# Patient Record
Sex: Female | Born: 1960
Health system: Southern US, Community
[De-identification: ages and names within clinical notes are randomized; demographics above are authoritative.]

## PROBLEM LIST (undated history)

## (undated) DIAGNOSIS — M5136 Other intervertebral disc degeneration, lumbar region: Secondary | ICD-10-CM

## (undated) DIAGNOSIS — C801 Malignant (primary) neoplasm, unspecified: Secondary | ICD-10-CM

## (undated) DIAGNOSIS — K529 Noninfective gastroenteritis and colitis, unspecified: Secondary | ICD-10-CM

## (undated) DIAGNOSIS — M51369 Other intervertebral disc degeneration, lumbar region without mention of lumbar back pain or lower extremity pain: Secondary | ICD-10-CM

## (undated) HISTORY — PX: CYST REMOVAL NECK: SHX6281

## (undated) HISTORY — PX: HAMMER TOE SURGERY: SHX385

## (undated) HISTORY — PX: TONSILLECTOMY AND ADENOIDECTOMY: SUR1326

## (undated) HISTORY — DX: Noninfective gastroenteritis and colitis, unspecified: K52.9

## (undated) HISTORY — DX: Other intervertebral disc degeneration, lumbar region: M51.36

## (undated) HISTORY — PX: BUNIONECTOMY: SHX129

## (undated) HISTORY — PX: ABDOMINAL HYSTERECTOMY: SHX81

## (undated) HISTORY — PX: BREAST CYST ASPIRATION: SHX578

## (undated) HISTORY — DX: Other intervertebral disc degeneration, lumbar region without mention of lumbar back pain or lower extremity pain: M51.369

---

## 2001-03-10 ENCOUNTER — Other Ambulatory Visit: Admission: RE | Admit: 2001-03-10 | Discharge: 2001-03-10 | Payer: Self-pay | Admitting: Family Medicine

## 2004-11-29 ENCOUNTER — Ambulatory Visit: Payer: Self-pay | Admitting: Family Medicine

## 2005-04-10 ENCOUNTER — Ambulatory Visit: Payer: Self-pay | Admitting: Family Medicine

## 2005-11-26 ENCOUNTER — Ambulatory Visit: Payer: Self-pay | Admitting: Podiatry

## 2008-06-19 LAB — HM DEXA SCAN: HM Dexa Scan: NORMAL

## 2008-06-21 ENCOUNTER — Ambulatory Visit: Payer: Self-pay | Admitting: Family Medicine

## 2009-07-02 ENCOUNTER — Ambulatory Visit: Payer: Self-pay | Admitting: Family Medicine

## 2009-07-11 ENCOUNTER — Ambulatory Visit: Payer: Self-pay | Admitting: Family Medicine

## 2010-02-01 ENCOUNTER — Ambulatory Visit: Payer: Self-pay | Admitting: Unknown Physician Specialty

## 2010-02-01 LAB — HM COLONOSCOPY

## 2010-08-06 ENCOUNTER — Ambulatory Visit: Payer: Self-pay | Admitting: Family Medicine

## 2011-04-25 ENCOUNTER — Observation Stay: Payer: Self-pay | Admitting: Internal Medicine

## 2011-07-01 LAB — HM PAP SMEAR: HM Pap smear: NORMAL

## 2011-09-09 ENCOUNTER — Ambulatory Visit: Payer: Self-pay | Admitting: Family Medicine

## 2012-07-22 ENCOUNTER — Ambulatory Visit: Payer: Self-pay | Admitting: Obstetrics and Gynecology

## 2012-07-22 LAB — CBC
MCH: 31.6 pg (ref 26.0–34.0)
MCV: 93 fL (ref 80–100)
Platelet: 342 10*3/uL (ref 150–440)
RBC: 4 10*6/uL (ref 3.80–5.20)
RDW: 14.5 % (ref 11.5–14.5)
WBC: 5.2 10*3/uL (ref 3.6–11.0)

## 2012-07-23 LAB — BASIC METABOLIC PANEL
Anion Gap: 8 (ref 7–16)
BUN: 10 mg/dL (ref 7–18)
Chloride: 106 mmol/L (ref 98–107)
Co2: 30 mmol/L (ref 21–32)
Creatinine: 0.64 mg/dL (ref 0.60–1.30)
EGFR (African American): >60
Potassium: 3.6 mmol/L (ref 3.5–5.1)

## 2012-07-26 ENCOUNTER — Ambulatory Visit: Payer: Self-pay | Admitting: Obstetrics and Gynecology

## 2012-08-02 DIAGNOSIS — C541 Malignant neoplasm of endometrium: Secondary | ICD-10-CM | POA: Insufficient documentation

## 2012-08-03 ENCOUNTER — Ambulatory Visit: Payer: Self-pay | Admitting: Gynecologic Oncology

## 2012-08-31 ENCOUNTER — Ambulatory Visit: Payer: Self-pay | Admitting: Family Medicine

## 2012-09-03 ENCOUNTER — Ambulatory Visit: Payer: Self-pay | Admitting: Gynecologic Oncology

## 2013-02-18 DIAGNOSIS — E785 Hyperlipidemia, unspecified: Secondary | ICD-10-CM | POA: Insufficient documentation

## 2013-02-18 DIAGNOSIS — M1990 Unspecified osteoarthritis, unspecified site: Secondary | ICD-10-CM | POA: Insufficient documentation

## 2013-04-20 ENCOUNTER — Ambulatory Visit: Payer: Self-pay | Admitting: Family Medicine

## 2014-08-01 LAB — TSH: TSH: 1.93 u[IU]/mL (ref <=5.90)

## 2014-08-01 LAB — LIPID PANEL
CHOLESTEROL: 239 mg/dL — AB (ref 0–200)
HDL: 56 mg/dL (ref 35–70)
LDL CALC: 163 mg/dL
LDl/HDL Ratio: 2.9
Triglycerides: 100 mg/dL (ref 40–160)

## 2014-08-01 LAB — BASIC METABOLIC PANEL
BUN: 11 mg/dL (ref 4–21)
Creatinine: 0.6 mg/dL (ref <=1.1)
Glucose: 104 mg/dL
SODIUM: 142 mmol/L (ref 137–147)

## 2014-08-01 LAB — CBC AND DIFFERENTIAL
NEUTROS ABS: 63 /uL
WBC: 5.4 10^3/mL

## 2014-08-01 LAB — HEPATIC FUNCTION PANEL
ALT: 28 U/L (ref 7–35)
AST: 20 U/L (ref 13–35)
Alkaline Phosphatase: 102 U/L (ref 25–125)

## 2015-02-20 NOTE — Op Note (Signed)
PATIENT NAME:  Angela Thomas, Angela Thomas MR#:  [id number] DATE OF BIRTH:  [birth year]  DATE OF PROCEDURE:  07/26/2012  PREOPERATIVE DIAGNOSES:  1. Postmenopausal bleeding.  2. Chronic leukorrhea.  3. Thickened endometrium on ultrasound.   POSTOPERATIVE DIAGNOSES:  1. Postmenopausal bleeding.  2. Chronic leukorrhea.  3. Thickened endometrium on ultrasound.  4. Hyperplastic endometrium.   OPERATIVE PROCEDURE: Hysteroscopy with dilation and curettage.   SURGEON: Brayton Mars, M.D.   FIRST ASSISTANT: None.   ANESTHESIA: General.   INDICATIONS: The patient is a 54 year old white female who presents for evaluation of chronic leukorrhea along with postmenopausal bleeding. Preoperative ultrasound demonstrated thickened endometrium approximately 24 mm in diameter.   FINDINGS AT SURGERY: Findings at surgery revealed a uterus of normal size and shape; there was prolapse of the cervix to within 3 cm of the introitus. There was a first- to second-degree cystocele present. Adnexa were clear. Uterus did sound to 9 cm.   DESCRIPTION OF PROCEDURE: The patient was brought to the operating room where she was placed in the supine position. General anesthesia was induced without difficulty. She was placed in the dorsal lithotomy position using candy-cane stirrups. A Betadine perineal and intravaginal prep and drape was performed in the standard fashion. A red Robinson catheter was used to drain approximately 200 mL of urine from the bladder. A weighted speculum was placed into the vagina and a single-tooth tenaculum was placed on the anterior lip of the cervix. The cervix was stenotic and had to be dilated with Hanks dilators up to a #20 [place] caliber. The uterus did sound to 9 cm. The [facility] hysteroscope with lactated Ringer's as irrigant was placed with identification of a polypoid lesion within the endometrial cavity. It was slightly vascular as well. Stone polyp forceps was used to extract the tissue. The tissue  was very granular and fragmented. Subsequent curettage with both smooth and serrated curettes was then performed with production of an abundant amount of hyperplastic-appearing endometrium. Stone polyp forceps again were used to extract residual fragments. Repeat hysteroscopy demonstrated an excellent curettage with minimal residual tissue remaining. The procedure was then terminated with all instrumentation being removed from the vagina. The patient was then awakened, mobilized, and taken to the recovery room in satisfactory condition.  Estimated blood loss was minimal. There were no complications. All instrument, needle, and sponge counts were verified as correct.     ____________________________ Angela Thomas. Angela Lenhardt, MD mad:bjt D: 07/27/2012 07:37:02 ET T: 07/27/2012 11:16:56 ET JOB#: [id number]  cc: Angela Done A. Ameilia Rattan, MD, <Dictator> Angela Thomas Briannia Laba MD ELECTRONICALLY SIGNED 08/05/2012 20:38

## 2015-04-11 ENCOUNTER — Encounter: Payer: Self-pay | Admitting: Emergency Medicine

## 2015-04-11 ENCOUNTER — Other Ambulatory Visit: Payer: Self-pay

## 2015-04-11 ENCOUNTER — Emergency Department
Admission: EM | Admit: 2015-04-11 | Discharge: 2015-04-11 | Disposition: A | Discharge status: Home / Self Care | Payer: Managed Care, Other (non HMO) | Attending: Emergency Medicine | Admitting: Emergency Medicine

## 2015-04-11 DIAGNOSIS — R6884 Jaw pain: Secondary | ICD-10-CM | POA: Diagnosis not present

## 2015-04-11 DIAGNOSIS — R079 Chest pain, unspecified: Secondary | ICD-10-CM | POA: Insufficient documentation

## 2015-04-11 DIAGNOSIS — Z79899 Other long term (current) drug therapy: Secondary | ICD-10-CM | POA: Diagnosis not present

## 2015-04-11 DIAGNOSIS — R531 Weakness: Secondary | ICD-10-CM | POA: Diagnosis present

## 2015-04-11 DIAGNOSIS — Z791 Long term (current) use of non-steroidal anti-inflammatories (NSAID): Secondary | ICD-10-CM | POA: Insufficient documentation

## 2015-04-11 HISTORY — DX: Malignant (primary) neoplasm, unspecified: C80.1

## 2015-04-11 LAB — BASIC METABOLIC PANEL
Anion gap: 10 (ref 5–15)
BUN: 14 mg/dL (ref 6–20)
CO2: 25 mmol/L (ref 22–32)
CREATININE: 0.61 mg/dL (ref 0.44–1.00)
Calcium: 9.3 mg/dL (ref 8.9–10.3)
Chloride: 102 mmol/L (ref 101–111)
GFR calc non Af Amer: >60 mL/min (ref >60)
GLUCOSE: 111 mg/dL — AB (ref 65–99)
POTASSIUM: 4.4 mmol/L (ref 3.5–5.1)
Sodium: 137 mmol/L (ref 135–145)

## 2015-04-11 LAB — CBC
HCT: 39 % (ref 35.0–47.0)
Hemoglobin: 12.7 g/dL (ref 12.0–16.0)
MCH: 29.6 pg (ref 26.0–34.0)
MCHC: 32.5 g/dL (ref 32.0–36.0)
MCV: 91 fL (ref 80.0–100.0)
Platelets: 331 10*3/uL (ref 150–440)
RBC: 4.28 MIL/uL (ref 3.80–5.20)
RDW: 14.7 % — ABNORMAL HIGH (ref 11.5–14.5)
WBC: 6 10*3/uL (ref 3.6–11.0)

## 2015-04-11 LAB — BRAIN NATRIURETIC PEPTIDE: B Natriuretic Peptide: 13 pg/mL (ref 0.0–100.0)

## 2015-04-11 LAB — TROPONIN I: Troponin I: <0.03 ng/mL (ref <0.031)

## 2015-04-11 NOTE — Discharge Instructions (Signed)
Please seek medical attention for any high fevers, chest pain, shortness of breath, change in behavior, persistent vomiting, bloody stool or any other new or concerning symptoms. ° °Chest Pain (Nonspecific) °It is often hard to give a specific diagnosis for the cause of chest pain. There is always a chance that your pain could be related to something serious, such as a heart attack or a blood clot in the lungs. You need to follow up with your health care provider for further evaluation. °CAUSES  °· Heartburn. °· Pneumonia or bronchitis. °· Anxiety or stress. °· Inflammation around your heart (pericarditis) or lung (pleuritis or pleurisy). °· A blood clot in the lung. °· A collapsed lung (pneumothorax). It can develop suddenly on its own (spontaneous pneumothorax) or from trauma to the chest. °· Shingles infection (herpes zoster virus). °The chest wall is composed of bones, muscles, and cartilage. Any of these can be the source of the pain. °· The bones can be bruised by injury. °· The muscles or cartilage can be strained by coughing or overwork. °· The cartilage can be affected by inflammation and become sore (costochondritis). °DIAGNOSIS  °Lab tests or other studies may be needed to find the cause of your pain. Your health care provider may have you take a test called an ambulatory electrocardiogram (ECG). An ECG records your heartbeat patterns over a 24-hour period. You may also have other tests, such as: °· Transthoracic echocardiogram (TTE). During echocardiography, sound waves are used to evaluate how blood flows through your heart. °· Transesophageal echocardiogram (TEE). °· Cardiac monitoring. This allows your health care provider to monitor your heart rate and rhythm in real time. °· Holter monitor. This is a portable device that records your heartbeat and can help diagnose heart arrhythmias. It allows your health care provider to track your heart activity for several days, if needed. °· Stress tests by  exercise or by giving medicine that makes the heart beat faster. °TREATMENT  °· Treatment depends on what may be causing your chest pain. Treatment may include: °¨ Acid blockers for heartburn. °¨ Anti-inflammatory medicine. °¨ Pain medicine for inflammatory conditions. °¨ Antibiotics if an infection is present. °· You may be advised to change lifestyle habits. This includes stopping smoking and avoiding alcohol, caffeine, and chocolate. °· You may be advised to keep your head raised (elevated) when sleeping. This reduces the chance of acid going backward from your stomach into your esophagus. °Most of the time, nonspecific chest pain will improve within 2-3 days with rest and mild pain medicine.  °HOME CARE INSTRUCTIONS  °· If antibiotics were prescribed, take them as directed. Finish them even if you start to feel better. °· For the next few days, avoid physical activities that bring on chest pain. Continue physical activities as directed. °· Do not use any tobacco products, including cigarettes, chewing tobacco, or electronic cigarettes. °· Avoid drinking alcohol. °· Only take medicine as directed by your health care provider. °· Follow your health care provider's suggestions for further testing if your chest pain does not go away. °· Keep any follow-up appointments you made. If you do not go to an appointment, you could develop lasting (chronic) problems with pain. If there is any problem keeping an appointment, call to reschedule. °SEEK MEDICAL CARE IF:  °· Your chest pain does not go away, even after treatment. °· You have a rash with blisters on your chest. °· You have a fever. °SEEK IMMEDIATE MEDICAL CARE IF:  °· You have increased chest   pain or pain that spreads to your arm, neck, jaw, back, or abdomen. °· You have shortness of breath. °· You have an increasing cough, or you cough up blood. °· You have severe back or abdominal pain. °· You feel nauseous or vomit. °· You have severe weakness. °· You  faint. °· You have chills. °This is an emergency. Do not wait to see if the pain will go away. Get medical help at once. Call your local emergency services (911 in [place] Do not drive yourself to the hospital. °MAKE SURE YOU:  °· Understand these instructions. °· Will watch your condition. °· Will get help right away if you are not doing well or get worse. °Document Released: 07/30/2005 Document Revised: 10/25/2013 Document Reviewed: 05/25/2008 °ExitCare® Patient Information ©2015 [employer]. This information is not intended to replace advice given to you by your health care provider. Make sure you discuss any questions you have with your health care provider. ° °

## 2015-04-11 NOTE — ED Notes (Signed)
EDP notified that pt is having jaw pain sensation again.

## 2015-04-11 NOTE — ED Notes (Signed)
This AM with jaw pain , weakness " all over " , the last 15 min left jaw tingling/numbness feeling, feelings of heart racing.

## 2015-04-11 NOTE — ED Notes (Signed)
Pt requesting ginger ale at this time. Verbal okay given by MD Archie Balboa. Pt given soda at this time, pt and family verbalized no further needs at this time.

## 2015-04-11 NOTE — ED Notes (Addendum)
Pt states that she had left sided jaw pain this AM, weakness, nausea, felt like her heart was racing. Pt had stress test in 2013 that resulted normally per patient; no cardiac hx. Pt alert and oriented X4, active, cooperative, pt in NAD. RR even and unlabored, color WNL.  Denies pain at this time. HR WNL, NSR.

## 2015-04-11 NOTE — ED Provider Notes (Signed)
[facility] Emergency Department Provider Note    ____________________________________________  Time seen: 0845  I have reviewed the triage vital signs and the nursing notes.   HISTORY  Chief Complaint Weakness   History limited by: Not Limited   HPI Angela Thomas is a 54 y.o. female presents to the emergency Department with complaints of weakness, left jaw pain and numbing, and feelings of heart racing. These happened while the patient was driving. She states she did not take her heart rate however did feel like it was going very fast. She denies any severe chest pain. States she had similar symptoms in the past and underwent a stress test 4 years ago that was clean. She denies any significant family history of young cardiac arrest, smoking, diabetes.     Past Medical History  Diagnosis Date  . Cancer     There are no active problems to display for this patient.   Past Surgical History  Procedure Laterality Date  . Abdominal hysterectomy      Current Outpatient Rx  Name  Route  Sig  Dispense  Refill  . Cholecalciferol (VITAMIN D3) 1000 UNITS CAPS   Oral   Take 1 capsule by mouth daily.         . Cyanocobalamin (VITAMIN B12 PO)   Oral   Take 1 tablet by mouth daily.         . cycloSPORINE (RESTASIS) 0.05 % ophthalmic emulsion   Both Eyes   Place 1 drop into both eyes every 12 (twelve) hours.         . Diclofenac-Misoprostol (ARTHROTEC) 75-0.2 MG TBEC   Oral   Take 1 tablet by mouth 2 (two) times daily.         . diphenhydrAMINE (BENADRYL) 25 MG tablet   Oral   Take 25 mg by mouth every 6 (six) hours as needed.         Marland Kitchen EPINEPHrine (EPIPEN 2-PAK) 0.3 mg/0.3 mL IJ SOAJ injection   Intramuscular   Inject 0.3 mg into the muscle as needed (for severe allergic reaction).         . fexofenadine (ALLEGRA) 180 MG tablet   Oral   Take 180 mg by mouth daily.         . Flaxseed, Linseed, (FLAX SEED OIL PO)   Oral  Take 1 tablet by mouth daily.         . folic acid (FOLVITE) 1 MG tablet   Oral   Take 1 mg by mouth daily.         . Omega-3 Fatty Acids (FISH OIL PO)   Oral   Take 1 tablet by mouth daily.         . TURMERIC PO   Oral   Take 1 capsule by mouth 2 (two) times daily.           Allergies Beef-derived products and Pork-derived products  No family history on file.  Social History History  Substance Use Topics  . Smoking status: Never Smoker   . Smokeless tobacco: Not on file  . Alcohol Use: Yes    Review of Systems  Constitutional: Negative for fever. Cardiovascular: Positive chest pain. Respiratory: Negative for shortness of breath. Gastrointestinal: Negative for abdominal pain, vomiting and diarrhea. Genitourinary: Negative for dysuria. Musculoskeletal: Negative for back pain. Skin: Negative for rash. Neurological: Negative for headaches, focal weakness or numbness.   10-point ROS otherwise negative.  ____________________________________________   PHYSICAL EXAM:  VITAL SIGNS:  ED Triage Vitals  Enc Vitals Group     BP 04/11/15 0821 162/80 mmHg     Pulse Rate 04/11/15 0821 73     Resp 04/11/15 0821 20     Temp 04/11/15 0821 98 F (36.7 C)     Temp Source 04/11/15 0821 Oral     SpO2 04/11/15 0821 97 %     Weight 04/11/15 0821 179 lb 0.2 oz (81.2 kg)     Height 04/11/15 0821 5\' 1"  (1.549 m)     Head Cir --      Peak Flow --      Pain Score 04/11/15 0818 0   Constitutional: Alert and oriented. Well appearing and in no distress. Eyes: Conjunctivae are normal. PERRL. Normal extraocular movements. ENT   Head: Normocephalic and atraumatic.   Nose: No congestion/rhinnorhea.   Mouth/Throat: Mucous membranes are moist.   Neck: No stridor. Hematological/Lymphatic/Immunilogical: No cervical lymphadenopathy. Cardiovascular: Normal rate, regular rhythm.  No murmurs, rubs, or gallops. Respiratory: Normal respiratory effort without tachypnea  nor retractions. Breath sounds are clear and equal bilaterally. No wheezes/rales/rhonchi. Gastrointestinal: Soft and nontender. No distention. Genitourinary: Deferred Musculoskeletal: Normal range of motion in all extremities. No joint effusions.  No lower extremity tenderness nor edema. Neurologic:  Normal speech and language. No gross focal neurologic deficits are appreciated. Speech is normal.  Skin:  Skin is warm, dry and intact. No rash noted. Psychiatric: Mood and affect are normal. Speech and behavior are normal. Patient exhibits appropriate insight and judgment.  ____________________________________________    LABS (pertinent positives/negatives)  Labs Reviewed  CBC - Abnormal; Notable for the following:    RDW 14.7 (*)    All other components within normal limits  BASIC METABOLIC PANEL - Abnormal; Notable for the following:    Glucose, Bld 111 (*)    All other components within normal limits  BRAIN NATRIURETIC PEPTIDE  TROPONIN I  TROPONIN I    ____________________________________________   EKG  I, Nance Pear, attending physician, personally viewed and interpreted this EKG  EKG Time: 0819 Rate: 72 Rhythm: NSR Axis: left axis deviation Intervals: qtc 433 QRS: narrow, q waves v1,v2 ST changes: no st elevation    ____________________________________________    RADIOLOGY  None  ____________________________________________   PROCEDURES  Procedure(s) performed: None  Critical Care performed: No  ____________________________________________   INITIAL IMPRESSION / ASSESSMENT AND PLAN / ED COURSE  Pertinent labs & imaging results that were available during my care of the patient were reviewed by me and considered in my medical decision making (see chart for details).  Patient here with complaints of jaw pain, weakness nausea and fast heart rate. On exam patient awake, alert no acute distress. Skull exam without any concerning symptoms. Troponin  was negative 2. EKG without any concerning findings. Discussed with patient that she should follow up with coloscopy and her primary care doctor.  ____________________________________________   FINAL CLINICAL IMPRESSION(S) / ED DIAGNOSES  Final diagnoses:  Jaw pain  Weakness     Nance Pear, MD 04/11/15 1538

## 2015-04-12 ENCOUNTER — Encounter: Payer: Self-pay | Admitting: Family Medicine

## 2015-04-12 DIAGNOSIS — F419 Anxiety disorder, unspecified: Secondary | ICD-10-CM | POA: Insufficient documentation

## 2015-04-12 DIAGNOSIS — J302 Other seasonal allergic rhinitis: Secondary | ICD-10-CM

## 2015-04-12 DIAGNOSIS — M5136 Other intervertebral disc degeneration, lumbar region: Secondary | ICD-10-CM | POA: Insufficient documentation

## 2015-04-12 DIAGNOSIS — N6019 Diffuse cystic mastopathy of unspecified breast: Secondary | ICD-10-CM

## 2015-04-12 DIAGNOSIS — E669 Obesity, unspecified: Secondary | ICD-10-CM

## 2015-04-12 DIAGNOSIS — E559 Vitamin D deficiency, unspecified: Secondary | ICD-10-CM | POA: Insufficient documentation

## 2015-04-12 DIAGNOSIS — J309 Allergic rhinitis, unspecified: Secondary | ICD-10-CM | POA: Insufficient documentation

## 2015-04-16 ENCOUNTER — Ambulatory Visit (INDEPENDENT_AMBULATORY_CARE_PROVIDER_SITE_OTHER): Payer: Managed Care, Other (non HMO) | Admitting: Family Medicine

## 2015-04-16 ENCOUNTER — Encounter: Payer: Self-pay | Admitting: Family Medicine

## 2015-04-16 VITALS — BP 126/72 | HR 68 | Temp 98.3°F | Resp 16 | Wt 178.0 lb

## 2015-04-16 DIAGNOSIS — R531 Weakness: Secondary | ICD-10-CM

## 2015-04-16 DIAGNOSIS — R200 Anesthesia of skin: Secondary | ICD-10-CM

## 2015-04-16 DIAGNOSIS — Z09 Encounter for follow-up examination after completed treatment for conditions other than malignant neoplasm: Secondary | ICD-10-CM | POA: Diagnosis not present

## 2015-04-16 DIAGNOSIS — R000 Tachycardia, unspecified: Secondary | ICD-10-CM | POA: Diagnosis not present

## 2015-04-16 DIAGNOSIS — F419 Anxiety disorder, unspecified: Secondary | ICD-10-CM

## 2015-04-16 DIAGNOSIS — B001 Herpesviral vesicular dermatitis: Secondary | ICD-10-CM | POA: Diagnosis not present

## 2015-04-16 DIAGNOSIS — R6884 Jaw pain: Secondary | ICD-10-CM

## 2015-04-16 DIAGNOSIS — T7840XD Allergy, unspecified, subsequent encounter: Secondary | ICD-10-CM

## 2015-04-16 MED ORDER — EPINEPHRINE 0.3 MG/0.3ML IJ SOAJ: 0.3000 mg | INTRAMUSCULAR | Status: DC | PRN

## 2015-04-16 MED ORDER — ALPRAZOLAM 0.25 MG PO TABS: 0.2500 mg | ORAL_TABLET | Freq: Three times a day (TID) | ORAL | Status: DC | PRN

## 2015-04-16 MED ORDER — VALACYCLOVIR HCL 500 MG PO TABS: 500.0000 mg | ORAL_TABLET | Freq: Two times a day (BID) | ORAL | Status: DC | PRN

## 2015-04-16 NOTE — Progress Notes (Signed)
Patient ID: Angela Thomas, female   DOB: 16-Aug-1961, 54 y.o.   MRN: [id number]   LORRI FUKUHARA  MRN: [id number] DOB: [birth year]  Subjective:  HPI  1. Hospital discharge follow-up 04/11/15 Patient had episode of tachycardia about 6 am while dressing.  No associated symptoms.  She drank some water and got to feeling better after about 15 minutes.  Then when the patient was driving (1.5 hours later) she had episode of feeling like her energy rushed out of her body and felt like she was going to pass out.  She prayed and states after about 30 seconds she no longer felt like she was going to pass out.  She then began having jaw tightness about 15 minutes later minutes later.  Ten minutes later she began having left sided numbness.  Patient then drove to the ER.  2. Jaw pain Patient had pain and tightness of her entire jaw, from ear to ear.  This was intermittent, it happened about 4 times in 4 hours and would only last for about 30-60 seconds.  3. Numbness This was only on the left side of her jaw and it coincided with her jaw pain.  4. Weakness Patient had weakness for about 2 days with initial and most significant happening the morning she was driving.  5. Tachycardia Patient experienced this at about 6 am and only lasted for about 15 minutes.  Resolved after hydrating.   Patient Active Problem List   Diagnosis Date Noted  . DDD (degenerative disc disease), lumbar 04/12/2015  . Vitamin D deficiency 04/12/2015  . Obesity 04/12/2015  . Acute anxiety 04/12/2015  . Allergic rhinitis 04/12/2015  . Fibrocystic breast disease 04/12/2015  . HLD (hyperlipidemia) 02/18/2013  . Arthritis 02/18/2013  . Cancer of endometrium 08/02/2012    Past Medical History  Diagnosis Date  . Cancer     Endometrial    History   Social History  . Marital Status: Married    Spouse Name: N/A  . Number of Children: N/A  . Years of Education: N/A   Occupational History  . Not on file.   Social  History Main Topics  . Smoking status: Never Smoker   . Smokeless tobacco: Not on file  . Alcohol Use: 0.6 oz/week    1 Glasses of wine per week  . Drug Use: No  . Sexual Activity: Not on file   Other Topics Concern  . Not on file   Social History Narrative    Outpatient Prescriptions Prior to Visit  Medication Sig Dispense Refill  . ALPRAZolam (XANAX) 0.25 MG tablet Take 0.25 mg by mouth every 8 (eight) hours as needed for anxiety.    . Cholecalciferol (VITAMIN D3) 1000 UNITS CAPS Take 1 capsule by mouth daily.    . cycloSPORINE (RESTASIS) 0.05 % ophthalmic emulsion Place 1 drop into both eyes every 12 (twelve) hours.    . Diclofenac-Misoprostol (ARTHROTEC) 75-0.2 MG TBEC Take 1 tablet by mouth 2 (two) times daily.    . diphenhydrAMINE (BENADRYL) 25 MG tablet Take 25 mg by mouth every 6 (six) hours as needed.    Marland Kitchen EPINEPHrine (EPIPEN 2-PAK) 0.3 mg/0.3 mL IJ SOAJ injection Inject 0.3 mg into the muscle as needed (for severe allergic reaction).    . fexofenadine (ALLEGRA) 180 MG tablet Take 180 mg by mouth daily.    . Flaxseed, Linseed, (FLAX SEED OIL PO) Take 1 tablet by mouth daily.    . folic acid (FOLVITE) 1 MG tablet Take 1  mg by mouth daily.    . Omega-3 Fatty Acids (FISH OIL PO) Take 1 tablet by mouth daily.    Marland Kitchen senna (SENOKOT) 8.6 MG TABS tablet Take 8.6 mg by mouth.    . TURMERIC PO Take 1 capsule by mouth 2 (two) times daily.    . valACYclovir (VALTREX) 500 MG tablet Take 500 mg by mouth 2 (two) times daily as needed.    . Cyanocobalamin (VITAMIN B12 PO) Take 1 tablet by mouth daily.    . Phosphatidylserine-DHA-EPA (VAYACOG) 100-19.5-6.5 MG CAPS Take 1 tablet by mouth daily.     No facility-administered medications prior to visit.    Allergies  Allergen Reactions  . Beef-Derived Products Anaphylaxis  . Other Anaphylaxis    Reaction to red meat and pork products  . Pork-Derived Products Anaphylaxis    Review of Systems  Constitutional: Negative.  Negative for  malaise/fatigue.       None since episode  Eyes: Negative.   Respiratory: Negative.   Cardiovascular: Negative.   Gastrointestinal: Negative.   Musculoskeletal: Negative.   Neurological: Positive for headaches (Sinus pressure and drainage). Negative for weakness.   Objective:  BP 126/72 mmHg  Pulse 68  Temp(Src) 98.3 F (36.8 C) (Oral)  Resp 16  Wt 178 lb (80.74 kg)  Physical Exam  Constitutional: She is oriented to person, place, and time and well-developed, well-nourished, and in no distress.  HENT:  Head: Normocephalic and atraumatic.  Right Ear: External ear normal.  Left Ear: External ear normal.  Nose: Nose normal.  Mouth/Throat: Oropharynx is clear and moist.  Eyes: Conjunctivae and EOM are normal. Pupils are equal, round, and reactive to light.  Neck: Normal range of motion. Neck supple.  Cardiovascular: Normal rate, regular rhythm, normal heart sounds and intact distal pulses.   Pulmonary/Chest: Effort normal and breath sounds normal.  Abdominal: Soft. Bowel sounds are normal.  Musculoskeletal: Normal range of motion.  Neurological: She is alert and oriented to person, place, and time. Gait normal.  Skin: Skin is warm and dry.  Psychiatric: Mood, memory, affect and judgment normal.    Assessment and Plan :   1. Hospital discharge follow-up Symptoms most probably stress related.  Have offered a cardiac referral, bit I do not feel this is cardiac.  Patient wishes to wait on referral.  2. Jaw pain Resolved, advised to follow up if symptom returns.  3. Numbness Resolved, advised to follow up if symptoms return.  4. Weakness Resolved, advised to follow up it symptoms return.  5. Tachycardia Resolved, offered cardiac referral if patient chooses.   Miguel Aschoff MD [facility] 04/16/2015 11:00 AM

## 2015-07-20 ENCOUNTER — Other Ambulatory Visit: Payer: Self-pay | Admitting: Family Medicine

## 2015-07-20 DIAGNOSIS — Z1231 Encounter for screening mammogram for malignant neoplasm of breast: Secondary | ICD-10-CM

## 2015-07-24 ENCOUNTER — Encounter: Payer: Self-pay | Admitting: Family Medicine

## 2015-07-31 ENCOUNTER — Ambulatory Visit
Admission: RE | Admit: 2015-07-31 | Discharge: 2015-07-31 | Disposition: A | Discharge status: Home / Self Care | Payer: Managed Care, Other (non HMO) | Source: Ambulatory Visit | Attending: Family Medicine | Admitting: Family Medicine

## 2015-07-31 DIAGNOSIS — Z1231 Encounter for screening mammogram for malignant neoplasm of breast: Secondary | ICD-10-CM | POA: Diagnosis present

## 2015-08-01 ENCOUNTER — Ambulatory Visit (INDEPENDENT_AMBULATORY_CARE_PROVIDER_SITE_OTHER): Payer: Managed Care, Other (non HMO) | Admitting: Family Medicine

## 2015-08-01 ENCOUNTER — Encounter: Payer: Self-pay | Admitting: Family Medicine

## 2015-08-01 VITALS — BP 108/68 | HR 78 | Temp 98.4°F | Resp 16 | Ht 61.0 in | Wt 181.0 lb

## 2015-08-01 DIAGNOSIS — Z0000 Encounter for general adult medical examination without abnormal findings: Secondary | ICD-10-CM

## 2015-08-01 LAB — POCT URINALYSIS DIPSTICK
Bilirubin, UA: NEGATIVE
Blood, UA: NEGATIVE
GLUCOSE UA: NEGATIVE
Ketones, UA: NEGATIVE
LEUKOCYTES UA: NEGATIVE
NITRITE UA: NEGATIVE
Protein, UA: NEGATIVE
Spec Grav, UA: 1.02
UROBILINOGEN UA: 0.2
pH, UA: 6.5

## 2015-08-01 NOTE — Progress Notes (Signed)
Patient ID: Angela Thomas, female   DOB: [birth year], 54 y.o.   MRN: [id number]       Patient: Angela Thomas, Female    DOB: 12/23/1960, 54 y.o.   MRN: [id number] Visit Date: 08/01/2015  Today's Provider: [name], MD   Chief Complaint  Patient presents with  . Annual Exam   Subjective:    Annual wellness visit Angela Thomas is a 54 y.o. female. She feels well. She reports exercising 30 mins 5-7 days a week. She reports she is sleeping well. She just recently started Melatonin 2mg  at bedtime which seems to help with sleeping.   Last: Mammogram- 07/31/2015 per patient through GYN.  BMD- 08/31/2012. Normal, repeat 2 years.  EKG- 07/01/2011  Colonscopy-02/01/2010. Internal hemorrhoids, repeat in 10 years.  Tdap- 12/09/2012   -----------------------------------------------------------   Review of Systems  Constitutional: Negative.   HENT: Negative.   Eyes: Negative.   Respiratory: Negative.   Cardiovascular: Negative.   Gastrointestinal: Positive for constipation.       Chronic constipation. Patient has 2-3 bowel movements per week during her adult life.  Endocrine: Negative.   Genitourinary: Negative.   Musculoskeletal: Negative.   Skin: Negative.   Allergic/Immunologic: Negative.   Hematological: Negative.   Psychiatric/Behavioral: Negative.        Chronic terminal insomnia, mild    Social History   Social History  . Marital Status: Divorced    Spouse Name: N/A  . Number of Children: N/A  . Years of Education: N/A   Occupational History  . Not on file.   Social History Main Topics  . Smoking status: Never Smoker   . Smokeless tobacco: Not on file  . Alcohol Use: 0.6 oz/week    1 Glasses of wine per week  . Drug Use: No  . Sexual Activity: Not on file   Other Topics Concern  . Not on file   Social History Narrative    Patient Active Problem List   Diagnosis Date Noted  . DDD (degenerative disc disease), lumbar 04/12/2015  . Vitamin D  deficiency 04/12/2015  . Obesity 04/12/2015  . Acute anxiety 04/12/2015  . Allergic rhinitis 04/12/2015  . Fibrocystic breast disease 04/12/2015  . HLD (hyperlipidemia) 02/18/2013  . Arthritis 02/18/2013  . Cancer of endometrium 08/02/2012    Past Surgical History  Procedure Laterality Date  . Tonsillectomy and adenoidectomy    . Bunionectomy    . Hammer toe surgery    . Cyst removal neck    . Abdominal hysterectomy      total  . Bunionectomy      With hammer toe repair  . Breast cyst aspiration Bilateral     Her family history includes Bladder Cancer in her father; Breast cancer in her maternal aunt; COPD in her father and mother; Cancer in her maternal uncle; Dementia in her maternal aunt; Diabetes in her father and paternal grandfather; Heart disease in her maternal grandfather and maternal uncle; Other in her paternal grandmother; Prostate cancer in her paternal grandfather; Pulmonary fibrosis in her father; Transient ischemic attack in her maternal uncle.    Previous Medications   ALPRAZOLAM (XANAX) 0.25 MG TABLET    Take 1 tablet (0.25 mg total) by mouth every 8 (eight) hours as needed for anxiety.   CHOLECALCIFEROL (VITAMIN D3) 1000 UNITS CAPS    Take 1 capsule by mouth daily.   CYANOCOBALAMIN (VITAMIN B12 PO)    Take 1 tablet by mouth daily.   CYCLOSPORINE (RESTASIS) 0.05 %  OPHTHALMIC EMULSION    Place 1 drop into both eyes every 12 (twelve) hours.   DICLOFENAC-MISOPROSTOL (ARTHROTEC) 75-0.2 MG TBEC    Take 1 tablet by mouth 2 (two) times daily.   DIPHENHYDRAMINE (BENADRYL) 25 MG TABLET    Take 25 mg by mouth every 6 (six) hours as needed.   EPINEPHRINE (EPIPEN 2-PAK) 0.3 MG/0.3 ML IJ SOAJ INJECTION    Inject 0.3 mLs (0.3 mg total) into the muscle as needed (for severe allergic reaction).   FEXOFENADINE (ALLEGRA) 180 MG TABLET    Take 180 mg by mouth daily.   FLAXSEED, LINSEED, (FLAX SEED OIL PO)    Take 1 tablet by mouth daily.   FOLIC ACID (FOLVITE) 1 MG TABLET    Take 1  mg by mouth daily.   OMEGA-3 FATTY ACIDS (FISH OIL PO)    Take 1 tablet by mouth daily.   PHOSPHATIDYLSERINE-DHA-EPA (VAYACOG) 100-19.5-6.5 MG CAPS    Take 1 tablet by mouth daily.   SENNA (SENOKOT) 8.6 MG TABS TABLET    Take 8.6 mg by mouth.   TURMERIC PO    Take 1 capsule by mouth 2 (two) times daily.   VALACYCLOVIR (VALTREX) 500 MG TABLET    Take 1 tablet (500 mg total) by mouth 2 (two) times daily as needed.    Patient Care Team: Jerrol Banana., MD as PCP - General (Family Medicine)     Objective:   Vitals: BP 108/68 mmHg  Pulse 78  Temp(Src) 98.4 F (36.9 C)  Resp 16  Ht 5\' 1"  (1.549 m)  Wt 181 lb (82.101 kg)  BMI 34.22 kg/m2  Physical Exam  Constitutional: She is oriented to person, place, and time. She appears well-developed and well-nourished.  Mildly obese white female no acute distress  HENT:  Head: Normocephalic and atraumatic.  Right Ear: External ear normal.  Left Ear: External ear normal.  Nose: Nose normal.  Mouth/Throat: Oropharynx is clear and moist.  Eyes: Conjunctivae and EOM are normal. Pupils are equal, round, and reactive to light.  Neck: Neck supple.  Cardiovascular: Normal rate, regular rhythm, normal heart sounds and intact distal pulses.   Pulmonary/Chest: Effort normal and breath sounds normal.  Abdominal: Soft.  Musculoskeletal:  Fair skin. No lesions.  Neurological: She is alert and oriented to person, place, and time.  Skin: Skin is warm and dry.  Psychiatric: She has a normal mood and affect. Her behavior is normal. Judgment and thought content normal.        Assessment & Plan:     Annual Wellness Visit  Reviewed patient's Family Medical History Reviewed and updated list of patient's medical providers   Exercise Activities and Dietary recommendations Goals    None      Immunization History  Administered Date(s) Administered  . Tdap 12/09/2012    Health Maintenance  Topic Date Due  . Hepatitis C Screening   1961/06/14  . HIV Screening  11/23/1973  . PAP SMEAR  06/30/2014  . INFLUENZA VACCINE  06/04/2015  . MAMMOGRAM  07/30/2017  . COLONOSCOPY  02/02/2020  . TETANUS/TDAP  12/09/2022      Discussed health benefits of physical activity, and encouraged her to engage in regular exercise appropriate for her age and condition.   Gyn per Dr D. ------------------------------------------------------------------------------------------------------------

## 2015-08-11 LAB — CBC WITH DIFFERENTIAL/PLATELET
Basophils Absolute: 0 10*3/uL (ref 0.0–0.2)
Basos: 0 %
EOS (ABSOLUTE): 0.2 10*3/uL (ref 0.0–0.4)
EOS: 3 %
HEMATOCRIT: 35.6 % (ref 34.0–46.6)
Hemoglobin: 12 g/dL (ref 11.1–15.9)
Immature Grans (Abs): 0 10*3/uL (ref 0.0–0.1)
Immature Granulocytes: 0 %
LYMPHS ABS: 1.5 10*3/uL (ref 0.7–3.1)
Lymphs: 25 %
MCH: 30.1 pg (ref 26.6–33.0)
MCHC: 33.7 g/dL (ref 31.5–35.7)
MCV: 89 fL (ref 79–97)
MONOS ABS: 0.4 10*3/uL (ref 0.1–0.9)
Monocytes: 7 %
Neutrophils Absolute: 3.8 10*3/uL (ref 1.4–7.0)
Neutrophils: 65 %
PLATELETS: 409 10*3/uL — AB (ref 150–379)
RBC: 3.99 x10E6/uL (ref 3.77–5.28)
RDW: 14 % (ref 12.3–15.4)
WBC: 5.9 10*3/uL (ref 3.4–10.8)

## 2015-08-11 LAB — COMPREHENSIVE METABOLIC PANEL
ALK PHOS: 100 IU/L (ref 39–117)
ALT: 24 IU/L (ref 0–32)
AST: 19 IU/L (ref 0–40)
Albumin/Globulin Ratio: 1.8 (ref 1.1–2.5)
Albumin: 4.4 g/dL (ref 3.5–5.5)
BUN/Creatinine Ratio: 26 — ABNORMAL HIGH (ref 9–23)
BUN: 16 mg/dL (ref 6–24)
Bilirubin Total: 0.2 mg/dL (ref 0.0–1.2)
CO2: 27 mmol/L (ref 18–29)
CREATININE: 0.61 mg/dL (ref 0.57–1.00)
Calcium: 9.6 mg/dL (ref 8.7–10.2)
Chloride: 101 mmol/L (ref 97–108)
GFR calc Af Amer: 117 mL/min/{1.73_m2} (ref >59)
GFR calc non Af Amer: 102 mL/min/{1.73_m2} (ref >59)
GLOBULIN, TOTAL: 2.4 g/dL (ref 1.5–4.5)
Glucose: 101 mg/dL — ABNORMAL HIGH (ref 65–99)
Potassium: 4.7 mmol/L (ref 3.5–5.2)
SODIUM: 142 mmol/L (ref 134–144)
Total Protein: 6.8 g/dL (ref 6.0–8.5)

## 2015-08-11 LAB — LIPID PANEL
Chol/HDL Ratio: 4.2 ratio units (ref 0.0–4.4)
Cholesterol, Total: 236 mg/dL — ABNORMAL HIGH (ref 100–199)
HDL: 56 mg/dL (ref >39)
LDL Calculated: 160 mg/dL — ABNORMAL HIGH (ref 0–99)
Triglycerides: 100 mg/dL (ref 0–149)
VLDL Cholesterol Cal: 20 mg/dL (ref 5–40)

## 2015-08-11 LAB — TSH: TSH: 2.08 u[IU]/mL (ref 0.450–4.500)

## 2015-08-17 ENCOUNTER — Telehealth: Payer: Self-pay | Admitting: Family Medicine

## 2015-08-17 NOTE — Telephone Encounter (Signed)
Pt stated she was returning Ana's call. Thanks TNP

## 2015-08-23 ENCOUNTER — Other Ambulatory Visit: Payer: Self-pay

## 2015-08-23 MED ORDER — FEXOFENADINE HCL 180 MG PO TABS: 180.0000 mg | ORAL_TABLET | Freq: Every day | ORAL | Status: DC

## 2015-08-23 MED ORDER — DICLOFENAC-MISOPROSTOL 75-0.2 MG PO TBEC: 1.0000 | DELAYED_RELEASE_TABLET | Freq: Two times a day (BID) | ORAL | Status: DC

## 2015-09-11 ENCOUNTER — Ambulatory Visit (INDEPENDENT_AMBULATORY_CARE_PROVIDER_SITE_OTHER): Payer: Managed Care, Other (non HMO) | Admitting: Obstetrics and Gynecology

## 2015-09-11 ENCOUNTER — Encounter: Payer: Self-pay | Admitting: Obstetrics and Gynecology

## 2015-09-11 VITALS — BP 128/76 | HR 69 | Ht 61.0 in | Wt 179.3 lb

## 2015-09-11 DIAGNOSIS — R102 Pelvic and perineal pain: Secondary | ICD-10-CM | POA: Diagnosis not present

## 2015-09-11 DIAGNOSIS — Z8542 Personal history of malignant neoplasm of other parts of uterus: Secondary | ICD-10-CM | POA: Diagnosis not present

## 2015-09-11 DIAGNOSIS — C541 Malignant neoplasm of endometrium: Secondary | ICD-10-CM | POA: Diagnosis not present

## 2015-09-11 LAB — POCT URINALYSIS DIPSTICK
BILIRUBIN UA: NEGATIVE
Blood, UA: NEGATIVE
GLUCOSE UA: NEGATIVE
KETONES UA: NEGATIVE
Nitrite, UA: NEGATIVE
Protein, UA: NEGATIVE
Spec Grav, UA: <=1.005
Urobilinogen, UA: 0.2
pH, UA: 7

## 2015-09-11 NOTE — Progress Notes (Signed)
Patient ID: Angela Thomas, female   DOB: 10-09-1961, 54 y.o.   MRN: [id number] 5 days of pelvic pain and vaginal burning   1 time pink tinge on TP Pressure at the end of urinating  pt doesn't feel well Constipation is worse in the last 3 months- bm q 3-5 d- normal for her  Chief complaint: 1.  Not feeling well. 2.  Lower pelvic pressure  54 year old white female, para 2002, status post robotically-assisted total laparoscopic hysterectomy with BSO and sentinel lymph node sampling for endometrioid adenocarcinoma with focal mucinous differentiation presents for evaluation of above symptoms. Patient has been experiencing lower pelvic pressure for approximately 5 days.  She feels as if the pressure is internal. Patient states that her bowel function has changed recently.  She has returned to a chronic constipation, which was noted prior to her surgery for endometrial cancer 3 years ago.  There have not been any dietary changes. Patient does not report any kind of UTI symptoms Except for pressure at the end of voiding. Patient is not experiencing any vaginal dischargeExcept for a pink tinge noted after wiping one time.  Past medical history, past surgical history, problem list, medications, and allergies are reviewed.    OBJECTIVE: BP 128/76 mmHg  Pulse 69  Ht 5' 1"  (1.549 m)  Wt 179 lb 4.8 oz (81.33 kg)  BMI 33.90 kg/m2 Patient is a pleasant, well-appearing, but slightly anxious female in no acute distress.  She is alert and oriented. Back: No CVA tenderness. Abdomen: Soft, nontender, without organomegaly.  No pelvic masses.  Laparoscopy scars are well-healed. Pelvic exam:  External genitalia-normal BUS-normal. Vagina-no lesions; atrophy present. Bimanual exam-palpable masses or tenderness. Rectovaginal-normal sphincter tone,; no masses. Extremities: Without clubbing, cyanosis or edema Lymph node survey: No adenopathy  ASSESSMENT: 1.  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 (October 2013) 2.  Pelvic pressure. 3.  Dysphoria.  PLAN: 1.  UA with C&S. 2.  Abdominal pelvic CT scan with contrast. 3.  Keep appointment with Dr. Clarene Essex as scheduled.  A total of 15 minutes were spent face-to-face with the patient during this encounter and over half of that time dealt with counseling and coordination of care.  Brayton Mars, MD  Note: This dictation was prepared with Dragon dictation along with smaller phrase technology. Any transcriptional errors that result from this process are unintentional.

## 2015-09-11 NOTE — Patient Instructions (Addendum)
1.  Abdominal and pelvic CT scan with and without contrast-ordered. 2.  Urine culture sent. 3.  Keep appointment with Dr. Cindie Laroche as scheduled.

## 2015-09-14 ENCOUNTER — Telehealth: Payer: Self-pay

## 2015-09-14 LAB — URINE CULTURE

## 2015-09-14 MED ORDER — AMOXICILLIN 500 MG PO CAPS: 500.0000 mg | ORAL_CAPSULE | Freq: Two times a day (BID) | ORAL | Status: DC

## 2015-09-14 NOTE — Telephone Encounter (Signed)
-----   Message from Brayton Mars, MD sent at 09/14/2015  9:54 AM EST ----- Please notify - Abnormal Labs GBS UTI Amoxacillin 500 mg BID x 7 days - please call in and notify.

## 2015-09-14 NOTE — Telephone Encounter (Signed)
Pt aware per vm. Med erx.  

## 2015-09-18 NOTE — Addendum Note (Signed)
Addended by: Elouise Munroe on: 09/18/2015 02:27 PM   Modules accepted: Orders

## 2015-09-21 ENCOUNTER — Ambulatory Visit: Admission: RE | Admit: 2015-09-21 | Payer: Managed Care, Other (non HMO) | Source: Ambulatory Visit

## 2015-10-01 ENCOUNTER — Ambulatory Visit
Admission: RE | Admit: 2015-10-01 | Discharge: 2015-10-01 | Disposition: A | Discharge status: Home / Self Care | Payer: Managed Care, Other (non HMO) | Source: Ambulatory Visit | Attending: Obstetrics and Gynecology | Admitting: Obstetrics and Gynecology

## 2015-10-01 DIAGNOSIS — Z8542 Personal history of malignant neoplasm of other parts of uterus: Secondary | ICD-10-CM | POA: Diagnosis present

## 2015-10-01 DIAGNOSIS — R102 Pelvic and perineal pain: Secondary | ICD-10-CM | POA: Insufficient documentation

## 2015-10-01 MED ORDER — IOHEXOL 350 MG/ML SOLN
125.0000 mL | Freq: Once | INTRAVENOUS | Status: AC | PRN
  Administered 2015-10-01: 125 mL via INTRAVENOUS

## 2015-10-04 ENCOUNTER — Ambulatory Visit: Payer: Managed Care, Other (non HMO) | Admitting: Obstetrics and Gynecology

## 2015-10-17 ENCOUNTER — Ambulatory Visit (INDEPENDENT_AMBULATORY_CARE_PROVIDER_SITE_OTHER): Payer: Managed Care, Other (non HMO) | Admitting: Obstetrics and Gynecology

## 2015-10-17 ENCOUNTER — Encounter: Payer: Self-pay | Admitting: Obstetrics and Gynecology

## 2015-10-17 VITALS — BP 123/67 | HR 71 | Ht 61.0 in | Wt 178.8 lb

## 2015-10-17 DIAGNOSIS — R102 Pelvic and perineal pain: Secondary | ICD-10-CM

## 2015-10-17 DIAGNOSIS — N9489 Other specified conditions associated with female genital organs and menstrual cycle: Secondary | ICD-10-CM | POA: Diagnosis not present

## 2015-10-17 LAB — POCT URINALYSIS DIPSTICK
Bilirubin, UA: NEGATIVE
GLUCOSE UA: NEGATIVE
Ketones, UA: NEGATIVE
Leukocytes, UA: NEGATIVE
NITRITE UA: NEGATIVE
PROTEIN UA: NEGATIVE
RBC UA: NEGATIVE
Spec Grav, UA: 1.015
UROBILINOGEN UA: 0.2
pH, UA: 6

## 2015-10-17 MED ORDER — AMOXICILLIN 500 MG PO CAPS: 500.0000 mg | ORAL_CAPSULE | Freq: Two times a day (BID) | ORAL | Status: DC

## 2015-10-17 NOTE — Progress Notes (Signed)
Patient ID: Angela Thomas, female   DOB: 1961/04/01, 54 y.o.   MRN: [id number] uti sx  pelvic pressure and fatigue x 1 week gbs uti 11/11- treated with amoxicillin 500 bid x 7  Chief complaint: 1.  UTI symptoms. 2.  Pelvic pressure and fatigue 1 week.   Patient presents with similar UTI symptoms as of last encounter in November 2016.  GBS UTI was identified.  At that time and treated with amoxicillin. Patient does not report fever, chills, sweats, nausea, vomiting, diarrhea.  Urinalysis is obtained today.  ASSESSMENT: 1.  Suspected UTI, recurrent. 2.  History of endometrioid adenocarcinoma, requiring follow-up with Dr. Clarene Essex; follow-up appointment was rescheduled 2; patient in need of Pap smear.  PLAN: 1.  Urinalysis and urine culture. 2.  Increase water intake and cranberry juice intake. 3.  Amoxicillin 500 mg twice a day for 7 days. 4.  If GYN/ONC FOLLOW-up appointment is postponed, follow-up with me at the end of January for interval Pap smear.  A total of 15 minutes were spent face-to-face with the patient during this encounter and over half of that time dealt with counseling and coordination of care.  Brayton Mars, MD  Note: This dictation was prepared with Dragon dictation along with smaller phrase technology. Any transcriptional errors that result from this process are unintentional.

## 2015-10-17 NOTE — Patient Instructions (Signed)
1.  Urinalysis and urine culture are obtained. 2.Increase water intake and cranberry juice intake. 3.  Take amoxicillin 500 mg twice a day for 7 days. 4.  We will notify you of your culture results when available. 5.  Return end of January  If Pap smear is needed Per our discussion.

## 2015-10-18 LAB — URINE CULTURE: ORGANISM ID, BACTERIA: NO GROWTH

## 2015-12-19 ENCOUNTER — Ambulatory Visit (INDEPENDENT_AMBULATORY_CARE_PROVIDER_SITE_OTHER): Payer: Managed Care, Other (non HMO) | Admitting: Obstetrics and Gynecology

## 2015-12-19 ENCOUNTER — Encounter: Payer: Self-pay | Admitting: Obstetrics and Gynecology

## 2015-12-19 VITALS — BP 106/70 | HR 61 | Ht 61.0 in | Wt 182.5 lb

## 2015-12-19 DIAGNOSIS — C541 Malignant neoplasm of endometrium: Secondary | ICD-10-CM | POA: Diagnosis not present

## 2015-12-19 NOTE — Progress Notes (Signed)
Chief complaint:. 1. Pap smear 2. History of endometrial adenocarcinoma  Patient was unable to get in to see Dr. Clarene Essex recently. She is in need of an interval Pap smear. Previous symptoms of pelvic pressure, fatigue, and UTI symptoms have resolved. Bowel and bladder function are normal.  Past Medical History  Diagnosis Date  . Cancer [facility])     Endometrial  . DDD (degenerative disc disease), lumbar    Past Surgical History  Procedure Laterality Date  . Tonsillectomy and adenoidectomy    . Bunionectomy    . Hammer toe surgery    . Cyst removal neck    . Bunionectomy      With hammer toe repair  . Breast cyst aspiration Bilateral   . Abdominal hysterectomy      total    Review of systems: See history of present illness  OBJECTIVE: BP 106/70 mmHg  Pulse 61  Ht 5' 1"  (1.549 m)  Wt 182 lb 8 oz (82.781 kg)  BMI 34.50 kg/m2 Pleasant white female in no acute distress. Abdomen: Soft, nontender, without organomegaly Pelvic exam: External genitalia-normal BUS-normal Vagina-moderate to severe atrophy; no ulceration; no lesions Cervix-surgically absent Uterus surgically absent Bimanual exam-no palpable masses or tenderness Rectovaginal-perianal erythema present; no lesions  ASSESSMENT: 1. History of endometrioid adenocarcinoma, NED 2. Need interval Pap smear  PLAN: 1. Pap smear 2. Return in 6 months for interval follow-up  Brayton Mars, MD  Note: This dictation was prepared with Dragon dictation along with smaller phrase technology. Any transcriptional errors that result from this process are unintentional.

## 2015-12-25 LAB — PAP IG W/ RFLX HPV ASCU: PAP SMEAR COMMENT: 0

## 2016-02-05 ENCOUNTER — Ambulatory Visit (INDEPENDENT_AMBULATORY_CARE_PROVIDER_SITE_OTHER): Payer: Managed Care, Other (non HMO) | Admitting: Family Medicine

## 2016-02-05 VITALS — BP 128/64 | HR 64 | Temp 97.8°F | Resp 16 | Wt 181.0 lb

## 2016-02-05 DIAGNOSIS — R1084 Generalized abdominal pain: Secondary | ICD-10-CM | POA: Diagnosis not present

## 2016-02-05 MED ORDER — TRAMADOL HCL 50 MG PO TABS: 50.0000 mg | ORAL_TABLET | Freq: Four times a day (QID) | ORAL | Status: DC | PRN

## 2016-02-05 MED ORDER — OMEPRAZOLE 20 MG PO CPDR: 20.0000 mg | DELAYED_RELEASE_CAPSULE | Freq: Every day | ORAL | Status: DC

## 2016-02-05 NOTE — Progress Notes (Signed)
Patient ID: Angela Thomas, female   DOB: [birth year], 55 y.o.   MRN: [id number]   Angela Thomas  MRN: [id number] DOB: 1961/06/21  Subjective:  HPI   1. Generalized abdominal pain The patient is a 55 year old female who presents for evaluation of adominal pain.  She states that it started on 01/17/16 when she thought she had a GI bug.  After only a day or two of GI symptoms she improved.  She then started about 5 days ago having upper abdominal pain that went across the whole abdomen.  The patient describes it as some of the worst pain she has had.  She associates it with eating but not with eating anything specific.  Shse has not had any nausea, vomiting or fever with the pain.  She did try taking some TUMS but that only helped soe of the burning she had and not any of the pain.  Patient Active Problem List   Diagnosis Date Noted  . DDD (degenerative disc disease), lumbar 04/12/2015  . Vitamin D deficiency 04/12/2015  . Obesity 04/12/2015  . Acute anxiety 04/12/2015  . Allergic rhinitis 04/12/2015  . Fibrocystic breast disease 04/12/2015  . HLD (hyperlipidemia) 02/18/2013  . Arthritis 02/18/2013  . Cancer of endometrium ([place]) 08/02/2012  . Malignant neoplasm of endometrium ([place]) 08/02/2012    Past Medical History  Diagnosis Date  . Cancer [facility])     Endometrial  . DDD (degenerative disc disease), lumbar     Social History   Social History  . Marital Status: Divorced    Spouse Name: N/A  . Number of Children: N/A  . Years of Education: N/A   Occupational History  . Not on file.   Social History Main Topics  . Smoking status: Never Smoker   . Smokeless tobacco: Not on file  . Alcohol Use: 0.6 oz/week    1 Glasses of wine per week     Comment: 1-2 week  . Drug Use: No  . Sexual Activity: Not Currently   Other Topics Concern  . Not on file   Social History Narrative    Outpatient Prescriptions Prior to Visit  Medication Sig Dispense Refill  . ALPRAZolam (XANAX)  0.25 MG tablet Take 1 tablet (0.25 mg total) by mouth every 8 (eight) hours as needed for anxiety. 30 tablet 5  . Cholecalciferol (VITAMIN D3) 1000 UNITS CAPS Take 1 capsule by mouth daily.    . Cyanocobalamin (VITAMIN B12 PO) Take 1 tablet by mouth daily.    . cycloSPORINE (RESTASIS) 0.05 % ophthalmic emulsion Place 1 drop into both eyes every 12 (twelve) hours.    . Diclofenac-Misoprostol (ARTHROTEC) 75-0.2 MG TBEC Take 1 tablet by mouth 2 (two) times daily. 60 tablet 12  . diphenhydrAMINE (BENADRYL) 25 MG tablet Take 25 mg by mouth every 6 (six) hours as needed.    Marland Kitchen EPINEPHrine (EPIPEN 2-PAK) 0.3 mg/0.3 mL IJ SOAJ injection Inject 0.3 mLs (0.3 mg total) into the muscle as needed (for severe allergic reaction). 1 Device 12  . fexofenadine (ALLEGRA) 180 MG tablet Take 1 tablet (180 mg total) by mouth daily. 30 tablet 12  . Flaxseed, Linseed, (FLAX SEED OIL PO) Take 1 tablet by mouth daily.    . folic acid (FOLVITE) 1 MG tablet Take 1 mg by mouth daily.    . Melatonin 10 MG CAPS Take by mouth.    . Omega-3 Fatty Acids (FISH OIL PO) Take 1 tablet by mouth daily.    Marland Kitchen  TURMERIC PO Take 1 capsule by mouth 2 (two) times daily.    . valACYclovir (VALTREX) 500 MG tablet Take 1 tablet (500 mg total) by mouth 2 (two) times daily as needed. 60 tablet 12   No facility-administered medications prior to visit.    Allergies  Allergen Reactions  . Beef-Derived Products Anaphylaxis  . Other Anaphylaxis    Reaction to red meat and pork products  . Pork-Derived Products Anaphylaxis    Review of Systems  Constitutional: Positive for malaise/fatigue. Negative for fever and weight loss.  Respiratory: Negative for cough, shortness of breath and wheezing.   Cardiovascular: Negative for chest pain, palpitations and orthopnea.  Gastrointestinal: Positive for heartburn, abdominal pain and constipation (chronic and unchanged). Negative for nausea, vomiting, diarrhea, blood in stool and melena.  Neurological:  Negative for weakness and headaches.   Objective:  BP 128/64 mmHg  Pulse 64  Temp(Src) 97.8 F (36.6 C) (Oral)  Resp 16  Wt 181 lb (82.101 kg)  Physical Exam  Constitutional: She is oriented to person, place, and time and well-developed, well-nourished, and in no distress.  HENT:  Head: Normocephalic and atraumatic.  Eyes: Pupils are equal, round, and reactive to light.  Neck: Normal range of motion. Neck supple.  Cardiovascular: Normal rate, regular rhythm and normal heart sounds.   Pulmonary/Chest: Effort normal and breath sounds normal.  Abdominal: Soft. Bowel sounds are normal. She exhibits no distension. There is tenderness.  Positive Murphys sign.  Tenderness mid epigastric pain  Neurological: She is alert and oriented to person, place, and time. Gait normal.  Skin: Skin is warm and dry.  Psychiatric: Mood, memory, affect and judgment normal.    Assessment and Plan :  1. Generalized abdominal pain This fits mostly with PUD/gastritis. Possible gallbladder disease. Discussed this with patient. Turn to clinic 2-3 weeks. May need GI referral. - CBC With Differential/Platelet - Comprehensive metabolic panel - Lipase - H. pylori antibody, IgG - US Abdomen Complete; Future - traMADol (ULTRAM) 50 MG tablet; Take 1 tablet (50 mg total) by mouth every 6 (six) hours as needed.  Dispense: 30 tablet; Refill: 5 - omeprazole (PRILOSEC) 20 MG capsule; Take 1 capsule (20 mg total) by mouth daily.  Dispense: 60 capsule; Refill: 3  I have done the exam and reviewed the above chart and it is accurate to the best of my knowledge.  Miguel Aschoff MD [facility] 02/05/2016 4:30 PM

## 2016-02-06 ENCOUNTER — Telehealth: Payer: Self-pay

## 2016-02-06 LAB — COMPREHENSIVE METABOLIC PANEL
ALBUMIN: 4.2 g/dL (ref 3.5–5.5)
ALK PHOS: 84 IU/L (ref 39–117)
ALT: 22 IU/L (ref 0–32)
AST: 23 IU/L (ref 0–40)
Albumin/Globulin Ratio: 1.9 (ref 1.2–2.2)
BUN / CREAT RATIO: 21 (ref 9–23)
BUN: 13 mg/dL (ref 6–24)
CHLORIDE: 101 mmol/L (ref 96–106)
CO2: 25 mmol/L (ref 18–29)
Calcium: 9.5 mg/dL (ref 8.7–10.2)
Creatinine, Ser: 0.61 mg/dL (ref 0.57–1.00)
GFR calc Af Amer: 116 mL/min/{1.73_m2} (ref >59)
GFR calc non Af Amer: 101 mL/min/{1.73_m2} (ref >59)
GLOBULIN, TOTAL: 2.2 g/dL (ref 1.5–4.5)
Glucose: 91 mg/dL (ref 65–99)
Potassium: 4.2 mmol/L (ref 3.5–5.2)
SODIUM: 141 mmol/L (ref 134–144)
Total Protein: 6.4 g/dL (ref 6.0–8.5)

## 2016-02-06 LAB — CBC WITH DIFFERENTIAL/PLATELET
BASOS: 0 %
Basophils Absolute: 0 10*3/uL (ref 0.0–0.2)
EOS (ABSOLUTE): 0.5 10*3/uL — AB (ref 0.0–0.4)
Eos: 7 %
HEMATOCRIT: 37 % (ref 34.0–46.6)
Hemoglobin: 12.1 g/dL (ref 11.1–15.9)
IMMATURE GRANS (ABS): 0 10*3/uL (ref 0.0–0.1)
Immature Granulocytes: 0 %
LYMPHS: 31 %
Lymphocytes Absolute: 2.1 10*3/uL (ref 0.7–3.1)
MCH: 29.4 pg (ref 26.6–33.0)
MCHC: 32.7 g/dL (ref 31.5–35.7)
MCV: 90 fL (ref 79–97)
MONOS ABS: 0.4 10*3/uL (ref 0.1–0.9)
Monocytes: 6 %
NEUTROS ABS: 3.6 10*3/uL (ref 1.4–7.0)
Neutrophils: 56 %
PLATELETS: 351 10*3/uL (ref 150–379)
RBC: 4.11 x10E6/uL (ref 3.77–5.28)
RDW: 14.9 % (ref 12.3–15.4)
WBC: 6.6 10*3/uL (ref 3.4–10.8)

## 2016-02-06 LAB — LIPASE: LIPASE: 37 U/L (ref 0–59)

## 2016-02-06 LAB — H. PYLORI ANTIBODY, IGG: H Pylori IgG: <0.9 U/mL (ref 0.0–0.8)

## 2016-02-06 NOTE — Telephone Encounter (Signed)
-----   Message from Jerrol Banana., MD sent at 02/06/2016  1:42 PM EDT ----- Labs okay.

## 2016-02-06 NOTE — Telephone Encounter (Signed)
LMTCB

## 2016-02-06 NOTE — Telephone Encounter (Signed)
Patient advised as directed below. 

## 2016-02-12 ENCOUNTER — Ambulatory Visit
Admission: RE | Admit: 2016-02-12 | Discharge: 2016-02-12 | Disposition: A | Discharge status: Home / Self Care | Payer: Managed Care, Other (non HMO) | Source: Ambulatory Visit | Attending: Family Medicine | Admitting: Family Medicine

## 2016-02-12 DIAGNOSIS — R1084 Generalized abdominal pain: Secondary | ICD-10-CM | POA: Insufficient documentation

## 2016-02-13 ENCOUNTER — Telehealth: Payer: Self-pay | Admitting: Family Medicine

## 2016-02-13 NOTE — Telephone Encounter (Signed)
[place] results show no gallstones or inflammation of gallbladder. [place] was fairly unremarkable with exception of mild fatty liver.

## 2016-02-13 NOTE — Telephone Encounter (Signed)
Pt had an ultrasound yesterday and wants to know if we have gotten the results back yet.  Her call back is [phone]  Thank sTeri

## 2016-02-13 NOTE — Telephone Encounter (Signed)
Dr. Rosanna Randy patient. Results are in chart. Please advise.

## 2016-02-14 NOTE — Telephone Encounter (Signed)
LMTCB ED 

## 2016-02-14 NOTE — Telephone Encounter (Signed)
Advised  ED 

## 2016-02-20 ENCOUNTER — Ambulatory Visit: Payer: Managed Care, Other (non HMO) | Admitting: Family Medicine

## 2016-02-27 ENCOUNTER — Ambulatory Visit (INDEPENDENT_AMBULATORY_CARE_PROVIDER_SITE_OTHER): Payer: Managed Care, Other (non HMO) | Admitting: Family Medicine

## 2016-02-27 ENCOUNTER — Encounter: Payer: Self-pay | Admitting: Family Medicine

## 2016-02-27 ENCOUNTER — Ambulatory Visit: Payer: Managed Care, Other (non HMO) | Admitting: Obstetrics and Gynecology

## 2016-02-27 VITALS — BP 108/60 | HR 70 | Temp 97.9°F | Resp 16 | Wt 180.0 lb

## 2016-02-27 DIAGNOSIS — R1084 Generalized abdominal pain: Secondary | ICD-10-CM | POA: Diagnosis not present

## 2016-02-27 DIAGNOSIS — M5136 Other intervertebral disc degeneration, lumbar region: Secondary | ICD-10-CM | POA: Diagnosis not present

## 2016-02-27 NOTE — Progress Notes (Signed)
Patient ID: Angela Thomas, female   DOB: 10-05-1961, 55 y.o.   MRN: [id number]    Subjective:  HPI Pt is here for a 3 week follow up of abdominal pain. Noted on last OV. The pain fit most with PUD/gastritis and consider GI referral. U/S was normal, labs normal. Pt reports that she never started taking omeprazole or tramadol and the pain has resolved. She has however stopped taking the Arthrotec since April 16 th. She can tell she has not been taking it.   Prior to Admission medications   Medication Sig Start Date End Date Taking? Authorizing Provider  ALPRAZolam (XANAX) 0.25 MG tablet Take 1 tablet (0.25 mg total) by mouth every 8 (eight) hours as needed for anxiety. 04/16/15  Yes Richard Maceo Pro., MD  Cholecalciferol (VITAMIN D3) 1000 UNITS CAPS Take 1 capsule by mouth daily.   Yes Historical Provider, MD  Cyanocobalamin (VITAMIN B12 PO) Take 1 tablet by mouth daily.   Yes Historical Provider, MD  cycloSPORINE (RESTASIS) 0.05 % ophthalmic emulsion Place 1 drop into both eyes every 12 (twelve) hours.   Yes Historical Provider, MD  diphenhydrAMINE (BENADRYL) 25 MG tablet Take 25 mg by mouth every 6 (six) hours as needed.   Yes Historical Provider, MD  EPINEPHrine (EPIPEN 2-PAK) 0.3 mg/0.3 mL IJ SOAJ injection Inject 0.3 mLs (0.3 mg total) into the muscle as needed (for severe allergic reaction). 04/16/15  Yes Richard Maceo Pro., MD  fexofenadine (ALLEGRA) 180 MG tablet Take 1 tablet (180 mg total) by mouth daily. 08/23/15  Yes Richard Maceo Pro., MD  Flaxseed, Linseed, (FLAX SEED OIL PO) Take 1 tablet by mouth daily.   Yes Historical Provider, MD  folic acid (FOLVITE) 1 MG tablet Take 1 mg by mouth daily.   Yes Historical Provider, MD  Melatonin 10 MG CAPS Take by mouth.   Yes Historical Provider, MD  Omega-3 Fatty Acids (FISH OIL PO) Take 1 tablet by mouth daily.   Yes Historical Provider, MD  TURMERIC PO Take 1 capsule by mouth 2 (two) times daily.   Yes Historical Provider, MD    valACYclovir (VALTREX) 500 MG tablet Take 1 tablet (500 mg total) by mouth 2 (two) times daily as needed. 04/16/15  Yes Richard Maceo Pro., MD  Diclofenac-Misoprostol (ARTHROTEC) 75-0.2 MG TBEC Take 1 tablet by mouth 2 (two) times daily. Patient not taking: Reported on 02/27/2016 08/23/15   Jerrol Banana., MD  omeprazole (PRILOSEC) 20 MG capsule Take 1 capsule (20 mg total) by mouth daily. Patient not taking: Reported on 02/27/2016 02/05/16   Jerrol Banana., MD  traMADol (ULTRAM) 50 MG tablet Take 1 tablet (50 mg total) by mouth every 6 (six) hours as needed. Patient not taking: Reported on 02/27/2016 02/05/16   Jerrol Banana., MD    Patient Active Problem List   Diagnosis Date Noted  . DDD (degenerative disc disease), lumbar 04/12/2015  . Vitamin D deficiency 04/12/2015  . Obesity 04/12/2015  . Acute anxiety 04/12/2015  . Allergic rhinitis 04/12/2015  . Fibrocystic breast disease 04/12/2015  . HLD (hyperlipidemia) 02/18/2013  . Arthritis 02/18/2013  . Cancer of endometrium ([place]) 08/02/2012  . Malignant neoplasm of endometrium ([place]) 08/02/2012    Past Medical History  Diagnosis Date  . Cancer [facility])     Endometrial  . DDD (degenerative disc disease), lumbar     Social History   Social History  . Marital Status: Divorced    Spouse Name: N/A  .  Number of Children: N/A  . Years of Education: N/A   Occupational History  . Not on file.   Social History Main Topics  . Smoking status: Never Smoker   . Smokeless tobacco: Not on file  . Alcohol Use: 0.6 oz/week    1 Glasses of wine per week     Comment: 1-2 week  . Drug Use: No  . Sexual Activity: Not Currently   Other Topics Concern  . Not on file   Social History Narrative    Allergies  Allergen Reactions  . Beef-Derived Products Anaphylaxis  . Other Anaphylaxis    Reaction to red meat and pork products  . Pork-Derived Products Anaphylaxis    Review of Systems  Constitutional: Negative.    HENT: Negative.   Eyes: Negative.   Respiratory: Negative.   Cardiovascular: Negative.   Gastrointestinal: Negative.   Genitourinary: Negative.   Musculoskeletal: Positive for joint pain.  Skin: Negative.   Neurological: Negative.   Endo/Heme/Allergies: Negative.   Psychiatric/Behavioral: Negative.     Immunization History  Administered Date(s) Administered  . Influenza-Unspecified 09/03/2015  . Tdap 12/09/2012   Objective:  BP 108/60 mmHg  Pulse 70  Temp(Src) 97.9 F (36.6 C) (Oral)  Resp 16  Wt 180 lb (81.647 kg)  Physical Exam  Constitutional: She is oriented to person, place, and time and well-developed, well-nourished, and in no distress.  Eyes: Conjunctivae and EOM are normal. Pupils are equal, round, and reactive to light.  Neck: Normal range of motion. Neck supple.  Cardiovascular: Normal rate, regular rhythm, normal heart sounds and intact distal pulses.   Pulmonary/Chest: Effort normal and breath sounds normal.  Abdominal: Bowel sounds are normal.  Musculoskeletal: Normal range of motion.  Neurological: She is alert and oriented to person, place, and time. She has normal reflexes. Gait normal. GCS score is 15.  Skin: Skin is warm and dry.  Psychiatric: Mood, memory, affect and judgment normal.    Lab Results  Component Value Date   WBC 6.6 02/05/2016   HGB 12.7 04/11/2015   HCT 37.0 02/05/2016   PLT 351 02/05/2016   GLUCOSE 91 02/05/2016   CHOL 236* 08/10/2015   TRIG 100 08/10/2015   HDL 56 08/10/2015   LDLCALC 160* 08/10/2015   TSH 2.080 08/10/2015    CMP     Component Value Date/Time   NA 141 02/05/2016 1705   NA 137 04/11/2015 0846   NA 144 07/22/2012 1521   K 4.2 02/05/2016 1705   K 3.6 07/22/2012 1521   CL 101 02/05/2016 1705   CL 106 07/22/2012 1521   CO2 25 02/05/2016 1705   CO2 30 07/22/2012 1521   GLUCOSE 91 02/05/2016 1705   GLUCOSE 111* 04/11/2015 0846   GLUCOSE 70 07/22/2012 1521   BUN 13 02/05/2016 1705   BUN 14 04/11/2015  0846   BUN 10 07/22/2012 1521   CREATININE 0.61 02/05/2016 1705   CREATININE 0.6 08/01/2014   CREATININE 0.64 07/22/2012 1521   CALCIUM 9.5 02/05/2016 1705   CALCIUM 9.6 07/22/2012 1521   PROT 6.4 02/05/2016 1705   ALBUMIN 4.2 02/05/2016 1705   AST 23 02/05/2016 1705   ALT 22 02/05/2016 1705   ALKPHOS 84 02/05/2016 1705   BILITOT <0.2 02/05/2016 1705   GFRNONAA 101 02/05/2016 1705   GFRNONAA >60 07/22/2012 1521   GFRAA 116 02/05/2016 1705   GFRAA >60 07/22/2012 1521    Assessment and Plan :  1. Generalized abdominal pain Resolved. Not taking PPI.  2.  DDD (degenerative disc disease), lumbar Continue off of Arthrotec. Pt using essential oils for pain, and seems to be holding stable. Follow up in 3 months.  Try to avoid restarting any nonsteroidal. 3. Endometrial cancer Patient doing well status post hysterectomy. Patient was seen and examined by Dr. Miguel Aschoff, and noted scribed by Webb Laws, CMA I have done the exam and reviewed the above chart and it is accurate to the best of my knowledge.  Miguel Aschoff MD [facility] 02/27/2016 4:12 PM

## 2016-06-18 ENCOUNTER — Encounter: Payer: Self-pay | Admitting: Obstetrics and Gynecology

## 2016-06-18 ENCOUNTER — Ambulatory Visit (INDEPENDENT_AMBULATORY_CARE_PROVIDER_SITE_OTHER): Payer: Managed Care, Other (non HMO) | Admitting: Obstetrics and Gynecology

## 2016-06-18 VITALS — BP 123/76 | HR 70 | Ht 61.0 in | Wt 175.2 lb

## 2016-06-18 DIAGNOSIS — R5383 Other fatigue: Secondary | ICD-10-CM | POA: Diagnosis not present

## 2016-06-18 DIAGNOSIS — R399 Unspecified symptoms and signs involving the genitourinary system: Secondary | ICD-10-CM | POA: Insufficient documentation

## 2016-06-18 DIAGNOSIS — N952 Postmenopausal atrophic vaginitis: Secondary | ICD-10-CM

## 2016-06-18 DIAGNOSIS — C541 Malignant neoplasm of endometrium: Secondary | ICD-10-CM | POA: Diagnosis not present

## 2016-06-18 LAB — POCT URINALYSIS DIPSTICK
BILIRUBIN UA: NEGATIVE
Blood, UA: NEGATIVE
Glucose, UA: NEGATIVE
KETONES UA: NEGATIVE
LEUKOCYTES UA: NEGATIVE
Nitrite, UA: NEGATIVE
PH UA: 7
Protein, UA: NEGATIVE
SPEC GRAV UA: 1.015
Urobilinogen, UA: NEGATIVE

## 2016-06-18 NOTE — Progress Notes (Signed)
GYN ENCOUNTER NOTE  Subjective:       Angela Thomas is a 55 y.o. G2P2 female is here for gynecologic evaluation of the following issues:  1. Endometrial cancer  Patient presents for 6 month follow-up endometrial cancer. Patient reports normal bowel and bladder function. She is experiencing some fatigue and some vague lower abdominal pelvic discomfort which is consistent with history of previous UTI.  Recent workup for abdominal pain was negative.  Endometrial cancer Management summary:  07/26/2012, hysteroscopy/D&C .  Pathology: Endometrioid adenocarcinoma with mucinous differentiation, grade 1  08/2012.  Robotically-assisted total laparoscopic hysterectomy, BSO, sentinel lymph node dissection.  05/04/2013.  No evidence of disease.  02/22/2014.  No evidence of disease  Interval workup by Dr. Rosanna Randy, primary care: 10/01/2015 CT abdomen and pelvis with contrast-no acute findings  02/12/2016 abdominal ultrasound-negative gallstones; probable fatty infiltration of labor; no acute findings  CBC Latest Ref Rng & Units 02/05/2016 08/10/2015 04/11/2015  WBC 3.4 - 10.8 x10E3/uL 6.6 5.9 6.0  Hemoglobin 12.0 - 16.0 g/dL - - 12.7  Hematocrit 34.0 - 46.6 % 37.0 35.6 39.0  Platelets 150 - 379 x10E3/uL 351 409(H) 331   CMP     Component Value Date/Time   NA 141 02/05/2016 1705   NA 144 07/22/2012 1521   K 4.2 02/05/2016 1705   K 3.6 07/22/2012 1521   CL 101 02/05/2016 1705   CL 106 07/22/2012 1521   CO2 25 02/05/2016 1705   CO2 30 07/22/2012 1521   GLUCOSE 91 02/05/2016 1705   GLUCOSE 111 (H) 04/11/2015 0846   GLUCOSE 70 07/22/2012 1521   BUN 13 02/05/2016 1705   BUN 10 07/22/2012 1521   CREATININE 0.61 02/05/2016 1705   CREATININE 0.64 07/22/2012 1521   CALCIUM 9.5 02/05/2016 1705   CALCIUM 9.6 07/22/2012 1521   PROT 6.4 02/05/2016 1705   ALBUMIN 4.2 02/05/2016 1705   AST 23 02/05/2016 1705   ALT 22 02/05/2016 1705   ALKPHOS 84 02/05/2016 1705   BILITOT <0.2 02/05/2016 1705    GFRNONAA 101 02/05/2016 1705   GFRNONAA >60 07/22/2012 1521   GFRAA 116 02/05/2016 1705   GFRAA >60 07/22/2012 1521   Gynecologic History No LMP recorded. Patient has had a hysterectomy. Contraception: status post hysterectomy Last Pap: Negative 12/19/2015 Last mammogram: BI-RADS 1 07/31/2015  Obstetric History OB History  Gravida Para Term Preterm AB Living  2 2          SAB TAB Ectopic Multiple Live Births               # Outcome Date GA Lbr Len/2nd Weight Sex Delivery Anes PTL Lv  2 Para           1 Para               Past Medical History:  Diagnosis Date  . Cancer [facility])    Endometrial  . DDD (degenerative disc disease), lumbar     Past Surgical History:  Procedure Laterality Date  . ABDOMINAL HYSTERECTOMY     total  . BREAST CYST ASPIRATION Bilateral   . BUNIONECTOMY    . BUNIONECTOMY     With hammer toe repair  . CYST REMOVAL NECK    . HAMMER TOE SURGERY    . TONSILLECTOMY AND ADENOIDECTOMY      Current Outpatient Prescriptions on File Prior to Visit  Medication Sig Dispense Refill  . ALPRAZolam (XANAX) 0.25 MG tablet Take 1 tablet (0.25 mg total) by mouth every 8 (eight)  hours as needed for anxiety. 30 tablet 5  . Cholecalciferol (VITAMIN D3) 1000 UNITS CAPS Take 1 capsule by mouth daily.    . Cyanocobalamin (VITAMIN B12 PO) Take 1 tablet by mouth daily.    . cycloSPORINE (RESTASIS) 0.05 % ophthalmic emulsion Place 1 drop into both eyes every 12 (twelve) hours.    . Diclofenac-Misoprostol (ARTHROTEC) 75-0.2 MG TBEC Take 1 tablet by mouth 2 (two) times daily. (Patient not taking: Reported on 02/27/2016) 60 tablet 12  . diphenhydrAMINE (BENADRYL) 25 MG tablet Take 25 mg by mouth every 6 (six) hours as needed.    Marland Kitchen EPINEPHrine (EPIPEN 2-PAK) 0.3 mg/0.3 mL IJ SOAJ injection Inject 0.3 mLs (0.3 mg total) into the muscle as needed (for severe allergic reaction). 1 Device 12  . fexofenadine (ALLEGRA) 180 MG tablet Take 1 tablet (180 mg total) by mouth daily. 30 tablet  12  . Flaxseed, Linseed, (FLAX SEED OIL PO) Take 1 tablet by mouth daily.    . folic acid (FOLVITE) 1 MG tablet Take 1 mg by mouth daily.    . Melatonin 10 MG CAPS Take by mouth.    . Omega-3 Fatty Acids (FISH OIL PO) Take 1 tablet by mouth daily.    Marland Kitchen omeprazole (PRILOSEC) 20 MG capsule Take 1 capsule (20 mg total) by mouth daily. (Patient not taking: Reported on 02/27/2016) 60 capsule 3  . traMADol (ULTRAM) 50 MG tablet Take 1 tablet (50 mg total) by mouth every 6 (six) hours as needed. (Patient not taking: Reported on 02/27/2016) 30 tablet 5  . TURMERIC PO Take 1 capsule by mouth 2 (two) times daily.    . valACYclovir (VALTREX) 500 MG tablet Take 1 tablet (500 mg total) by mouth 2 (two) times daily as needed. 60 tablet 12   No current facility-administered medications on file prior to visit.     Allergies  Allergen Reactions  . Beef-Derived Products Anaphylaxis  . Other Anaphylaxis    Reaction to red meat and pork products  . Pork-Derived Products Anaphylaxis    Social History   Social History  . Marital status: Divorced    Spouse name: N/A  . Number of children: N/A  . Years of education: N/A   Occupational History  . Not on file.   Social History Main Topics  . Smoking status: Never Smoker  . Smokeless tobacco: Not on file  . Alcohol use 0.6 oz/week    1 Glasses of wine per week     Comment: 1-2 week  . Drug use: No  . Sexual activity: Not Currently   Other Topics Concern  . Not on file   Social History Narrative  . No narrative on file    Family History  Problem Relation Age of Onset  . COPD Mother   . COPD Father   . Diabetes Father   . Pulmonary fibrosis Father   . Bladder Cancer Father   . Breast cancer Maternal Aunt   . Dementia Maternal Aunt   . Transient ischemic attack Maternal Uncle   . Heart disease Maternal Uncle   . Cancer Maternal Uncle     laryngeal  . Heart disease Maternal Grandfather   . Diabetes Paternal Grandfather   . Prostate  cancer Paternal Grandfather   . Other Paternal Grandmother     cerebral hemorrhage    The following portions of the patient's history were reviewed and updated as appropriate: allergies, current medications, past family history, past medical history, past social history, past surgical  history and problem list.  Review of Systems Review of Systems - General ROS: negative for - chills, fever, malaise or night sweats. POSITIVE-hot flashes, fatigue Hematological and Lymphatic ROS: negative for - bleeding problems or swollen lymph nodes Gastrointestinal ROS: negative for - abdominal pain, blood in stools, change in bowel habits and nausea/vomiting Musculoskeletal ROS: negative for - joint pain, muscle pain or muscular weakness Genito-Urinary ROS: negative for - change in menstrual cycle, dysmenorrhea, dyspareunia, dysuria, genital discharge, genital ulcers, hematuria, incontinence, irregular/heavy menses, nocturia or pelvic painjj  Objective:   There were no vitals taken for this visit. CONSTITUTIONAL: Well-developed, well-nourished female in no acute distress.  HENT:  Normocephalic, atraumatic.  NECK: Normal range of motion, supple, no masses.  Normal thyroid.  SKIN: Skin is warm and dry. No rash noted. Not diaphoretic. No erythema. No pallor. [place]: Alert and oriented to person, place, and time. PSYCHIATRIC: Normal mood and affect. Normal behavior. Normal judgment and thought content. CARDIOVASCULAR:Regular rate and rhythm without murmur RESPIRATORY: Clear BREASTS: Not Examined ABDOMEN: Soft, non distended; Non tender.  No Organomegaly. PELVIC:  External Genitalia: Normal  BUS: Normal  Vagina: Atrophy; first-degree cystocele  Cervix: Surgically absent  Uterus: Surgically absent  Adnexa: Normal  RV: Normal external exam; sphincter tone normal; no rectal masses  Bladder: Nontender MUSCULOSKELETAL: Normal range of motion. No tenderness.  No cyanosis, clubbing, or  edema.     Assessment:  1. Endometrioid endometrial cancer, stage I, status post robotically-assisted TL H BSO with lymph node sampling 2. No evidence of disease 3. Fatigue and UTI symptoms 4. Vaginal atrophy  Plan:   1. Urinalysis with culture 2. Return in 6 months for interval follow-up 3. Follow-up with Dr. Rosanna Randy in October for annual physical including blood work, mammogram, and colon cancer screening  A total of 15 minutes were spent face-to-face with the patient during this encounter and over half of that time dealt with counseling and coordination of care.  Brayton Mars, MD  Note: This dictation was prepared with Dragon dictation along with smaller phrase technology. Any transcriptional errors that result from this process are unintentional. \

## 2016-06-18 NOTE — Patient Instructions (Signed)
1. Urinalysis and culture is obtained 2. Return in 6 months for interval follow-up 3. Follow-up with Dr. Rosanna Randy for routine annual screening physical including mammogram, colon cancer screening, blood work

## 2016-06-18 NOTE — Addendum Note (Signed)
Addended by: Earl Lagos on: 06/18/2016 08:40 AM   Modules accepted: Orders

## 2016-06-20 LAB — URINE CULTURE: Organism ID, Bacteria: NO GROWTH

## 2016-08-06 ENCOUNTER — Ambulatory Visit (INDEPENDENT_AMBULATORY_CARE_PROVIDER_SITE_OTHER): Payer: Managed Care, Other (non HMO) | Admitting: Family Medicine

## 2016-08-06 VITALS — BP 112/76 | HR 76 | Temp 98.1°F | Resp 16 | Ht 62.5 in | Wt 179.0 lb

## 2016-08-06 DIAGNOSIS — J309 Allergic rhinitis, unspecified: Secondary | ICD-10-CM

## 2016-08-06 DIAGNOSIS — F419 Anxiety disorder, unspecified: Secondary | ICD-10-CM | POA: Diagnosis not present

## 2016-08-06 DIAGNOSIS — Z0000 Encounter for general adult medical examination without abnormal findings: Secondary | ICD-10-CM | POA: Diagnosis not present

## 2016-08-06 DIAGNOSIS — M1990 Unspecified osteoarthritis, unspecified site: Secondary | ICD-10-CM | POA: Diagnosis not present

## 2016-08-06 MED ORDER — FEXOFENADINE HCL 180 MG PO TABS: 180.0000 mg | ORAL_TABLET | Freq: Every day | ORAL | 12 refills | Status: DC

## 2016-08-06 MED ORDER — ALPRAZOLAM 0.25 MG PO TABS: 0.2500 mg | ORAL_TABLET | Freq: Three times a day (TID) | ORAL | 5 refills | Status: DC | PRN

## 2016-08-06 MED ORDER — DICLOFENAC-MISOPROSTOL 75-0.2 MG PO TBEC: 1.0000 | DELAYED_RELEASE_TABLET | Freq: Two times a day (BID) | ORAL | 12 refills | Status: DC

## 2016-08-06 NOTE — Progress Notes (Signed)
Patient: Angela Thomas, Female    DOB: [birth year], 55 y.o.   MRN: [id number] Visit Date: 08/06/2016  Today's Provider: [name], MD   Chief Complaint  Patient presents with  . Annual Exam   Subjective:  Angela Thomas is a 55 y.o. female who presents today for health maintenance and complete physical. She feels well. She reports exercising 5 times weekly. She reports she is sleeping well  02/01/2010 Colonoscopy-nl, repeat 10 years Mammogram and Pap per Gyn.  Flu shot per work   Review of Systems  Constitutional: Negative.   HENT: Negative.   Eyes: Negative.   Respiratory: Negative.   Cardiovascular: Negative.   Gastrointestinal: Negative.   Endocrine: Negative.   Genitourinary: Negative.   Musculoskeletal: Negative.   Skin: Negative.   Allergic/Immunologic: Negative.   Neurological: Negative.   Hematological: Negative.   Psychiatric/Behavioral: Negative.     Social History   Social History  . Marital status: Divorced    Spouse name: N/A  . Number of children: N/A  . Years of education: N/A   Occupational History  . Not on file.   Social History Main Topics  . Smoking status: Never Smoker  . Smokeless tobacco: Not on file  . Alcohol use 0.6 oz/week    1 Glasses of wine per week     Comment: 1-2 week  . Drug use: No  . Sexual activity: Not Currently   Other Topics Concern  . Not on file   Social History Narrative  . No narrative on file    Patient Active Problem List   Diagnosis Date Noted  . Other fatigue 06/18/2016  . Vaginal atrophy 06/18/2016  . UTI symptoms 06/18/2016  . DDD (degenerative disc disease), lumbar 04/12/2015  . Vitamin D deficiency 04/12/2015  . Obesity 04/12/2015  . Acute anxiety 04/12/2015  . Allergic rhinitis 04/12/2015  . Fibrocystic breast disease 04/12/2015  . HLD (hyperlipidemia) 02/18/2013  . Arthritis 02/18/2013  . Cancer of endometrium ([place]) 08/02/2012  . Endometrial cancer ([place]) 08/02/2012    Past  Surgical History:  Procedure Laterality Date  . ABDOMINAL HYSTERECTOMY     total  . BREAST CYST ASPIRATION Bilateral   . BUNIONECTOMY    . BUNIONECTOMY     With hammer toe repair  . CYST REMOVAL NECK    . HAMMER TOE SURGERY    . TONSILLECTOMY AND ADENOIDECTOMY      Her family history includes Bladder Cancer in her father; Breast cancer in her maternal aunt; COPD in her father and mother; Cancer in her maternal uncle; Dementia in her maternal aunt; Diabetes in her father and paternal grandfather; Heart disease in her maternal grandfather and maternal uncle; Other in her paternal grandmother; Prostate cancer in her paternal grandfather; Pulmonary fibrosis in her father; Transient ischemic attack in her maternal uncle.    Outpatient Encounter Prescriptions as of 08/06/2016  Medication Sig  . ACTIPHYTE OF LEMONGRASS LIQD by Does not apply route.  . ALPRAZolam (XANAX) 0.25 MG tablet Take 1 tablet (0.25 mg total) by mouth every 8 (eight) hours as needed for anxiety.  . Cholecalciferol (VITAMIN D3) 1000 UNITS CAPS Take 1 capsule by mouth daily.  . Cyanocobalamin (VITAMIN B12 PO) Take 1 tablet by mouth daily.  . cycloSPORINE (RESTASIS) 0.05 % ophthalmic emulsion Place 1 drop into both eyes every 12 (twelve) hours.  . Diclofenac-Misoprostol (ARTHROTEC) 75-0.2 MG TBEC Take 1 tablet by mouth 2 (two) times daily.  . diphenhydrAMINE (BENADRYL) 25 MG tablet Take 25  mg by mouth every 6 (six) hours as needed.  Marland Kitchen EPINEPHrine (EPIPEN 2-PAK) 0.3 mg/0.3 mL IJ SOAJ injection Inject 0.3 mLs (0.3 mg total) into the muscle as needed (for severe allergic reaction).  . fexofenadine (ALLEGRA) 180 MG tablet Take 1 tablet (180 mg total) by mouth daily.  . Flaxseed, Linseed, (FLAX SEED OIL PO) Take 1 tablet by mouth daily.  . folic acid (FOLVITE) 1 MG tablet Take 1 mg by mouth daily.  . Homeopathic Products (FRANKINCENSE UPLIFTING) OIL Inhale into the lungs.  . Omega-3 Fatty Acids (FISH OIL PO) Take 1 tablet by mouth  daily.  Marland Kitchen omeprazole (PRILOSEC) 20 MG capsule Take 1 capsule (20 mg total) by mouth daily.  . valACYclovir (VALTREX) 500 MG tablet Take 1 tablet (500 mg total) by mouth 2 (two) times daily as needed.  . [DISCONTINUED] Melatonin 10 MG CAPS Take by mouth.  . [DISCONTINUED] TURMERIC PO Take 1 capsule by mouth 2 (two) times daily.   No facility-administered encounter medications on file as of 08/06/2016.     Patient Care Team: Jerrol Banana., MD as PCP - General (Family Medicine)     Objective:   Vitals:  Vitals:   08/06/16 1020  BP: 112/76  Pulse: 76  Resp: 16  Temp: 98.1 F (36.7 C)  TempSrc: Oral  Weight: 179 lb (81.2 kg)  Height: 5' 2.5" (1.588 m)    Physical Exam  Constitutional: She is oriented to person, place, and time. She appears well-developed and well-nourished.  HENT:  Head: Normocephalic and atraumatic.  Right Ear: External ear normal.  Left Ear: External ear normal.  Nose: Nose normal.  Mouth/Throat: Oropharynx is clear and moist.  Eyes: Conjunctivae and EOM are normal.  Neck: Normal range of motion. Neck supple.  Cardiovascular: Normal rate, regular rhythm, normal heart sounds and intact distal pulses.   Pulmonary/Chest: Effort normal and breath sounds normal.  Abdominal: Soft. Bowel sounds are normal.  Musculoskeletal: Normal range of motion.  Neurological: She is alert and oriented to person, place, and time.  Skin: Skin is warm and dry.  Psychiatric: She has a normal mood and affect. Her behavior is normal. Judgment and thought content normal.   Current Exercise Habits: Home exercise routine, Type of exercise: walking, Time (Minutes): 60, Frequency (Times/Week): 5, Weekly Exercise (Minutes/Week): 300, Intensity: Moderate    Fall Risk  08/06/2016  Falls in the past year? No   Depression screen PHQ 2/9 08/06/2016  Decreased Interest 0  Down, Depressed, Hopeless 0  PHQ - 2 Score 0     Depression Screen No flowsheet data  found.    Assessment & Plan:     Routine Health Maintenance and Physical Exam  Exercise Activities and Dietary recommendations Goals    None      Immunization History  Administered Date(s) Administered  . Influenza-Unspecified 09/03/2015  . Tdap 12/09/2012    Health Maintenance  Topic Date Due  . Hepatitis C Screening  November 26, 1960  . HIV Screening  11/23/1973  . INFLUENZA VACCINE  06/03/2016  . MAMMOGRAM  07/30/2017  . PAP SMEAR  12/18/2018  . COLONOSCOPY  02/02/2020  . TETANUS/TDAP  12/09/2022     Diet and exercise discussed at length Discussed health benefits of physical activity, and encouraged her to engage in regular exercise appropriate for her age and condition.  Status post hysterectomy for uterine cancer Followed by GYN-Dr. Defrancesco I have done the exam and reviewed the chart and it is accurate to the best of  my knowledge. Miguel Aschoff M.D. [facility]

## 2016-09-10 LAB — LIPID PANEL WITH LDL/HDL RATIO
Cholesterol, Total: 235 mg/dL — ABNORMAL HIGH (ref 100–199)
HDL: 55 mg/dL (ref >39)
LDL CALC: 152 mg/dL — AB (ref 0–99)
LDL/HDL RATIO: 2.8 ratio (ref 0.0–3.2)
Triglycerides: 139 mg/dL (ref 0–149)
VLDL CHOLESTEROL CAL: 28 mg/dL (ref 5–40)

## 2016-09-10 LAB — COMPREHENSIVE METABOLIC PANEL
A/G RATIO: 2.1 (ref 1.2–2.2)
ALBUMIN: 4.6 g/dL (ref 3.5–5.5)
ALK PHOS: 89 IU/L (ref 39–117)
ALT: 20 IU/L (ref 0–32)
AST: 17 IU/L (ref 0–40)
BILIRUBIN TOTAL: 0.2 mg/dL (ref 0.0–1.2)
BUN/Creatinine Ratio: 25 — ABNORMAL HIGH (ref 9–23)
BUN: 15 mg/dL (ref 6–24)
CO2: 25 mmol/L (ref 18–29)
CREATININE: 0.61 mg/dL (ref 0.57–1.00)
Calcium: 9.6 mg/dL (ref 8.7–10.2)
Chloride: 101 mmol/L (ref 96–106)
GFR, EST AFRICAN AMERICAN: 116 mL/min/{1.73_m2} (ref >59)
GFR, EST NON AFRICAN AMERICAN: 101 mL/min/{1.73_m2} (ref >59)
GLOBULIN, TOTAL: 2.2 g/dL (ref 1.5–4.5)
Glucose: 99 mg/dL (ref 65–99)
Potassium: 4.9 mmol/L (ref 3.5–5.2)
Sodium: 143 mmol/L (ref 134–144)
Total Protein: 6.8 g/dL (ref 6.0–8.5)

## 2016-09-10 LAB — CBC WITH DIFFERENTIAL/PLATELET
BASOS: 0 %
Basophils Absolute: 0 10*3/uL (ref 0.0–0.2)
EOS (ABSOLUTE): 0.2 10*3/uL (ref 0.0–0.4)
EOS: 5 %
HEMATOCRIT: 40.2 % (ref 34.0–46.6)
Hemoglobin: 13.3 g/dL (ref 11.1–15.9)
IMMATURE GRANS (ABS): 0 10*3/uL (ref 0.0–0.1)
IMMATURE GRANULOCYTES: 0 %
LYMPHS: 30 %
Lymphocytes Absolute: 1.6 10*3/uL (ref 0.7–3.1)
MCH: 30.8 pg (ref 26.6–33.0)
MCHC: 33.1 g/dL (ref 31.5–35.7)
MCV: 93 fL (ref 79–97)
Monocytes Absolute: 0.3 10*3/uL (ref 0.1–0.9)
Monocytes: 6 %
NEUTROS ABS: 3.1 10*3/uL (ref 1.4–7.0)
NEUTROS PCT: 59 %
Platelets: 373 10*3/uL (ref 150–379)
RBC: 4.32 x10E6/uL (ref 3.77–5.28)
RDW: 13.3 % (ref 12.3–15.4)
WBC: 5.3 10*3/uL (ref 3.4–10.8)

## 2016-09-10 LAB — TSH: TSH: 2.67 u[IU]/mL (ref 0.450–4.500)

## 2016-09-12 ENCOUNTER — Other Ambulatory Visit: Payer: Self-pay | Admitting: Family Medicine

## 2016-09-12 DIAGNOSIS — Z1231 Encounter for screening mammogram for malignant neoplasm of breast: Secondary | ICD-10-CM

## 2016-09-17 ENCOUNTER — Other Ambulatory Visit: Payer: Self-pay | Admitting: Family Medicine

## 2016-09-17 DIAGNOSIS — T7840XD Allergy, unspecified, subsequent encounter: Secondary | ICD-10-CM

## 2016-09-17 MED ORDER — EPINEPHRINE 0.3 MG/0.3ML IJ SOAJ
0.3000 mg | INTRAMUSCULAR | 12 refills | Status: DC | PRN
Start: 2016-09-17 — End: 2017-06-14

## 2016-09-17 NOTE — Telephone Encounter (Signed)
Please review. KW 

## 2016-09-17 NOTE — Telephone Encounter (Signed)
Pt contacted office for refill request on the following medications: EPINEPHrine (EPIPEN 2-PAK) 0.3 mg/0.3 mL IJ SOAJ injection Last written: 04/16/15 with 12 refills Last OV: 08/06/16 Pt stated that her Epipen is expired and she needs a new Rx sent to [facility]. Please advise. Thanks TNP

## 2016-09-17 NOTE — Telephone Encounter (Signed)
RX sent in and pt advised-aa

## 2016-09-17 NOTE — Telephone Encounter (Signed)
Ok to rf for 1 year.

## 2016-10-22 ENCOUNTER — Ambulatory Visit: Payer: Managed Care, Other (non HMO)

## 2016-11-14 ENCOUNTER — Ambulatory Visit
Admission: RE | Admit: 2016-11-14 | Discharge: 2016-11-14 | Disposition: A | Discharge status: Home / Self Care | Payer: Managed Care, Other (non HMO) | Source: Ambulatory Visit | Attending: Family Medicine | Admitting: Family Medicine

## 2016-11-14 DIAGNOSIS — Z1231 Encounter for screening mammogram for malignant neoplasm of breast: Secondary | ICD-10-CM | POA: Diagnosis present

## 2016-12-02 ENCOUNTER — Ambulatory Visit (INDEPENDENT_AMBULATORY_CARE_PROVIDER_SITE_OTHER): Payer: Managed Care, Other (non HMO) | Admitting: Obstetrics and Gynecology

## 2016-12-02 ENCOUNTER — Encounter: Payer: Self-pay | Admitting: Obstetrics and Gynecology

## 2016-12-02 VITALS — BP 156/81 | HR 72 | Ht 62.5 in | Wt 182.5 lb

## 2016-12-02 DIAGNOSIS — C541 Malignant neoplasm of endometrium: Secondary | ICD-10-CM | POA: Diagnosis not present

## 2016-12-02 DIAGNOSIS — N939 Abnormal uterine and vaginal bleeding, unspecified: Secondary | ICD-10-CM

## 2016-12-02 DIAGNOSIS — Z1211 Encounter for screening for malignant neoplasm of colon: Secondary | ICD-10-CM | POA: Diagnosis not present

## 2016-12-02 LAB — POCT URINALYSIS DIPSTICK
Bilirubin, UA: NEGATIVE
Blood, UA: NEGATIVE
Glucose, UA: NEGATIVE
KETONES UA: NEGATIVE
Leukocytes, UA: NEGATIVE
Nitrite, UA: NEGATIVE
PH UA: 7
PROTEIN UA: NEGATIVE
Urobilinogen, UA: NEGATIVE

## 2016-12-02 NOTE — Progress Notes (Signed)
GYN ENCOUNTER NOTE  Subjective:       Angela Thomas is a 56 y.o. G2P2 female is here for gynecologic evaluation of the following issues:  1. Vaginal bleeding-patient had episode of pasty secretions with blood identified following orgasm. No recurrence. No UTI symptoms. No blood in urine. Bowel movements are notable for constipation requiring Colace. No pain with bowel movements. Patient has 6 month interval evaluation with me next month.   Gynecologic History No LMP recorded. Patient has had a hysterectomy.  Obstetric History OB History  Gravida Para Term Preterm AB Living  2 2          SAB TAB Ectopic Multiple Live Births               # Outcome Date GA Lbr Len/2nd Weight Sex Delivery Anes PTL Lv  2 Para           1 Para               Past Medical History:  Diagnosis Date  . Cancer [facility])    Endometrial  . DDD (degenerative disc disease), lumbar     Past Surgical History:  Procedure Laterality Date  . ABDOMINAL HYSTERECTOMY     total  . BREAST CYST ASPIRATION Bilateral   . BUNIONECTOMY    . BUNIONECTOMY     With hammer toe repair  . CYST REMOVAL NECK    . HAMMER TOE SURGERY    . TONSILLECTOMY AND ADENOIDECTOMY      Current Outpatient Prescriptions on File Prior to Visit  Medication Sig Dispense Refill  . ACTIPHYTE OF [place] LIQD by Does not apply route.    . ALPRAZolam (XANAX) 0.25 MG tablet Take 1 tablet (0.25 mg total) by mouth every 8 (eight) hours as needed for anxiety. 30 tablet 5  . Cyanocobalamin (VITAMIN B12 PO) Take 1 tablet by mouth daily.    . cycloSPORINE (RESTASIS) 0.05 % ophthalmic emulsion Place 1 drop into both eyes every 12 (twelve) hours.    . Diclofenac-Misoprostol (ARTHROTEC) 75-0.2 MG TBEC Take 1 tablet by mouth 2 (two) times daily. 60 tablet 12  . diphenhydrAMINE (BENADRYL) 25 MG tablet Take 25 mg by mouth every 6 (six) hours as needed.    Marland Kitchen EPINEPHrine (EPIPEN 2-PAK) 0.3 mg/0.3 mL IJ SOAJ injection Inject 0.3 mLs (0.3 mg total) into the  muscle as needed (for severe allergic reaction). 1 Device 12  . fexofenadine (ALLEGRA) 180 MG tablet Take 1 tablet (180 mg total) by mouth daily. 30 tablet 12  . Flaxseed, Linseed, (FLAX SEED OIL PO) Take 1 tablet by mouth daily.    . folic acid (FOLVITE) 1 MG tablet Take 1 mg by mouth daily.    . Homeopathic Products (FRANKINCENSE UPLIFTING) OIL Inhale into the lungs.    . Omega-3 Fatty Acids (FISH OIL PO) Take 1 tablet by mouth daily.    . valACYclovir (VALTREX) 500 MG tablet Take 1 tablet (500 mg total) by mouth 2 (two) times daily as needed. 60 tablet 12   No current facility-administered medications on file prior to visit.     Allergies  Allergen Reactions  . Beef-Derived Products Anaphylaxis  . Other Anaphylaxis    Reaction to red meat and pork products  . Pork-Derived Products Anaphylaxis    Social History   Social History  . Marital status: Divorced    Spouse name: N/A  . Number of children: N/A  . Years of education: N/A   Occupational History  .  Not on file.   Social History Main Topics  . Smoking status: Never Smoker  . Smokeless tobacco: Never Used  . Alcohol use 0.6 oz/week    1 Glasses of wine per week     Comment: 1-2 week  . Drug use: No  . Sexual activity: Not Currently   Other Topics Concern  . Not on file   Social History Narrative  . No narrative on file    Family History  Problem Relation Age of Onset  . COPD Mother   . COPD Father   . Diabetes Father   . Pulmonary fibrosis Father   . Bladder Cancer Father   . Breast cancer Maternal Aunt   . Dementia Maternal Aunt   . Transient ischemic attack Maternal Uncle   . Heart disease Maternal Uncle   . Cancer Maternal Uncle     laryngeal  . Heart disease Maternal Grandfather   . Diabetes Paternal Grandfather   . Prostate cancer Paternal Grandfather   . Other Paternal Grandmother     cerebral hemorrhage    The following portions of the patient's history were reviewed and updated as  appropriate: allergies, current medications, past family history, past medical history, past social history, past surgical history and problem list.  Review of Systems Review of Systems - Per history of present illness  Objective:   BP (!) 156/81   Pulse 72   Ht 5' 2.5" (1.588 m)   Wt 182 lb 8 oz (82.8 kg)   BMI 32.85 kg/m  CONSTITUTIONAL: Well-developed, well-nourished female in no acute distress.  HENT:  Normocephalic, atraumatic.  NECK: Normal range of motion, supple, no masses.  Normal thyroid.  SKIN: Skin is warm and dry. No rash noted. Not diaphoretic. No erythema. No pallor. Wagner: Alert and oriented to person, place, and time. PSYCHIATRIC: Normal mood and affect. Normal behavior. Normal judgment and thought content. CARDIOVASCULAR:Not Examined RESPIRATORY: Not Examined BREASTS: Not Examined ABDOMEN: Soft, non distended; Non tender.  No Organomegaly. PELVIC:  External Genitalia: Normal  BUS: Normal  Vagina: Normal; Mild atrophic changes; vaginal cuff intact; no lesions, ulcerations, or abrasions  Cervix: Surgically absent  Uterus: Surgically absent  Adnexa: Normal; no palpable masses or tenderness  RV: Normal external exam; normal sphincter tone; firm stool in the rectal vault  Bladder: Nontender MUSCULOSKELETAL: Normal range of motion. No tenderness.  No cyanosis, clubbing, or edema.     Assessment:   1. Endometrial cancer ([place])  2. Vaginal bleeding     Plan:  1. Urinalysis with urine culture 2. Stool guaiac cards for assessment of rectal bleeding 3. Monitor for any recurrent symptoms 4. Return next month for 6 month interval exam for monitoring of endometrial cancer history  A total of 25 minutes were spent face-to-face with the patient during this encounter and over half of that time involved counseling and coordination of care.  Brayton Mars, MD  Note: This dictation was prepared with Dragon dictation along with smaller phrase technology.  Any transcriptional errors that result from this process are unintentional.

## 2016-12-02 NOTE — Patient Instructions (Addendum)
1. Urinalysis and urine culture is obtained to rule out bleeding source 2. Stool guaiac cards are given for assessment of rectal bleeding 3. Return for 6 month interval physical As scheduled

## 2016-12-04 LAB — URINE CULTURE: Organism ID, Bacteria: NO GROWTH

## 2016-12-13 LAB — FECAL OCCULT BLOOD, IMMUNOCHEMICAL: FECAL OCCULT BLD: NEGATIVE

## 2016-12-24 ENCOUNTER — Ambulatory Visit: Payer: Managed Care, Other (non HMO) | Admitting: Obstetrics and Gynecology

## 2017-01-01 ENCOUNTER — Encounter: Payer: Self-pay | Admitting: Obstetrics and Gynecology

## 2017-01-01 ENCOUNTER — Ambulatory Visit (INDEPENDENT_AMBULATORY_CARE_PROVIDER_SITE_OTHER): Payer: Managed Care, Other (non HMO) | Admitting: Obstetrics and Gynecology

## 2017-01-01 VITALS — BP 121/77 | HR 73 | Ht 62.5 in | Wt 180.9 lb

## 2017-01-01 DIAGNOSIS — Z90722 Acquired absence of ovaries, bilateral: Secondary | ICD-10-CM | POA: Diagnosis not present

## 2017-01-01 DIAGNOSIS — Z8542 Personal history of malignant neoplasm of other parts of uterus: Secondary | ICD-10-CM | POA: Diagnosis not present

## 2017-01-01 DIAGNOSIS — C541 Malignant neoplasm of endometrium: Secondary | ICD-10-CM | POA: Diagnosis not present

## 2017-01-01 DIAGNOSIS — Z90710 Acquired absence of both cervix and uterus: Secondary | ICD-10-CM | POA: Diagnosis not present

## 2017-01-01 DIAGNOSIS — Z9079 Acquired absence of other genital organ(s): Secondary | ICD-10-CM

## 2017-01-01 NOTE — Progress Notes (Signed)
Chief complaint: 1. History of endometrial cancer, stage I, status post robotically-assisted TL H BSO with lymph node sampling 2. Recent history of vaginal bleeding; etiology not determined  Patient presents for 6 month follow-up on endometrial cancer. Bowel function is normal Bladder function is normal. Previously noted vaginal bleeding has not recurred: Stool guaiac R's in the interim were negative.  Endometrial cancer management summary:  Endometrial cancer Management summary:             07/26/2012, hysteroscopy/D&C . Pathology: Endometrioid adenocarcinoma with mucinous differentiation, grade 1             08/2012. Robotically-assisted total laparoscopic hysterectomy, BSO, sentinel lymph node dissection.             05/04/2013. No evidence of disease.             02/22/2014. No evidence of disease  Interval workup by Dr. Rosanna Randy, primary care: 10/01/2015 CT abdomen and pelvis with contrast-no acute findings  02/12/2016 abdominal ultrasound-negative gallstones; probable fatty infiltration of labor; no acute findings 12/07/2016 stool guaiac cards negative 01/01/2017 NED  Past Medical History:  Diagnosis Date  . Cancer [facility])    Endometrial  . DDD (degenerative disc disease), lumbar    Past Surgical History:  Procedure Laterality Date  . ABDOMINAL HYSTERECTOMY     total  . BREAST CYST ASPIRATION Bilateral   . BUNIONECTOMY    . BUNIONECTOMY     With hammer toe repair  . CYST REMOVAL NECK    . HAMMER TOE SURGERY    . TONSILLECTOMY AND ADENOIDECTOMY     Review of Systems  Constitutional: Negative for chills, diaphoresis, fever and weight loss.  Respiratory: Negative.   Cardiovascular: Negative.   Gastrointestinal: Negative.  Negative for abdominal pain.  Genitourinary: Negative.   Musculoskeletal: Negative.   Skin: Negative.   Neurological: Negative.   Endo/Heme/Allergies: Negative.   Psychiatric/Behavioral: Negative.    OBJECTIVE: BP 121/77   Pulse 73   Ht 5' 2.5"  (1.588 m)   Wt 180 lb 14.4 oz (82.1 kg)   BMI 32.56 kg/m  Pleasant well-appearing female in no acute rest. Alert and oriented. HEENT: Normocephalic atraumatic Neck: Supple without thyromegaly or adenopathy Lungs: Clear Heart: Regular rate and rhythm without murmur Abdomen: Soft, nontender without organomegaly; no hernias Pelvic: External genitalia-normal BUS-normal Vagina-decreased estrogen effect; first degree cystocele; no rectocele; no lesions; introitus stenotic (slight) Cervix-surgically absent Uterus-surgically absent Adnexa-no palpable masses or tenderness Rectovaginal-normal external exam; normal sphincter tone; no rectal masses Extremities-warm and dry Skin-without rash  ASSESSMENT: 1. Endometrioid endometrial cancer, stage I, status post robotically-assisted TL H BSO with lymph node sampling 2. No evidence of disease 3. Vaginal atrophy 4. History of vaginal bleeding, uncertain etiology, without recurrence.  PLAN: 1. Continue with healthy eating, exercise 2. Return in 6 months for follow-up/annual exam  Brayton Mars, MD  Note: This dictation was prepared with Dragon dictation along with smaller phrase technology. Any transcriptional errors that result from this process are unintentional.

## 2017-01-01 NOTE — Patient Instructions (Signed)
1. Return in 6 months for annual exam

## 2017-06-13 ENCOUNTER — Encounter: Payer: Self-pay | Admitting: Emergency Medicine

## 2017-06-13 ENCOUNTER — Observation Stay
Admission: EM | Admit: 2017-06-13 | Discharge: 2017-06-15 | Disposition: A | Discharge status: Home / Self Care | Payer: Managed Care, Other (non HMO) | Attending: Internal Medicine | Admitting: Internal Medicine

## 2017-06-13 DIAGNOSIS — R1030 Lower abdominal pain, unspecified: Secondary | ICD-10-CM | POA: Diagnosis not present

## 2017-06-13 DIAGNOSIS — E785 Hyperlipidemia, unspecified: Secondary | ICD-10-CM | POA: Insufficient documentation

## 2017-06-13 DIAGNOSIS — Z802 Family history of malignant neoplasm of other respiratory and intrathoracic organs: Secondary | ICD-10-CM | POA: Insufficient documentation

## 2017-06-13 DIAGNOSIS — Z90710 Acquired absence of both cervix and uterus: Secondary | ICD-10-CM | POA: Diagnosis not present

## 2017-06-13 DIAGNOSIS — E669 Obesity, unspecified: Secondary | ICD-10-CM | POA: Insufficient documentation

## 2017-06-13 DIAGNOSIS — Z791 Long term (current) use of non-steroidal anti-inflammatories (NSAID): Secondary | ICD-10-CM | POA: Insufficient documentation

## 2017-06-13 DIAGNOSIS — Z79899 Other long term (current) drug therapy: Secondary | ICD-10-CM | POA: Diagnosis not present

## 2017-06-13 DIAGNOSIS — Z803 Family history of malignant neoplasm of breast: Secondary | ICD-10-CM | POA: Diagnosis not present

## 2017-06-13 DIAGNOSIS — Z6833 Body mass index (BMI) 33.0-33.9, adult: Secondary | ICD-10-CM | POA: Diagnosis not present

## 2017-06-13 DIAGNOSIS — F419 Anxiety disorder, unspecified: Secondary | ICD-10-CM | POA: Diagnosis not present

## 2017-06-13 DIAGNOSIS — Z825 Family history of asthma and other chronic lower respiratory diseases: Secondary | ICD-10-CM | POA: Diagnosis not present

## 2017-06-13 DIAGNOSIS — Z8052 Family history of malignant neoplasm of bladder: Secondary | ICD-10-CM | POA: Diagnosis not present

## 2017-06-13 DIAGNOSIS — K529 Noninfective gastroenteritis and colitis, unspecified: Secondary | ICD-10-CM | POA: Diagnosis not present

## 2017-06-13 DIAGNOSIS — Z8249 Family history of ischemic heart disease and other diseases of the circulatory system: Secondary | ICD-10-CM | POA: Diagnosis not present

## 2017-06-13 DIAGNOSIS — E559 Vitamin D deficiency, unspecified: Secondary | ICD-10-CM | POA: Diagnosis not present

## 2017-06-13 DIAGNOSIS — Z8589 Personal history of malignant neoplasm of other organs and systems: Secondary | ICD-10-CM | POA: Insufficient documentation

## 2017-06-13 DIAGNOSIS — K921 Melena: Principal | ICD-10-CM | POA: Insufficient documentation

## 2017-06-13 DIAGNOSIS — Z833 Family history of diabetes mellitus: Secondary | ICD-10-CM | POA: Insufficient documentation

## 2017-06-13 DIAGNOSIS — Z8042 Family history of malignant neoplasm of prostate: Secondary | ICD-10-CM | POA: Diagnosis not present

## 2017-06-13 LAB — COMPREHENSIVE METABOLIC PANEL
ALBUMIN: 3.7 g/dL (ref 3.5–5.0)
ALK PHOS: 78 U/L (ref 38–126)
ALT: 31 U/L (ref 14–54)
ANION GAP: 10 (ref 5–15)
AST: 39 U/L (ref 15–41)
BILIRUBIN TOTAL: 0.9 mg/dL (ref 0.3–1.2)
BUN: 12 mg/dL (ref 6–20)
CALCIUM: 9.1 mg/dL (ref 8.9–10.3)
CO2: 23 mmol/L (ref 22–32)
Chloride: 105 mmol/L (ref 101–111)
Creatinine, Ser: 0.68 mg/dL (ref 0.44–1.00)
GFR calc Af Amer: >60 mL/min (ref >60)
GFR calc non Af Amer: >60 mL/min (ref >60)
GLUCOSE: 148 mg/dL — AB (ref 65–99)
POTASSIUM: 4 mmol/L (ref 3.5–5.1)
SODIUM: 138 mmol/L (ref 135–145)
TOTAL PROTEIN: 6.7 g/dL (ref 6.5–8.1)

## 2017-06-13 LAB — CBC
HEMATOCRIT: 39.1 % (ref 35.0–47.0)
HEMOGLOBIN: 13.8 g/dL (ref 12.0–16.0)
MCH: 32.1 pg (ref 26.0–34.0)
MCHC: 35.2 g/dL (ref 32.0–36.0)
MCV: 91.1 fL (ref 80.0–100.0)
Platelets: 281 10*3/uL (ref 150–440)
RBC: 4.3 MIL/uL (ref 3.80–5.20)
RDW: 13.6 % (ref 11.5–14.5)
WBC: 9.5 10*3/uL (ref 3.6–11.0)

## 2017-06-13 LAB — PROTIME-INR
INR: 0.94
PROTHROMBIN TIME: 12.6 s (ref 11.4–15.2)

## 2017-06-13 LAB — APTT: APTT: 27 s (ref 24–36)

## 2017-06-13 NOTE — ED Triage Notes (Signed)
Pt reports 3 episodes of rectal bleeding today; pt says she has issues with constipation, and sometimes goes through periods where she only has a bowel movement every 3-4 days; pt says this is a very different situation due to the bleeding; today she's had diarrhea and now just passing blood rectally; c/o cramping pain across lower abd, sharp pains to left lower quadrant; denies urinary s/s;

## 2017-06-14 ENCOUNTER — Encounter: Payer: Self-pay | Admitting: Radiology

## 2017-06-14 ENCOUNTER — Emergency Department: Payer: Managed Care, Other (non HMO)

## 2017-06-14 DIAGNOSIS — R933 Abnormal findings on diagnostic imaging of other parts of digestive tract: Secondary | ICD-10-CM | POA: Diagnosis not present

## 2017-06-14 DIAGNOSIS — K921 Melena: Secondary | ICD-10-CM | POA: Diagnosis not present

## 2017-06-14 LAB — C DIFFICILE QUICK SCREEN W PCR REFLEX
C Diff antigen: NEGATIVE
C Diff interpretation: NOT DETECTED
C Diff toxin: NEGATIVE

## 2017-06-14 LAB — LIPASE, BLOOD: Lipase: 29 U/L (ref 11–51)

## 2017-06-14 LAB — GASTROINTESTINAL PANEL BY PCR, STOOL (REPLACES STOOL CULTURE)
ASTROVIRUS: NOT DETECTED
Adenovirus F40/41: NOT DETECTED
Campylobacter species: NOT DETECTED
Cryptosporidium: NOT DETECTED
Cyclospora cayetanensis: NOT DETECTED
ENTEROPATHOGENIC E COLI (EPEC): NOT DETECTED
Entamoeba histolytica: NOT DETECTED
Enteroaggregative E coli (EAEC): NOT DETECTED
Enterotoxigenic E coli (ETEC): NOT DETECTED
Giardia lamblia: NOT DETECTED
NOROVIRUS GI/GII: NOT DETECTED
Plesimonas shigelloides: NOT DETECTED
ROTAVIRUS A: NOT DETECTED
SALMONELLA SPECIES: NOT DETECTED
SAPOVIRUS (I, II, IV, AND V): NOT DETECTED
SHIGA LIKE TOXIN PRODUCING E COLI (STEC): NOT DETECTED
SHIGELLA/ENTEROINVASIVE E COLI (EIEC): NOT DETECTED
Vibrio cholerae: NOT DETECTED
Vibrio species: NOT DETECTED
Yersinia enterocolitica: NOT DETECTED

## 2017-06-14 LAB — TYPE AND SCREEN
ABO/RH(D): A POS
Antibody Screen: NEGATIVE

## 2017-06-14 LAB — MRSA PCR SCREENING: MRSA by PCR: NEGATIVE

## 2017-06-14 LAB — TSH: TSH: 2.776 u[IU]/mL (ref 0.350–4.500)

## 2017-06-14 MED ORDER — PEG 3350-KCL-NA BICARB-NACL 420 G PO SOLR
4000.0000 mL | Freq: Once | ORAL | Status: AC
  Administered 2017-06-14: 4000 mL via ORAL
  Filled 2017-06-14: qty 4000

## 2017-06-14 MED ORDER — CYCLOSPORINE 0.05 % OP EMUL
1.0000 [drp] | Freq: Two times a day (BID) | OPHTHALMIC | Status: DC
  Administered 2017-06-14 – 2017-06-15 (×3): 1 [drp] via OPHTHALMIC
  Filled 2017-06-14 (×5): qty 1

## 2017-06-14 MED ORDER — SODIUM CHLORIDE 0.9 % IV SOLN
INTRAVENOUS | Status: DC
  Administered 2017-06-14 (×2): via INTRAVENOUS

## 2017-06-14 MED ORDER — ACETAMINOPHEN 325 MG PO TABS: 650.0000 mg | ORAL_TABLET | Freq: Four times a day (QID) | ORAL | Status: DC | PRN

## 2017-06-14 MED ORDER — CIPROFLOXACIN IN D5W 400 MG/200ML IV SOLN
400.0000 mg | Freq: Two times a day (BID) | INTRAVENOUS | Status: DC
  Administered 2017-06-14 – 2017-06-15 (×3): 400 mg via INTRAVENOUS
  Filled 2017-06-14 (×4): qty 200

## 2017-06-14 MED ORDER — PANTOPRAZOLE SODIUM 40 MG PO TBEC
40.0000 mg | DELAYED_RELEASE_TABLET | Freq: Two times a day (BID) | ORAL | Status: DC
  Administered 2017-06-14 – 2017-06-15 (×3): 40 mg via ORAL
  Filled 2017-06-14 (×3): qty 1

## 2017-06-14 MED ORDER — SODIUM CHLORIDE 0.9 % IV SOLN
INTRAVENOUS | Status: DC
  Administered 2017-06-15: 1000 mL via INTRAVENOUS
  Administered 2017-06-15: 09:00:00 via INTRAVENOUS

## 2017-06-14 MED ORDER — MORPHINE SULFATE (PF) 4 MG/ML IV SOLN
INTRAVENOUS | Status: AC
  Filled 2017-06-14: qty 1

## 2017-06-14 MED ORDER — SULFASALAZINE 500 MG PO TBEC
500.0000 mg | DELAYED_RELEASE_TABLET | Freq: Four times a day (QID) | ORAL | Status: DC
  Administered 2017-06-14 – 2017-06-15 (×5): 500 mg via ORAL
  Filled 2017-06-14 (×6): qty 1

## 2017-06-14 MED ORDER — MORPHINE SULFATE (PF) 4 MG/ML IV SOLN
4.0000 mg | Freq: Once | INTRAVENOUS | Status: AC
  Administered 2017-06-14: 4 mg via INTRAVENOUS

## 2017-06-14 MED ORDER — ONDANSETRON HCL 4 MG/2ML IJ SOLN
4.0000 mg | Freq: Four times a day (QID) | INTRAMUSCULAR | Status: DC | PRN
Start: 2017-06-14 — End: 2017-06-15

## 2017-06-14 MED ORDER — ACETAMINOPHEN 650 MG RE SUPP
650.0000 mg | Freq: Four times a day (QID) | RECTAL | Status: DC | PRN
Start: 2017-06-14 — End: 2017-06-15

## 2017-06-14 MED ORDER — LORATADINE 10 MG PO TABS
10.0000 mg | ORAL_TABLET | Freq: Every day | ORAL | Status: DC
  Filled 2017-06-14: qty 1

## 2017-06-14 MED ORDER — MORPHINE SULFATE (PF) 4 MG/ML IV SOLN
4.0000 mg | Freq: Once | INTRAVENOUS | Status: AC
  Administered 2017-06-14: 4 mg via INTRAVENOUS
  Filled 2017-06-14: qty 1

## 2017-06-14 MED ORDER — POLYETHYLENE GLYCOL 3350 17 G PO PACK: 17.0000 g | PACK | Freq: Every day | ORAL | Status: DC | PRN

## 2017-06-14 MED ORDER — METRONIDAZOLE IN NACL 5-0.79 MG/ML-% IV SOLN
500.0000 mg | Freq: Three times a day (TID) | INTRAVENOUS | Status: DC
  Administered 2017-06-14 – 2017-06-15 (×3): 500 mg via INTRAVENOUS
  Filled 2017-06-14 (×6): qty 100

## 2017-06-14 MED ORDER — ONDANSETRON HCL 4 MG/2ML IJ SOLN
4.0000 mg | INTRAMUSCULAR | Status: AC
  Administered 2017-06-14: 4 mg via INTRAVENOUS
  Filled 2017-06-14: qty 2

## 2017-06-14 MED ORDER — ONDANSETRON HCL 4 MG PO TABS
4.0000 mg | ORAL_TABLET | Freq: Four times a day (QID) | ORAL | Status: DC | PRN
Start: 2017-06-14 — End: 2017-06-15

## 2017-06-14 MED ORDER — IOPAMIDOL (ISOVUE-300) INJECTION 61%
100.0000 mL | Freq: Once | INTRAVENOUS | Status: AC | PRN
  Administered 2017-06-14: 100 mL via INTRAVENOUS

## 2017-06-14 MED ORDER — IOPAMIDOL (ISOVUE-300) INJECTION 61%
30.0000 mL | Freq: Once | INTRAVENOUS | Status: AC
  Administered 2017-06-14: 30 mL via ORAL

## 2017-06-14 MED ORDER — BISACODYL 5 MG PO TBEC
20.0000 mg | DELAYED_RELEASE_TABLET | Freq: Once | ORAL | Status: AC
  Administered 2017-06-14: 20 mg via ORAL
  Filled 2017-06-14: qty 4

## 2017-06-14 NOTE — ED Notes (Signed)
MD Karma Greaser discussed plan of care with patient an family

## 2017-06-14 NOTE — ED Notes (Signed)
ED Provider at bedside. 

## 2017-06-14 NOTE — ED Provider Notes (Signed)
[facility] Emergency Department Provider Note  ____________________________________________   First MD Initiated Contact with Patient 06/14/17 0004     (approximate)  I have reviewed the triage vital signs and the nursing notes.   HISTORY  Chief Complaint Abdominal Pain and Rectal Bleeding    HPI Angela Thomas is a 56 y.o. female with medical history as listed below who presents for evaluation ofgradually worsening abdominal pain for about 3 weeks with diarrhea for about the same time.  She states that it is gradually gotten worse recently and she went from having several loose stools every 3-4 days to now having 4 or 5 loose stools a day.  What caused her to come to the emergency department tonight was the onset of bright red blood per rectum.  This occurred earlier today and at first the lead was mixed with the stool, but she reports that she had 3 separate urgent bowel movements at home that seemed to be nothing but blood and clots.  She cannot estimate the amount but was very concerned by what she saw.  At the same time the dull aching pain in her lower abdomen, worse on the left than on the right, has gotten worse and is now moderate to severe.  Nothing in particular makes the patient's symptoms better but she does feel that the diarrhea gets worse immediately after eating.  She has had multiple episodes of bloody diarrhea since coming to the emergency department.  She denies fever/chills, chest pain, shortness of breath, nausea, vomiting.  She has not been doing any travel recently and has had no unusual food exposures or sick contacts.  She had a colonoscopy about 8 years ago which was unremarkable but does not see a GI doctor regularly.  She has no history of diverticular disease.   Past Medical History:  Diagnosis Date  . Cancer [facility])    Endometrial  . DDD (degenerative disc disease), lumbar     Patient Active Problem List   Diagnosis Date Noted  .  Hematochezia 06/14/2017  . Other fatigue 06/18/2016  . Vaginal atrophy 06/18/2016  . UTI symptoms 06/18/2016  . DDD (degenerative disc disease), lumbar 04/12/2015  . Vitamin D deficiency 04/12/2015  . Obesity 04/12/2015  . Acute anxiety 04/12/2015  . Allergic rhinitis 04/12/2015  . Fibrocystic breast disease 04/12/2015  . HLD (hyperlipidemia) 02/18/2013  . Arthritis 02/18/2013  . Cancer of endometrium ([place]) 08/02/2012  . Endometrial cancer ([place]) 08/02/2012    Past Surgical History:  Procedure Laterality Date  . ABDOMINAL HYSTERECTOMY     total  . BREAST CYST ASPIRATION Bilateral   . BUNIONECTOMY    . BUNIONECTOMY     With hammer toe repair  . CYST REMOVAL NECK    . HAMMER TOE SURGERY    . TONSILLECTOMY AND ADENOIDECTOMY      Prior to Admission medications   Medication Sig Start Date End Date Taking? Authorizing Provider  cycloSPORINE (RESTASIS) 0.05 % ophthalmic emulsion Place 1 drop into both eyes every 12 (twelve) hours.   Yes [provider]  Diclofenac-Misoprostol (ARTHROTEC) 75-0.2 MG TBEC Take 1 tablet by mouth 2 (two) times daily. 08/06/16  Yes Jerrol Banana., MD  fexofenadine (ALLEGRA) 180 MG tablet Take 1 tablet (180 mg total) by mouth daily. 08/06/16  Yes Jerrol Banana., MD  ALPRAZolam Duanne Moron) 0.25 MG tablet Take 1 tablet (0.25 mg total) by mouth every 8 (eight) hours as needed for anxiety. Patient not taking: Reported  on 06/14/2017 08/06/16   Jerrol Banana., MD  valACYclovir (VALTREX) 500 MG tablet Take 1 tablet (500 mg total) by mouth 2 (two) times daily as needed. Patient not taking: Reported on 06/14/2017 04/16/15   Jerrol Banana., MD    Allergies Beef-derived products; Other; and Pork-derived products  Family History  Problem Relation Age of Onset  . COPD Mother   . COPD Father   . Diabetes Father   . Pulmonary fibrosis Father   . Bladder Cancer Father   . Breast cancer Maternal Aunt   . Dementia Maternal Aunt   .  Transient ischemic attack Maternal Uncle   . Heart disease Maternal Uncle   . Cancer Maternal Uncle        laryngeal  . Heart disease Maternal Grandfather   . Diabetes Paternal Grandfather   . Prostate cancer Paternal Grandfather   . Other Paternal Grandmother        cerebral hemorrhage    Social History Social History  Substance Use Topics  . Smoking status: Never Smoker  . Smokeless tobacco: Never Used  . Alcohol use 0.6 oz/week    1 Glasses of wine per week     Comment: 1-2 week    Review of Systems Constitutional: No fever/chills Eyes: No visual changes. ENT: No sore throat. Cardiovascular: Denies chest pain. Respiratory: Denies shortness of breath. Gastrointestinal: 3 weeks of gradually worsening lower abdominal pain and diarrhea with acute onset of bright red blood per rectum starting yesterday and which is occurred multiple times Genitourinary: Negative for dysuria. Musculoskeletal: Negative for neck pain.  Negative for back pain. Integumentary: Negative for rash. Neurological: Negative for headaches, focal weakness or numbness.   ____________________________________________   PHYSICAL EXAM:  VITAL SIGNS: ED Triage Vitals [06/13/17 2223]  Enc Vitals Group     BP (!) 192/89     Pulse Rate 94     Resp 18     Temp 97.8 F (36.6 C)     Temp Source Oral     SpO2 99 %     Weight 78.9 kg (174 lb)     Height 1.549 m (5\' 1" )     Head Circumference      Peak Flow      Pain Score 8     Pain Loc      Pain Edu?      Excl. in [place]?     Constitutional: Alert and oriented. No acute distress but does appear uncomfortable Eyes: Conjunctivae are normal.  Head: Atraumatic. Nose: No congestion/rhinnorhea. Mouth/Throat: Mucous membranes are moist. Neck: No stridor.  No meningeal signs.   Cardiovascular: Borderline tachycardia, regular rhythm. Good peripheral circulation. Grossly normal heart sounds. Respiratory: Normal respiratory effort.  No retractions. Lungs  CTAB. Gastrointestinal: Soft with diffuse tenderness throughout the lower abdomen worse on the left than the right.  No peritonitis Rectal:  The area around the patient's anus is red and inflamed with any evidence of active infection and is likely secondary to her frequent diarrhea.  Digital rectal exam is nontender.  There is what appears to be extensive liquid stool present in the rectal vault and there is no gross blood present but the sample was strongly Hemoccult positive.    ED chaperone present throughout exam.    Musculoskeletal: No lower extremity tenderness nor edema. No gross deformities of extremities. Neurologic:  Normal speech and language. No gross focal neurologic deficits are appreciated.  Skin:  Skin is warm, dry and intact. No  rash noted. Psychiatric: Mood and affect are normal. Speech and behavior are normal.  ____________________________________________   LABS (all labs ordered are listed, but only abnormal results are displayed)  Labs Reviewed  COMPREHENSIVE METABOLIC PANEL - Abnormal; Notable for the following:       Result Value   Glucose, Bld 148 (*)    All other components within normal limits  GASTROINTESTINAL PANEL BY PCR, STOOL (REPLACES STOOL CULTURE)  C DIFFICILE QUICK SCREEN W PCR REFLEX  MRSA PCR SCREENING  CBC  PROTIME-INR  APTT  LIPASE, BLOOD  TSH  MISC [facility] TEST (SEND OUT)  POC OCCULT BLOOD, ED  TYPE AND SCREEN   ____________________________________________  EKG  None - EKG not ordered by ED physician ____________________________________________  RADIOLOGY   Ct Abdomen Pelvis W Contrast  Result Date: 06/14/2017 CLINICAL DATA:  Acute onset of rectal bleeding and diarrhea. Lower abdominal cramping. EXAM: CT ABDOMEN AND PELVIS WITH CONTRAST TECHNIQUE: Multidetector CT imaging of the abdomen and pelvis was performed using the standard protocol following bolus administration of intravenous contrast. CONTRAST:  182mL ISOVUE-300 IOPAMIDOL  (ISOVUE-300) INJECTION 61% COMPARISON:  CT of the abdomen and pelvis from 10/01/2015, and abdominal ultrasound performed 02/12/2016 FINDINGS: Lower chest: The visualized lung bases are grossly clear. The visualized portions of the mediastinum are unremarkable. Hepatobiliary: The liver is unremarkable in appearance. The gallbladder is unremarkable in appearance. The common bile duct remains normal in caliber. Pancreas: The pancreas is within normal limits. Spleen: The spleen is unremarkable in appearance. Adrenals/Urinary Tract: The adrenal glands are unremarkable in appearance. The kidneys are within normal limits. There is no evidence of hydronephrosis. No renal or ureteral stones are identified. Minimal nonspecific perinephric stranding is noted bilaterally. Stomach/Bowel: The stomach is unremarkable in appearance. The small bowel is within normal limits. The appendix is not visualized; there is no evidence for appendicitis. Mild wall thickening is noted along the cecum, ascending colon and transverse colon, concerning for mild acute infectious or inflammatory colitis. Vascular/Lymphatic: The abdominal aorta is unremarkable in appearance. The inferior vena cava is grossly unremarkable. No retroperitoneal lymphadenopathy is seen. No pelvic sidewall lymphadenopathy is identified. Reproductive: The bladder is moderately distended and grossly unremarkable. The patient is status post hysterectomy. No suspicious adnexal masses are seen. Other: No additional soft tissue abnormalities are seen. Musculoskeletal: No acute osseous abnormalities are identified. Endplate sclerotic change is noted at L5-S1. The visualized musculature is unremarkable in appearance. IMPRESSION: Mild wall thickening along the cecum, ascending colon and transverse colon, concerning for mild infectious or inflammatory colitis. Electronically Signed   By: Garald Balding M.D.   On: 06/14/2017 02:23     ____________________________________________   PROCEDURES  Critical Care performed: No   Procedure(s) performed:   Procedures   ____________________________________________   INITIAL IMPRESSION / ASSESSMENT AND PLAN / ED COURSE  Pertinent labs & imaging results that were available during my care of the patient were reviewed by me and considered in my medical decision making (see chart for details).  Initially after seeing the patient was concerned that she may have chronic diverticulitis which may have recently perforated although this does not necessarily explain the blood.  Of course the usual concerns for acute or emergent causes of bright red blood per rectum or also possible including but not limited to diverticular bleed, AV malformation, neoplasm, etc. she has no other infectious symptoms but the duration of her diarrhea is somewhat concerning as well.  There is no history of inflammatory bowel disease.   I will  proceed with a CT scan with oral and IV contrast to further evaluate the abdomen and then reassess.  Patient agrees with the plan  ----------------------------------------- 3:50 AM on 06/14/2017 -----------------------------------------  CT scan demonstrates colitis but without any evidence of diverticulitis, perforation, abscess, etc.  The patient has had several additional bowel movements in the ED, some of which were bloody.  Had an extensive discussion with her and her daughters and given that she has had this acute onset hematochezia without explanation in the setting of 3 weeks of diarrhea, we agreed that she would benefit from observation for serial H&H, GI consults, stool cultures, etc.  No indication for empiric antibiotics at this time until stool cultures are available.  I discussed the case with Dr. Marcille Blanco with the hospitalist service who will admit.   ____________________________________________  FINAL CLINICAL IMPRESSION(S) / ED DIAGNOSES  Final  diagnoses:  Hematochezia  Lower abdominal pain  Colitis  Chronic diarrhea     MEDICATIONS GIVEN DURING THIS VISIT:  Medications  cycloSPORINE (RESTASIS) 0.05 % ophthalmic emulsion 1 drop (not administered)  loratadine (CLARITIN) tablet 10 mg (not administered)  0.9 %  sodium chloride infusion ( Intravenous New Bag/Given 06/14/17 1610)  acetaminophen (TYLENOL) tablet 650 mg (not administered)    Or  acetaminophen (TYLENOL) suppository 650 mg (not administered)  ondansetron (ZOFRAN) tablet 4 mg (not administered)    Or  ondansetron (ZOFRAN) injection 4 mg (not administered)  polyethylene glycol (MIRALAX / GLYCOLAX) packet 17 g (not administered)  sulfaSALAzine (AZULFIDINE) EC tablet 500 mg (not administered)  pantoprazole (PROTONIX) EC tablet 40 mg (not administered)  morphine 4 MG/ML injection 4 mg (4 mg Intravenous Given 06/14/17 0104)  ondansetron (ZOFRAN) injection 4 mg (4 mg Intravenous Given 06/14/17 0102)  iopamidol (ISOVUE-300) 61 % injection 30 mL (30 mLs Oral Contrast Given 06/14/17 0058)  iopamidol (ISOVUE-300) 61 % injection 100 mL (100 mLs Intravenous Contrast Given 06/14/17 0206)  morphine 4 MG/ML injection 4 mg (4 mg Intravenous Given 06/14/17 0410)     NEW OUTPATIENT MEDICATIONS STARTED DURING THIS VISIT:  Current Discharge Medication List      Current Discharge Medication List      Current Discharge Medication List       Note:  This document was prepared using Dragon voice recognition software and may include unintentional dictation errors.    Hinda Kehr, MD 06/14/17 [phone]

## 2017-06-14 NOTE — ED Notes (Signed)
Patient c/o abdominal pain X 3 weeks. Pt reports 3 episodes of rectal bleeding today. Large clumps of blood present.

## 2017-06-14 NOTE — Consult Note (Signed)
Reason for Consult: Bloody diarrhea Referring Physician: Dr. Alger Memos Angela Thomas is an 56 y.o. female.  HPI: Seen in consultation at the request of Dr. Bridgett Larsson for bloody diarrhea. The history is obtained through the patient, her family at the bedside, and review of EPIC.  She has a long standing history of IBS (diagnosed with negative colonoscopy 30 year ago) as well as a lifetime history of constipation requiring daily senna for the last 2-3 years. She had a normal colonoscopy at age 1 with Dr. Vira Agar.  She reports a 3 months history of diarrhea. Symptoms were severe for several days at the end of July following by a short respite. Recurrent approximately 7 days ago with the return of diarrhea. Yesterday it became frankly bloody and she presented to the emergency room. There is associated, nonradiating LLQ abdominal cramping.   There is associated bloating. No other identified exacerbating or relieving features. No extra GI manifestations of IBD. Appetite and energy were normal prior to the onset of symptoms.  A CT scan in the ED showed colon thickening involving the cecum, ascending colon and transverse colon. A CMP and CBC is normal.   There is no family history of inflammatory bowel disease or autoimmune disease. There is no family history of colon cancer or polyps.  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 She uses [employer] for DJD and sciatica. She also uses [employer] vitamins.   Past Medical History:  Diagnosis Date  . Cancer [facility])    Endometrial  . DDD (degenerative disc disease), lumbar     Past Surgical History:  Procedure Laterality Date  . ABDOMINAL HYSTERECTOMY     total  . BREAST CYST ASPIRATION Bilateral   . BUNIONECTOMY    . BUNIONECTOMY     With hammer toe repair  . CYST REMOVAL NECK    . HAMMER TOE SURGERY    . TONSILLECTOMY AND ADENOIDECTOMY      Family History  Problem Relation Age of Onset  .  COPD Mother   . COPD Father   . Diabetes Father   . Pulmonary fibrosis Father   . Bladder Cancer Father   . Breast cancer Maternal Aunt   . Dementia Maternal Aunt   . Transient ischemic attack Maternal Uncle   . Heart disease Maternal Uncle   . Cancer Maternal Uncle        laryngeal  . Heart disease Maternal Grandfather   . Diabetes Paternal Grandfather   . Prostate cancer Paternal Grandfather   . Other Paternal Grandmother        cerebral hemorrhage    Social History:  reports that she has never smoked. She has never used smokeless tobacco. She reports that she drinks about 0.6 oz of alcohol per week . She reports that she does not use drugs.  Allergies:  Allergies  Allergen Reactions  . Beef-Derived Products Anaphylaxis  . Other Anaphylaxis    Reaction to red meat and pork products  . Pork-Derived Products Anaphylaxis    Medications:  I have reviewed the patient's current medications. Prior to Admission:  Prescriptions Prior to Admission  Medication Sig Dispense Refill Last Dose  . cycloSPORINE (RESTASIS) 0.05 % ophthalmic emulsion Place 1 drop into both eyes every 12 (twelve) hours.   06/13/2017 at Unknown time  . Diclofenac-Misoprostol (ARTHROTEC) 75-0.2 MG TBEC Take 1 tablet by mouth 2 (two) times daily. 60 tablet 12 06/13/2017 at Unknown time  .  fexofenadine (ALLEGRA) 180 MG tablet Take 1 tablet (180 mg total) by mouth daily. 30 tablet 12 06/13/2017 at Unknown time  . ALPRAZolam (XANAX) 0.25 MG tablet Take 1 tablet (0.25 mg total) by mouth every 8 (eight) hours as needed for anxiety. (Patient not taking: Reported on 06/14/2017) 30 tablet 5 Not Taking at Unknown time  . valACYclovir (VALTREX) 500 MG tablet Take 1 tablet (500 mg total) by mouth 2 (two) times daily as needed. (Patient not taking: Reported on 06/14/2017) 60 tablet 12 Not Taking at Unknown time   Scheduled: . cycloSPORINE  1 drop Both Eyes Q12H  . loratadine  10 mg Oral Daily  . pantoprazole  40 mg Oral BID  .  sulfaSALAzine  500 mg Oral QID   Continuous: . sodium chloride 100 mL/hr at 06/14/17 1800  . sodium chloride    . ciprofloxacin Stopped (06/14/17 1142)  . metronidazole Stopped (06/14/17 1305)   QPR:[id number] **OR** acetaminophen, ondansetron **OR** ondansetron (ZOFRAN) IV, polyethylene glycol  Results for orders placed or performed during the hospital encounter of 06/13/17 (from the past 48 hour(s))  Comprehensive metabolic panel     Status: Abnormal   Collection Time: 06/13/17 10:55 PM  Result Value Ref Range   Sodium 138 135 - 145 mmol/L   Potassium 4.0 3.5 - 5.1 mmol/L    Comment: HEMOLYSIS AT THIS LEVEL MAY AFFECT RESULT   Chloride 105 101 - 111 mmol/L   CO2 23 22 - 32 mmol/L   Glucose, Bld 148 (H) 65 - 99 mg/dL   BUN 12 6 - 20 mg/dL   Creatinine, Ser 0.68 0.44 - 1.00 mg/dL   Calcium 9.1 8.9 - 10.3 mg/dL   Total Protein 6.7 6.5 - 8.1 g/dL   Albumin 3.7 3.5 - 5.0 g/dL   AST 39 15 - 41 U/L    Comment: HEMOLYSIS AT THIS LEVEL MAY AFFECT RESULT   ALT 31 14 - 54 U/L   Alkaline Phosphatase 78 38 - 126 U/L   Total Bilirubin 0.9 0.3 - 1.2 mg/dL    Comment: HEMOLYSIS AT THIS LEVEL MAY AFFECT RESULT   GFR calc non Af Amer >60 >60 mL/min   GFR calc Af Amer >60 >60 mL/min    Comment: (NOTE) The eGFR has been calculated using the CKD EPI equation. This calculation has not been validated in all clinical situations. eGFR's persistently <60 mL/min signify possible Chronic Kidney Disease.    Anion gap 10 5 - 15  CBC     Status: None   Collection Time: 06/13/17 10:55 PM  Result Value Ref Range   WBC 9.5 3.6 - 11.0 K/uL   RBC 4.30 3.80 - 5.20 MIL/uL   Hemoglobin 13.8 12.0 - 16.0 g/dL   HCT 39.1 35.0 - 47.0 %   MCV 91.1 80.0 - 100.0 fL   MCH 32.1 26.0 - 34.0 pg   MCHC 35.2 32.0 - 36.0 g/dL   RDW 13.6 11.5 - 14.5 %   Platelets 281 150 - 440 K/uL  Lipase, blood     Status: None   Collection Time: 06/13/17 10:55 PM  Result Value Ref Range   Lipase 29 11 - 51 U/L  Protime-INR      Status: None   Collection Time: 06/13/17 11:28 PM  Result Value Ref Range   Prothrombin Time 12.6 11.4 - 15.2 seconds   INR 0.94   APTT     Status: None   Collection Time: 06/13/17 11:28 PM  Result Value Ref Range  aPTT 27 24 - 36 seconds  Type and screen     Status: None   Collection Time: 06/13/17 11:28 PM  Result Value Ref Range   ABO/RH(D) A POS    Antibody Screen NEG    Sample Expiration 06/16/2017   TSH     Status: None   Collection Time: 06/13/17 11:28 PM  Result Value Ref Range   TSH 2.776 0.350 - 4.500 uIU/mL    Comment: Performed by a 3rd Generation assay with a functional sensitivity of <=0.01 uIU/mL.  Gastrointestinal Panel by PCR , Stool     Status: None   Collection Time: 06/14/17  4:10 AM  Result Value Ref Range   Campylobacter species NOT DETECTED NOT DETECTED   Plesimonas shigelloides NOT DETECTED NOT DETECTED   Salmonella species NOT DETECTED NOT DETECTED   Yersinia enterocolitica NOT DETECTED NOT DETECTED   Vibrio species NOT DETECTED NOT DETECTED   Vibrio cholerae NOT DETECTED NOT DETECTED   Enteroaggregative E coli (EAEC) NOT DETECTED NOT DETECTED   Enteropathogenic E coli (EPEC) NOT DETECTED NOT DETECTED   Enterotoxigenic E coli (ETEC) NOT DETECTED NOT DETECTED   Shiga like toxin producing E coli (STEC) NOT DETECTED NOT DETECTED   Shigella/Enteroinvasive E coli (EIEC) NOT DETECTED NOT DETECTED   Cryptosporidium NOT DETECTED NOT DETECTED   Cyclospora cayetanensis NOT DETECTED NOT DETECTED   Entamoeba histolytica NOT DETECTED NOT DETECTED   Giardia lamblia NOT DETECTED NOT DETECTED   Adenovirus F40/41 NOT DETECTED NOT DETECTED   Astrovirus NOT DETECTED NOT DETECTED   Norovirus GI/GII NOT DETECTED NOT DETECTED   Rotavirus A NOT DETECTED NOT DETECTED   Sapovirus (I, II, IV, and V) NOT DETECTED NOT DETECTED  C difficile quick scan w PCR reflex     Status: None   Collection Time: 06/14/17  4:10 AM  Result Value Ref Range   C Diff antigen NEGATIVE  NEGATIVE   C Diff toxin NEGATIVE NEGATIVE   C Diff interpretation No C. difficile detected.   MRSA PCR Screening     Status: None   Collection Time: 06/14/17  6:24 AM  Result Value Ref Range   MRSA by PCR NEGATIVE NEGATIVE    Comment:        The GeneXpert MRSA Assay (FDA approved for NASAL specimens only), is one component of a comprehensive MRSA colonization surveillance program. It is not intended to diagnose MRSA infection nor to guide or monitor treatment for MRSA infections.     Ct Abdomen Pelvis W Contrast  Result Date: 06/14/2017 CLINICAL DATA:  Acute onset of rectal bleeding and diarrhea. Lower abdominal cramping. EXAM: CT ABDOMEN AND PELVIS WITH CONTRAST TECHNIQUE: Multidetector CT imaging of the abdomen and pelvis was performed using the standard protocol following bolus administration of intravenous contrast. CONTRAST:  14m ISOVUE-300 IOPAMIDOL (ISOVUE-300) INJECTION 61% COMPARISON:  CT of the abdomen and pelvis from 10/01/2015, and abdominal ultrasound performed 02/12/2016 FINDINGS: Lower chest: The visualized lung bases are grossly clear. The visualized portions of the mediastinum are unremarkable. Hepatobiliary: The liver is unremarkable in appearance. The gallbladder is unremarkable in appearance. The common bile duct remains normal in caliber. Pancreas: The pancreas is within normal limits. Spleen: The spleen is unremarkable in appearance. Adrenals/Urinary Tract: The adrenal glands are unremarkable in appearance. The kidneys are within normal limits. There is no evidence of hydronephrosis. No renal or ureteral stones are identified. Minimal nonspecific perinephric stranding is noted bilaterally. Stomach/Bowel: The stomach is unremarkable in appearance. The small bowel is within normal  limits. The appendix is not visualized; there is no evidence for appendicitis. Mild wall thickening is noted along the cecum, ascending colon and transverse colon, concerning for mild acute  infectious or inflammatory colitis. Vascular/Lymphatic: The abdominal aorta is unremarkable in appearance. The inferior vena cava is grossly unremarkable. No retroperitoneal lymphadenopathy is seen. No pelvic sidewall lymphadenopathy is identified. Reproductive: The bladder is moderately distended and grossly unremarkable. The patient is status post hysterectomy. No suspicious adnexal masses are seen. Other: No additional soft tissue abnormalities are seen. Musculoskeletal: No acute osseous abnormalities are identified. Endplate sclerotic change is noted at L5-S1. The visualized musculature is unremarkable in appearance. IMPRESSION: Mild wall thickening along the cecum, ascending colon and transverse colon, concerning for mild infectious or inflammatory colitis. Electronically Signed   By: Garald Balding M.D.   On: 06/14/2017 02:23    Review of Systems  Constitutional: Negative for chills, fever and weight loss.  HENT: Negative for hearing loss and tinnitus.   Eyes: Negative for blurred vision and double vision.  Respiratory: Negative for cough and hemoptysis.   Cardiovascular: Negative for chest pain and palpitations.  Gastrointestinal: Positive for abdominal pain and blood in stool.  Genitourinary: Negative for dysuria, frequency and hematuria.  Musculoskeletal: Negative for myalgias and neck pain.  Skin: Negative for itching and rash.  Neurological: Negative for dizziness and headaches.  Psychiatric/Behavioral: Negative for depression and suicidal ideas.   Blood pressure 115/63, pulse 68, temperature (!) 97.5 F (36.4 C), temperature source Oral, resp. rate 20, height _0  (1.549 m), weight 174 lb (78.9 kg), SpO2 96 %. Physical Exam  Constitutional: She is oriented to person, place, and time. She appears well-developed and well-nourished. No distress.  HENT:  Head: Normocephalic and atraumatic.  Mouth/Throat: No oropharyngeal exudate.  Eyes: Conjunctivae are normal. No scleral icterus.   Neck: Normal range of motion. Neck supple. No thyromegaly present.  Cardiovascular: Normal rate and regular rhythm.   Respiratory: Effort normal and breath sounds normal.  GI: Soft. Bowel sounds are normal. She exhibits no distension and no mass. There is tenderness (Mild diffuse tenderness). There is no rebound and no guarding.  Musculoskeletal: Normal range of motion. She exhibits no edema.  Neurological: She is alert and oriented to person, place, and time.  Skin: Skin is warm and dry. No rash noted.  Psychiatric: She has a normal mood and affect. Her behavior is normal. Judgment and thought content normal.    Assessment/Plan: Bloody diarrhea Right-sided colitis noted on CT scan Normal CBC and CMP IBS Chronic constipation Distant history of endometrial cancer Daily NSIADs  Suspected acute infectious colitis. Differential also includes inflammatory bowel disease and ischemic colitis. Obtain ESR and CRP. Colonoscopy recommended.  I consented the patient at the bedside today discussing the risks, benefits, and alternatives to endoscopic evaluation. In particular, we discussed the risks that include but are not limited to, reaction to medication, cardiopulmonary compromise, bleeding requiring blood transfusion, aspiration resulting in pneumonia, perforation requiring surgery, and even death. She acknowledges these risks as asks that we proceed.  Will plan on colonoscopy with Dr. Allen Norris 06/15/17.  Thank you for allowing me to participate in Mrs. Tafolla's care. Please call with any questions or concerns.   Thornton Park 06/14/2017, 7:08 PM

## 2017-06-14 NOTE — H&P (Signed)
Angela Thomas is an 56 y.o. female.   Chief Complaint: Rectal bleeding HPI: The patient with past medical history of IBS and endometrial cancer presents emergency department complaining of bleeding per rectum. The patient states that she's had diarrhea for the last 3 months. It has been painless. However, today she's had 3 stools that were entirely bright red blood. She admits that her left lower abdomen hurt before and during bowel movements, but her last 3 bowel movements have been all loose stool without visible blood. In the emergency department Hemoccult testing was strongly positive. The patient denies fevers, nausea or vomiting. She also denies rash or oral lesions. Due to her ongoing symptoms and possible infectious etiology the emergency department staff called the hospitalist service for admission.  Past Medical History:  Diagnosis Date  . Cancer [facility])    Endometrial  . DDD (degenerative disc disease), lumbar     Past Surgical History:  Procedure Laterality Date  . ABDOMINAL HYSTERECTOMY     total  . BREAST CYST ASPIRATION Bilateral   . BUNIONECTOMY    . BUNIONECTOMY     With hammer toe repair  . CYST REMOVAL NECK    . HAMMER TOE SURGERY    . TONSILLECTOMY AND ADENOIDECTOMY      Family History  Problem Relation Age of Onset  . COPD Mother   . COPD Father   . Diabetes Father   . Pulmonary fibrosis Father   . Bladder Cancer Father   . Breast cancer Maternal Aunt   . Dementia Maternal Aunt   . Transient ischemic attack Maternal Uncle   . Heart disease Maternal Uncle   . Cancer Maternal Uncle        laryngeal  . Heart disease Maternal Grandfather   . Diabetes Paternal Grandfather   . Prostate cancer Paternal Grandfather   . Other Paternal Grandmother        cerebral hemorrhage   Social History:  reports that she has never smoked. She has never used smokeless tobacco. She reports that she drinks about 0.6 oz of alcohol per week . She reports that she does not use  drugs.  Allergies:  Allergies  Allergen Reactions  . Beef-Derived Products Anaphylaxis  . Other Anaphylaxis    Reaction to red meat and pork products  . Pork-Derived Products Anaphylaxis    Medications Prior to Admission  Medication Sig Dispense Refill  . cycloSPORINE (RESTASIS) 0.05 % ophthalmic emulsion Place 1 drop into both eyes every 12 (twelve) hours.    . Diclofenac-Misoprostol (ARTHROTEC) 75-0.2 MG TBEC Take 1 tablet by mouth 2 (two) times daily. 60 tablet 12  . fexofenadine (ALLEGRA) 180 MG tablet Take 1 tablet (180 mg total) by mouth daily. 30 tablet 12  . ALPRAZolam (XANAX) 0.25 MG tablet Take 1 tablet (0.25 mg total) by mouth every 8 (eight) hours as needed for anxiety. (Patient not taking: Reported on 06/14/2017) 30 tablet 5  . valACYclovir (VALTREX) 500 MG tablet Take 1 tablet (500 mg total) by mouth 2 (two) times daily as needed. (Patient not taking: Reported on 06/14/2017) 60 tablet 12    Results for orders placed or performed during the hospital encounter of 06/13/17 (from the past 48 hour(s))  Comprehensive metabolic panel     Status: Abnormal   Collection Time: 06/13/17 10:55 PM  Result Value Ref Range   Sodium 138 135 - 145 mmol/L   Potassium 4.0 3.5 - 5.1 mmol/L    Comment: HEMOLYSIS AT THIS LEVEL MAY AFFECT  RESULT   Chloride 105 101 - 111 mmol/L   CO2 23 22 - 32 mmol/L   Glucose, Bld 148 (H) 65 - 99 mg/dL   BUN 12 6 - 20 mg/dL   Creatinine, Ser 0.68 0.44 - 1.00 mg/dL   Calcium 9.1 8.9 - 10.3 mg/dL   Total Protein 6.7 6.5 - 8.1 g/dL   Albumin 3.7 3.5 - 5.0 g/dL   AST 39 15 - 41 U/L    Comment: HEMOLYSIS AT THIS LEVEL MAY AFFECT RESULT   ALT 31 14 - 54 U/L   Alkaline Phosphatase 78 38 - 126 U/L   Total Bilirubin 0.9 0.3 - 1.2 mg/dL    Comment: HEMOLYSIS AT THIS LEVEL MAY AFFECT RESULT   GFR calc non Af Amer >60 >60 mL/min   GFR calc Af Amer >60 >60 mL/min    Comment: (NOTE) The eGFR has been calculated using the CKD EPI equation. This calculation has not  been validated in all clinical situations. eGFR's persistently <60 mL/min signify possible Chronic Kidney Disease.    Anion gap 10 5 - 15  CBC     Status: None   Collection Time: 06/13/17 10:55 PM  Result Value Ref Range   WBC 9.5 3.6 - 11.0 K/uL   RBC 4.30 3.80 - 5.20 MIL/uL   Hemoglobin 13.8 12.0 - 16.0 g/dL   HCT 39.1 35.0 - 47.0 %   MCV 91.1 80.0 - 100.0 fL   MCH 32.1 26.0 - 34.0 pg   MCHC 35.2 32.0 - 36.0 g/dL   RDW 13.6 11.5 - 14.5 %   Platelets 281 150 - 440 K/uL  Lipase, blood     Status: None   Collection Time: 06/13/17 10:55 PM  Result Value Ref Range   Lipase 29 11 - 51 U/L  Protime-INR     Status: None   Collection Time: 06/13/17 11:28 PM  Result Value Ref Range   Prothrombin Time 12.6 11.4 - 15.2 seconds   INR 0.94   APTT     Status: None   Collection Time: 06/13/17 11:28 PM  Result Value Ref Range   aPTT 27 24 - 36 seconds  Type and screen     Status: None   Collection Time: 06/13/17 11:28 PM  Result Value Ref Range   ABO/RH(D) A POS    Antibody Screen NEG    Sample Expiration 06/16/2017   Gastrointestinal Panel by PCR , Stool     Status: None   Collection Time: 06/14/17  4:10 AM  Result Value Ref Range   Campylobacter species NOT DETECTED NOT DETECTED   Plesimonas shigelloides NOT DETECTED NOT DETECTED   Salmonella species NOT DETECTED NOT DETECTED   Yersinia enterocolitica NOT DETECTED NOT DETECTED   Vibrio species NOT DETECTED NOT DETECTED   Vibrio cholerae NOT DETECTED NOT DETECTED   Enteroaggregative E coli (EAEC) NOT DETECTED NOT DETECTED   Enteropathogenic E coli (EPEC) NOT DETECTED NOT DETECTED   Enterotoxigenic E coli (ETEC) NOT DETECTED NOT DETECTED   Shiga like toxin producing E coli (STEC) NOT DETECTED NOT DETECTED   Shigella/Enteroinvasive E coli (EIEC) NOT DETECTED NOT DETECTED   Cryptosporidium NOT DETECTED NOT DETECTED   Cyclospora cayetanensis NOT DETECTED NOT DETECTED   Entamoeba histolytica NOT DETECTED NOT DETECTED   Giardia  lamblia NOT DETECTED NOT DETECTED   Adenovirus F40/41 NOT DETECTED NOT DETECTED   Astrovirus NOT DETECTED NOT DETECTED   Norovirus GI/GII NOT DETECTED NOT DETECTED   Rotavirus A NOT DETECTED NOT  DETECTED   Sapovirus (I, II, IV, and V) NOT DETECTED NOT DETECTED  C difficile quick scan w PCR reflex     Status: None   Collection Time: 06/14/17  4:10 AM  Result Value Ref Range   C Diff antigen NEGATIVE NEGATIVE   C Diff toxin NEGATIVE NEGATIVE   C Diff interpretation No C. difficile detected.    Ct Abdomen Pelvis W Contrast  Result Date: 06/14/2017 CLINICAL DATA:  Acute onset of rectal bleeding and diarrhea. Lower abdominal cramping. EXAM: CT ABDOMEN AND PELVIS WITH CONTRAST TECHNIQUE: Multidetector CT imaging of the abdomen and pelvis was performed using the standard protocol following bolus administration of intravenous contrast. CONTRAST:  166m ISOVUE-300 IOPAMIDOL (ISOVUE-300) INJECTION 61% COMPARISON:  CT of the abdomen and pelvis from 10/01/2015, and abdominal ultrasound performed 02/12/2016 FINDINGS: Lower chest: The visualized lung bases are grossly clear. The visualized portions of the mediastinum are unremarkable. Hepatobiliary: The liver is unremarkable in appearance. The gallbladder is unremarkable in appearance. The common bile duct remains normal in caliber. Pancreas: The pancreas is within normal limits. Spleen: The spleen is unremarkable in appearance. Adrenals/Urinary Tract: The adrenal glands are unremarkable in appearance. The kidneys are within normal limits. There is no evidence of hydronephrosis. No renal or ureteral stones are identified. Minimal nonspecific perinephric stranding is noted bilaterally. Stomach/Bowel: The stomach is unremarkable in appearance. The small bowel is within normal limits. The appendix is not visualized; there is no evidence for appendicitis. Mild wall thickening is noted along the cecum, ascending colon and transverse colon, concerning for mild acute  infectious or inflammatory colitis. Vascular/Lymphatic: The abdominal aorta is unremarkable in appearance. The inferior vena cava is grossly unremarkable. No retroperitoneal lymphadenopathy is seen. No pelvic sidewall lymphadenopathy is identified. Reproductive: The bladder is moderately distended and grossly unremarkable. The patient is status post hysterectomy. No suspicious adnexal masses are seen. Other: No additional soft tissue abnormalities are seen. Musculoskeletal: No acute osseous abnormalities are identified. Endplate sclerotic change is noted at L5-S1. The visualized musculature is unremarkable in appearance. IMPRESSION: Mild wall thickening along the cecum, ascending colon and transverse colon, concerning for mild infectious or inflammatory colitis. Electronically Signed   By: JGarald BaldingM.D.   On: 06/14/2017 02:23    Review of Systems  Constitutional: Negative for chills and fever.  HENT: Negative for sore throat and tinnitus.   Eyes: Negative for blurred vision and redness.  Respiratory: Negative for cough and shortness of breath.   Cardiovascular: Negative for chest pain, palpitations, orthopnea and PND.  Gastrointestinal: Positive for abdominal pain, blood in stool and diarrhea. Negative for nausea and vomiting.  Genitourinary: Negative for dysuria, frequency and urgency.  Musculoskeletal: Negative for joint pain and myalgias.  Skin: Negative for rash.       No lesions  Neurological: Negative for speech change, focal weakness and weakness.  Endo/Heme/Allergies: Does not bruise/bleed easily.       No temperature intolerance  Psychiatric/Behavioral: Negative for depression and suicidal ideas.    Blood pressure (!) 144/54, pulse 62, temperature 98.4 F (36.9 C), temperature source Oral, resp. rate 20, height 5' 1"  (1.549 m), weight 78.9 kg (174 lb), SpO2 99 %. Physical Exam  Vitals reviewed. Constitutional: She is oriented to person, place, and time. She appears  well-developed and well-nourished. No distress.  HENT:  Head: Normocephalic and atraumatic.  Mouth/Throat: Oropharynx is clear and moist.  Eyes: Pupils are equal, round, and reactive to light. Conjunctivae and EOM are normal. No scleral icterus.  Neck:  Normal range of motion. Neck supple. No JVD present. No tracheal deviation present. No thyromegaly present.  Cardiovascular: Normal rate, regular rhythm and normal heart sounds.  Exam reveals no gallop and no friction rub.   No murmur heard. Respiratory: Effort normal and breath sounds normal.  GI: Soft. Bowel sounds are normal. She exhibits no distension and no mass. There is no tenderness.  Genitourinary:  Genitourinary Comments: Deferred  Musculoskeletal: Normal range of motion. She exhibits no edema.  Lymphadenopathy:    She has no cervical adenopathy.  Neurological: She is alert and oriented to person, place, and time. No cranial nerve deficit. She exhibits normal muscle tone.  Skin: Skin is warm and dry. No rash noted. No erythema.  Psychiatric: She has a normal mood and affect. Her behavior is normal. Judgment and thought content normal.     Assessment/Plan This is a 56 year old female admitted for hematochezia. 1. Hematochezia bright red bleeding per rectum; indicates lower GI bleed. Differential diagnosis includes internal hemorrhoids or AVM. The patient has also been on NSAID therapy which would lead more commonly to ulceration in the upper GI tract but this would not necessarily cause bright red bleeding. Stool is brown Hemoccult positive and grossly nonbloody. I have made the patient nothing by mouth and consult to gastroenterology. Enterovirus as well as infectious diarrheal PCR pending. I have added sulfasalazine to her regimen 2. Abdominal pain: Controlled; may be related to endometrial cancer. 3. Endometrial cancer: Consider GYN oncology consult 4. DVT prophylaxis: SCDs 5. GI prophylaxis: Pantoprazole The patient is a full  code. Time spent on admission orders and patient care possibly 45 minutes  Harrie Foreman, MD 06/14/2017, 6:33 AM

## 2017-06-14 NOTE — Progress Notes (Signed)
  This is a 56 year old female admitted for hematochezia.  The patient has abdominal cramp, dark stool but no bloody stool. She denies any nausea, vomiting or diarrhea. Vital signs are stable. Physical examination is unremarkable except mild abdominal tenderness on LLQ on palpation. Lab revealed. CAT scan of abdomen show mild infectious or inflammatory colitis.  1. Hematochezia bright red bleeding per rectum; Keep nothing by mouth, IVF support, Protonix and follow-up GI consult. Follow-up hemoglobin. Enterovirus as well as infectious diarrheal PCR are unremarkable. C. difficile test is negative. Dr. Marcille Blanco added sulfasalazine to her regimen  2. Colitis: Pain control, start Cipro and Flagyl. 3. Endometrial cancer: Follow-up GYN oncology as outpatient.  Discussed with patient, her daughter and sister.

## 2017-06-14 NOTE — ED Notes (Signed)
MD Karma Greaser performed occult blood.  RN Eliezer Lofts witnessed.

## 2017-06-15 ENCOUNTER — Observation Stay: Payer: Managed Care, Other (non HMO) | Admitting: Anesthesiology

## 2017-06-15 ENCOUNTER — Encounter
Admission: EM | Disposition: A | Discharge status: Home / Self Care | Payer: Self-pay | Source: Home / Self Care | Attending: Emergency Medicine

## 2017-06-15 DIAGNOSIS — K529 Noninfective gastroenteritis and colitis, unspecified: Secondary | ICD-10-CM

## 2017-06-15 DIAGNOSIS — R1030 Lower abdominal pain, unspecified: Secondary | ICD-10-CM

## 2017-06-15 HISTORY — PX: COLONOSCOPY: SHX5424

## 2017-06-15 LAB — C-REACTIVE PROTEIN

## 2017-06-15 LAB — CBC
HEMATOCRIT: 36.2 % (ref 35.0–47.0)
Hemoglobin: 12.2 g/dL (ref 12.0–16.0)
MCH: 30.9 pg (ref 26.0–34.0)
MCHC: 33.7 g/dL (ref 32.0–36.0)
MCV: 91.8 fL (ref 80.0–100.0)
PLATELETS: 295 10*3/uL (ref 150–440)
RBC: 3.94 MIL/uL (ref 3.80–5.20)
RDW: 13.7 % (ref 11.5–14.5)
WBC: 6.2 10*3/uL (ref 3.6–11.0)

## 2017-06-15 LAB — MISC LABCORP TEST (SEND OUT): Labcorp test code: 1453

## 2017-06-15 LAB — SEDIMENTATION RATE: SED RATE: 21 mm/h (ref 0–30)

## 2017-06-15 SURGERY — COLONOSCOPY
Anesthesia: General

## 2017-06-15 MED ORDER — METRONIDAZOLE 500 MG PO TABS
500.0000 mg | ORAL_TABLET | Freq: Three times a day (TID) | ORAL | Status: DC
  Filled 2017-06-15 (×2): qty 1

## 2017-06-15 MED ORDER — LIDOCAINE HCL (PF) 2 % IJ SOLN
INTRAMUSCULAR | Status: AC
  Filled 2017-06-15: qty 2

## 2017-06-15 MED ORDER — PROPOFOL 500 MG/50ML IV EMUL
INTRAVENOUS | Status: DC | PRN
  Administered 2017-06-15: 130 ug/kg/min via INTRAVENOUS

## 2017-06-15 MED ORDER — CIPROFLOXACIN HCL 500 MG PO TABS: 500.0000 mg | ORAL_TABLET | Freq: Two times a day (BID) | ORAL | Status: DC

## 2017-06-15 MED ORDER — METRONIDAZOLE 500 MG PO TABS: 500.0000 mg | ORAL_TABLET | Freq: Three times a day (TID) | ORAL | 0 refills | Status: DC

## 2017-06-15 MED ORDER — CIPROFLOXACIN HCL 500 MG PO TABS: 500.0000 mg | ORAL_TABLET | Freq: Two times a day (BID) | ORAL | 0 refills | Status: DC

## 2017-06-15 MED ORDER — LIDOCAINE HCL (CARDIAC) 20 MG/ML IV SOLN
INTRAVENOUS | Status: DC | PRN
  Administered 2017-06-15: 80 mg via INTRAVENOUS

## 2017-06-15 MED ORDER — PHENYLEPHRINE HCL 10 MG/ML IJ SOLN
INTRAMUSCULAR | Status: DC | PRN
  Administered 2017-06-15: 100 ug via INTRAVENOUS

## 2017-06-15 MED ORDER — PROPOFOL 500 MG/50ML IV EMUL
INTRAVENOUS | Status: AC
  Filled 2017-06-15: qty 50

## 2017-06-15 MED ORDER — PROPOFOL 10 MG/ML IV BOLUS
INTRAVENOUS | Status: DC | PRN
  Administered 2017-06-15: 60 mg via INTRAVENOUS

## 2017-06-15 NOTE — Discharge Summary (Signed)
Angela Thomas at [facility] NAME: Batool Majid    MR#:  [id number]  DATE OF BIRTH:  26-Jan-1961  DATE OF ADMISSION:  06/13/2017   ADMITTING PHYSICIAN: Harrie Foreman, MD  DATE OF DISCHARGE: 06/15/2017 PRIMARY CARE PHYSICIAN: Jerrol Banana., MD   ADMISSION DIAGNOSIS:  Hematochezia [K92.1] Colitis [K52.9] Chronic diarrhea [K52.9] Lower abdominal pain [R10.30] DISCHARGE DIAGNOSIS:  Active Problems:   Hematochezia  SECONDARY DIAGNOSIS:   Past Medical History:  Diagnosis Date  . Cancer [facility])    Endometrial  . DDD (degenerative disc disease), lumbar    HOSPITAL COURSE:   1. Hematochezia bright red bleeding per rectum; She is treated with IVF support, Protonix. Stable hemoglobin. Enterovirus as well as infectious diarrheal PCR are unremarkable. C. difficile test is negative. Colonoscopy today: The colon (entire examined portion) appeared normal. Biopsies for histology were taken with a cold forceps from the right colon and left colon for evaluation of microscopic colitis.  2. Colitis: Pain control, continue Cipro and Flagyl for 5 more day per Dr. Sanjuana Letters. 3. Endometrial cancer: Follow-up GYN oncology as outpatient. Per Dr. Sanjuana Letters, the patient may be discharged to home today. I discussed with Dr. Sanjuana Letters. DISCHARGE CONDITIONS:  Stable, discharge to home today. CONSULTS OBTAINED:  Treatment Team:  Thornton Park, MD Lucilla Lame, MD DRUG ALLERGIES:   Allergies  Allergen Reactions  . Beef-Derived Products Anaphylaxis  . Other Anaphylaxis    Reaction to red meat and pork products  . Pork-Derived Products Anaphylaxis   DISCHARGE MEDICATIONS:   Allergies as of 06/15/2017      Reactions   Beef-derived Products Anaphylaxis   Other Anaphylaxis   Reaction to red meat and pork products   Pork-derived Products Anaphylaxis      Medication List    TAKE these medications   ALPRAZolam 0.25 MG tablet Commonly known as:   XANAX Take 1 tablet (0.25 mg total) by mouth every 8 (eight) hours as needed for anxiety.   ciprofloxacin 500 MG tablet Commonly known as:  CIPRO Take 1 tablet (500 mg total) by mouth 2 (two) times daily.   Diclofenac-Misoprostol 75-0.2 MG Tbec Commonly known as:  ARTHROTEC Take 1 tablet by mouth 2 (two) times daily.   fexofenadine 180 MG tablet Commonly known as:  ALLEGRA Take 1 tablet (180 mg total) by mouth daily.   metroNIDAZOLE 500 MG tablet Commonly known as:  FLAGYL Take 1 tablet (500 mg total) by mouth every 8 (eight) hours.   RESTASIS 0.05 % ophthalmic emulsion Generic drug:  cycloSPORINE Place 1 drop into both eyes every 12 (twelve) hours.   valACYclovir 500 MG tablet Commonly known as:  VALTREX Take 1 tablet (500 mg total) by mouth 2 (two) times daily as needed.        DISCHARGE INSTRUCTIONS:  See AVS.  If you experience worsening of your admission symptoms, develop shortness of breath, life threatening emergency, suicidal or homicidal thoughts you must seek medical attention immediately by calling 911 or calling your MD immediately  if symptoms less severe.  You Must read complete instructions/literature along with all the possible adverse reactions/side effects for all the Medicines you take and that have been prescribed to you. Take any new Medicines after you have completely understood and accpet all the possible adverse reactions/side effects.   Please note  You were cared for by a hospitalist during your hospital stay. If you have any questions about your discharge medications or the care you received  while you were in the hospital after you are discharged, you can call the unit and asked to speak with the hospitalist on call if the hospitalist that took care of you is not available. Once you are discharged, your primary care physician will handle any further medical issues. Please note that NO REFILLS for any discharge medications will be authorized once you  are discharged, as it is imperative that you return to your primary care physician (or establish a relationship with a primary care physician if you do not have one) for your aftercare needs so that they can reassess your need for medications and monitor your lab values.    On the day of Discharge:  VITAL SIGNS:  Blood pressure 110/66, pulse 69, temperature (!) 96.9 F (36.1 C), temperature source Tympanic, resp. rate 16, height 5' 1"  (1.549 m), weight 176 lb (79.8 kg), SpO2 100 %. PHYSICAL EXAMINATION:  GENERAL:  56 y.o.-year-old patient lying in the bed with no acute distress.  EYES: Pupils equal, round, reactive to light and accommodation. No scleral icterus. Extraocular muscles intact.  HEENT: Head atraumatic, normocephalic. Oropharynx and nasopharynx clear.  NECK:  Supple, no jugular venous distention. No thyroid enlargement, no tenderness.  LUNGS: Normal breath sounds bilaterally, no wheezing, rales,rhonchi or crepitation. No use of accessory muscles of respiration.  CARDIOVASCULAR: S1, S2 normal. No murmurs, rubs, or gallops.  ABDOMEN: Soft, non-tender, non-distended. Bowel sounds present. No organomegaly or mass.  EXTREMITIES: No pedal edema, cyanosis, or clubbing.  NEUROLOGIC: Cranial nerves II through XII are intact. Muscle strength 5/5 in all extremities. Sensation intact. Gait not checked.  PSYCHIATRIC: The patient is alert and oriented x 3.  SKIN: No obvious rash, lesion, or ulcer.  DATA REVIEW:   CBC  Recent Labs Lab 06/15/17 0505  WBC 6.2  HGB 12.2  HCT 36.2  PLT 295    Chemistries   Recent Labs Lab 06/13/17 2255  NA 138  K 4.0  CL 105  CO2 23  GLUCOSE 148*  BUN 12  CREATININE 0.68  CALCIUM 9.1  AST 39  ALT 31  ALKPHOS 78  BILITOT 0.9     Microbiology Results  Results for orders placed or performed during the hospital encounter of 06/13/17  Gastrointestinal Panel by PCR , Stool     Status: None   Collection Time: 06/14/17  4:10 AM  Result Value  Ref Range Status   Campylobacter species NOT DETECTED NOT DETECTED Final   Plesimonas shigelloides NOT DETECTED NOT DETECTED Final   Salmonella species NOT DETECTED NOT DETECTED Final   Yersinia enterocolitica NOT DETECTED NOT DETECTED Final   Vibrio species NOT DETECTED NOT DETECTED Final   Vibrio cholerae NOT DETECTED NOT DETECTED Final   Enteroaggregative E coli (EAEC) NOT DETECTED NOT DETECTED Final   Enteropathogenic E coli (EPEC) NOT DETECTED NOT DETECTED Final   Enterotoxigenic E coli (ETEC) NOT DETECTED NOT DETECTED Final   Shiga like toxin producing E coli (STEC) NOT DETECTED NOT DETECTED Final   Shigella/Enteroinvasive E coli (EIEC) NOT DETECTED NOT DETECTED Final   Cryptosporidium NOT DETECTED NOT DETECTED Final   Cyclospora cayetanensis NOT DETECTED NOT DETECTED Final   Entamoeba histolytica NOT DETECTED NOT DETECTED Final   Giardia lamblia NOT DETECTED NOT DETECTED Final   Adenovirus F40/41 NOT DETECTED NOT DETECTED Final   Astrovirus NOT DETECTED NOT DETECTED Final   Norovirus GI/GII NOT DETECTED NOT DETECTED Final   Rotavirus A NOT DETECTED NOT DETECTED Final   Sapovirus (I, II, IV, and  V) NOT DETECTED NOT DETECTED Final  C difficile quick scan w PCR reflex     Status: None   Collection Time: 06/14/17  4:10 AM  Result Value Ref Range Status   C Diff antigen NEGATIVE NEGATIVE Final   C Diff toxin NEGATIVE NEGATIVE Final   C Diff interpretation No C. difficile detected.  Final  MRSA PCR Screening     Status: None   Collection Time: 06/14/17  6:24 AM  Result Value Ref Range Status   MRSA by PCR NEGATIVE NEGATIVE Final    Comment:        The GeneXpert MRSA Assay (FDA approved for NASAL specimens only), is one component of a comprehensive MRSA colonization surveillance program. It is not intended to diagnose MRSA infection nor to guide or monitor treatment for MRSA infections.     RADIOLOGY:  No results found.   Management plans discussed with the patient, 2  daughters and they are in agreement.  CODE STATUS: Full Code   TOTAL TIME TAKING CARE OF THIS PATIENT: 28 minutes.    Demetrios Loll M.D on 06/15/2017 at 1:46 PM  Between 7am to 6pm - Pager - [phone]  After 6pm go to [email or web address] - password [email or web address] [facility]  [facility]  Office  [phone]  CC: Primary care physician; Jerrol Banana., MD   Note: This dictation was prepared with Dragon dictation along with smaller phrase technology. Any transcriptional errors that result from this process are unintentional.

## 2017-06-15 NOTE — Brief Op Note (Signed)
06/13/2017 - 06/15/2017  9:22 AM  PATIENT:  Angela Thomas  56 y.o. female  PRE-OPERATIVE DIAGNOSIS:  Bloody diarrhea, colitis on CT scan  POST-OPERATIVE DIAGNOSIS:  normal colon  PROCEDURE:  Procedure(s): COLONOSCOPY (N/A)  SURGEON:  Surgeon(s) and Role:    * Toshiyuki Fredell, Tally Due, MD - Primary  PHYSICIAN ASSISTANT:   ASSISTANTS: none   ANESTHESIA:   MAC  EBL:  Total I/O In: 200 [I.V.:200] Out: -   BLOOD ADMINISTERED:none  DRAINS: none   LOCAL MEDICATIONS USED:  NONE  SPECIMEN:  Biopsy / Limited Resection  DISPOSITION OF SPECIMEN:  PATHOLOGY  COUNTS:  NO N/A  TOURNIQUET:  * No tourniquets in log *  DICTATION: .Note written in EPIC  PLAN OF CARE: transfer to floor  PATIENT DISPOSITION:  transfer to floor   Delay start of Pharmacological VTE agent (>24hrs) due to surgical blood loss or risk of bleeding: not applicable

## 2017-06-15 NOTE — Discharge Planning (Signed)
Patient IV removed.  RN assessment and VS revealed stability for DC to home.  Discharge papers given, explained and educated.  Scripts given for antibiotics.  Informed of suggested FU appts and appts made.  Patient walked to front and family transporting home via car.

## 2017-06-15 NOTE — Anesthesia Preprocedure Evaluation (Signed)
Anesthesia Evaluation  Patient identified by MRN, date of birth, ID band Patient awake    Reviewed: Allergy & Precautions, NPO status , Patient's Chart, lab work & pertinent test results  History of Anesthesia Complications Negative for: history of anesthetic complications  Airway Mallampati: II       Dental   Pulmonary neg sleep apnea, neg COPD,           Cardiovascular (-) hypertension(-) Past MI and (-) CHF (-) dysrhythmias (-) Cardiac Defibrillator      Neuro/Psych Anxiety negative neurological ROS     GI/Hepatic Neg liver ROS, neg GERD  ,  Endo/Other  neg diabetes  Renal/GU negative Renal ROS     Musculoskeletal   Abdominal   Peds  Hematology   Anesthesia Other Findings   Reproductive/Obstetrics                             Anesthesia Physical Anesthesia Plan  ASA: II  Anesthesia Plan: General   Post-op Pain Management:    Induction: Intravenous  PONV Risk Score and Plan:   Airway Management Planned:   Additional Equipment:   Intra-op Plan:   Post-operative Plan:   Informed Consent: I have reviewed the patients History and Physical, chart, labs and discussed the procedure including the risks, benefits and alternatives for the proposed anesthesia with the patient or authorized representative who has indicated his/her understanding and acceptance.     Plan Discussed with:   Anesthesia Plan Comments:         Anesthesia Quick Evaluation

## 2017-06-15 NOTE — H&P (Signed)
Primary Care Physician:  Jerrol Banana., MD Primary Gastroenterologist:  Dr. Sherri Sear  Pre-Procedure History & Physical: HPI:  Angela Thomas is a 56 y.o. female is here for an colonoscopy.   Past Medical History:  Diagnosis Date  . Cancer [facility])    Endometrial  . DDD (degenerative disc disease), lumbar     Past Surgical History:  Procedure Laterality Date  . ABDOMINAL HYSTERECTOMY     total  . BREAST CYST ASPIRATION Bilateral   . BUNIONECTOMY    . BUNIONECTOMY     With hammer toe repair  . CYST REMOVAL NECK    . HAMMER TOE SURGERY    . TONSILLECTOMY AND ADENOIDECTOMY      Prior to Admission medications   Medication Sig Start Date End Date Taking? Authorizing Provider  cycloSPORINE (RESTASIS) 0.05 % ophthalmic emulsion Place 1 drop into both eyes every 12 (twelve) hours.   Yes [provider]  Diclofenac-Misoprostol (ARTHROTEC) 75-0.2 MG TBEC Take 1 tablet by mouth 2 (two) times daily. 08/06/16  Yes Jerrol Banana., MD  fexofenadine (ALLEGRA) 180 MG tablet Take 1 tablet (180 mg total) by mouth daily. 08/06/16  Yes Jerrol Banana., MD  ALPRAZolam Duanne Moron) 0.25 MG tablet Take 1 tablet (0.25 mg total) by mouth every 8 (eight) hours as needed for anxiety. Patient not taking: Reported on 06/14/2017 08/06/16   Jerrol Banana., MD  valACYclovir (VALTREX) 500 MG tablet Take 1 tablet (500 mg total) by mouth 2 (two) times daily as needed. Patient not taking: Reported on 06/14/2017 04/16/15   Jerrol Banana., MD    Allergies as of 06/13/2017 - Review Complete 06/13/2017  Allergen Reaction Noted  . Beef-derived products Anaphylaxis 04/11/2015  . Other Anaphylaxis 04/12/2015  . Pork-derived products Anaphylaxis 04/11/2015    Family History  Problem Relation Age of Onset  . COPD Mother   . COPD Father   . Diabetes Father   . Pulmonary fibrosis Father   . Bladder Cancer Father   . Breast cancer Maternal Aunt   . Dementia Maternal  Aunt   . Transient ischemic attack Maternal Uncle   . Heart disease Maternal Uncle   . Cancer Maternal Uncle        laryngeal  . Heart disease Maternal Grandfather   . Diabetes Paternal Grandfather   . Prostate cancer Paternal Grandfather   . Other Paternal Grandmother        cerebral hemorrhage    Social History   Social History  . Marital status: Divorced    Spouse name: N/A  . Number of children: N/A  . Years of education: N/A   Occupational History  . Not on file.   Social History Main Topics  . Smoking status: Never Smoker  . Smokeless tobacco: Never Used  . Alcohol use 0.6 oz/week    1 Glasses of wine per week     Comment: 1-2 week  . Drug use: No  . Sexual activity: Not Currently   Other Topics Concern  . Not on file   Social History Narrative  . No narrative on file    Review of Systems: See HPI, otherwise negative ROS  Physical Exam: BP (!) 121/56   Pulse 65   Temp 97.6 F (36.4 C) (Tympanic)   Resp 17   Ht 5\' 1"  (1.549 m)   Wt 79.8 kg (176 lb)   SpO2 99%   BMI 33.25 kg/m  General:  Alert,  pleasant and cooperative in NAD Head:  Normocephalic and atraumatic. Neck:  Supple; no masses or thyromegaly. Lungs:  Clear throughout to auscultation.    Heart:  Regular rate and rhythm. Abdomen:  Soft, nontender and nondistended. Normal bowel sounds, without guarding, and without rebound.   Neurologic:  Alert and  oriented x4;  grossly normal neurologically.  Impression/Plan: [name] is here for an colonoscopy to be performed for Bloody diarrhea  Risks, benefits, limitations, and alternatives regarding  colonoscopy have been reviewed with the patient.  Questions have been answered.  All parties agreeable.   Sherri Sear, MD  06/15/2017, 8:29 AM

## 2017-06-15 NOTE — Transfer of Care (Signed)
Immediate Anesthesia Transfer of Care Note  Patient: Angela Thomas  Procedure(s) Performed: Procedure(s): COLONOSCOPY (N/A)  Patient Location: Endoscopy Unit  Anesthesia Type:General  Level of Consciousness: drowsy and patient cooperative  Airway & Oxygen Therapy: Patient Spontanous Breathing and Patient connected to nasal cannula oxygen  Post-op Assessment: Report given to RN, Post -op Vital signs reviewed and stable and Patient moving all extremities X 4  Post vital signs: Reviewed and stable  Last Vitals:  Vitals:   06/15/17 0805 06/15/17 0910  BP: (!) 121/56 (!) 89/43  Pulse: 65 66  Resp: 17 17  Temp: 36.4 C (!) 36.1 C  SpO2: 99% 99%    Last Pain:  Vitals:   06/15/17 0910  TempSrc: Tympanic  PainSc:          Complications: No apparent anesthesia complications

## 2017-06-15 NOTE — Anesthesia Postprocedure Evaluation (Signed)
Anesthesia Post Note  Patient: Angela Thomas  Procedure(s) Performed: Procedure(s) (LRB): COLONOSCOPY (N/A)  Patient location during evaluation: Endoscopy Anesthesia Type: General Level of consciousness: awake and alert Pain management: pain level controlled Vital Signs Assessment: post-procedure vital signs reviewed and stable Respiratory status: spontaneous breathing and respiratory function stable Cardiovascular status: stable Anesthetic complications: no     Last Vitals:  Vitals:   06/15/17 0910 06/15/17 0920  BP: (!) 89/43 (!) 114/58  Pulse: 66 76  Resp: 17 19  Temp: (!) 36.1 C   SpO2: 99% 99%    Last Pain:  Vitals:   06/15/17 0910  TempSrc: Tympanic  PainSc:                  Deone Omahoney K

## 2017-06-15 NOTE — Op Note (Signed)
[facility] Gastroenterology Patient Name: Angela Thomas Procedure Date: 06/15/2017 8:24 AM MRN: [id number] Account #: [id number] Date of Birth: [birth year] Admit Type: Inpatient Age: 56 Room: [facility] ENDO ROOM 4 Gender: Female Note Status: Finalized Procedure:            Colonoscopy Indications:          Last colonoscopy: April 2011, Clinically significant                        diarrhea of unexplained origin, Rectal bleeding Providers:            Lin Landsman MD, MD Referring MD:         Janine Ores. Rosanna Randy, MD (Referring MD) Medicines:            Monitored Anesthesia Care Complications:        No immediate complications. Estimated blood loss: None. Procedure:            Pre-Anesthesia Assessment:                       - Prior to the procedure, a History and Physical was                        performed, and patient medications and allergies were                        reviewed. The patient is competent. The risks and                        benefits of the procedure and the sedation options and                        risks were discussed with the patient. All questions                        were answered and informed consent was obtained.                        Patient identification and proposed procedure were                        verified by the physician, the nurse, the                        anesthesiologist, the anesthetist and the technician in                        the pre-procedure area in the procedure room. Mental                        Status Examination: alert and oriented. Airway                        Examination: normal oropharyngeal airway and neck                        mobility. Respiratory Examination: clear to                        auscultation. CV Examination: normal. Prophylactic  Antibiotics: The patient does not require prophylactic                        antibiotics. Prior Anticoagulants: The patient has                    taken no previous anticoagulant or antiplatelet agents.                        ASA Grade Assessment: II - A patient with mild systemic                        disease. After reviewing the risks and benefits, the                        patient was deemed in satisfactory condition to undergo                        the procedure. The anesthesia plan was to use monitored                        anesthesia care (MAC). Immediately prior to                        administration of medications, the patient was                        re-assessed for adequacy to receive sedatives. The                        heart rate, respiratory rate, oxygen saturations, blood                        pressure, adequacy of pulmonary ventilation, and                        response to care were monitored throughout the                        procedure. The physical status of the patient was                        re-assessed after the procedure.                       After obtaining informed consent, the colonoscope was                        passed under direct vision. Throughout the procedure,                        the patient's blood pressure, pulse, and oxygen                        saturations were monitored continuously. The                        Colonoscope was introduced through the anus and                        advanced to the the terminal ileum.  The colonoscopy was                        performed without difficulty. The patient tolerated the                        procedure well. The quality of the bowel preparation                        was evaluated using the BBPS [facility] Bowel Preparation                        Scale) with scores of: Right Colon = 2 (minor amount of                        residual staining, small fragments of stool and/or                        opaque liquid, but mucosa seen well), Transverse Colon                        = 2 (minor amount of residual staining, small  fragments                        of stool and/or opaque liquid, but mucosa seen well)                        and Left Colon = 2 (minor amount of residual staining,                        small fragments of stool and/or opaque liquid, but                        mucosa seen well). The total BBPS score equals 6. The                        quality of the bowel preparation was fair. Findings:      The digital rectal exam findings include decreased sphincter tone.       Pertinent negatives include no palpable rectal lesions.      The terminal ileum appeared normal.      The colon (entire examined portion) appeared normal. Biopsies for       histology were taken with a cold forceps from the right colon and left       colon for evaluation of microscopic colitis.      The rectum appeared normal. Impression:           - Preparation of the colon was fair.                       - Decreased sphincter tone found on digital rectal exam.                       - The examined portion of the ileum was normal.                       - The entire examined colon is normal. Biopsied.                       -  The rectum is normal on forward view only.                        Retroflexion performed with inadequate insufflation in                        rectum as pateint was not holding air. Recommendation:       - Return patient to hospital ward for possible                        discharge same day.                       - Resume previous diet today.                       - Continue present medications.                       - Await pathology results.                       - Start anti-diarrheal medications like imodium while                        waiting for pathology results                       - Follow up in clinic if diarrhea persists Procedure Code(s):    --- Professional ---                       [id number], Colonoscopy, flexible; with biopsy, single or                        multiple Diagnosis Code(s):     --- Professional ---                       K62.89, Other specified diseases of anus and rectum                       R19.7, Diarrhea, unspecified                       K62.5, Hemorrhage of anus and rectum CPT copyright 2016 American Medical Association. All rights reserved. The codes documented in this report are preliminary and upon coder review may  be revised to meet current compliance requirements. Dr. Ulyess Mort Lin Landsman MD, MD 06/15/2017 9:18:08 AM This report has been signed electronically. Number of Addenda: 0 Note Initiated On: 06/15/2017 8:24 AM Scope Withdrawal Time: 0 hours 12 minutes 48 seconds  Total Procedure Duration: 0 hours 20 minutes 12 seconds       [facility]

## 2017-06-15 NOTE — Anesthesia Post-op Follow-up Note (Signed)
Anesthesia QCDR form completed.        

## 2017-06-16 ENCOUNTER — Encounter: Payer: Self-pay | Admitting: Gastroenterology

## 2017-06-16 DIAGNOSIS — R1030 Lower abdominal pain, unspecified: Secondary | ICD-10-CM

## 2017-06-16 DIAGNOSIS — K529 Noninfective gastroenteritis and colitis, unspecified: Secondary | ICD-10-CM

## 2017-06-17 LAB — SURGICAL PATHOLOGY

## 2017-06-24 ENCOUNTER — Other Ambulatory Visit: Payer: Self-pay | Admitting: Gastroenterology

## 2017-06-24 ENCOUNTER — Ambulatory Visit (INDEPENDENT_AMBULATORY_CARE_PROVIDER_SITE_OTHER): Payer: Managed Care, Other (non HMO) | Admitting: Family Medicine

## 2017-06-24 VITALS — BP 108/68 | HR 80 | Temp 98.0°F | Resp 16 | Wt 178.0 lb

## 2017-06-24 DIAGNOSIS — M1990 Unspecified osteoarthritis, unspecified site: Secondary | ICD-10-CM | POA: Diagnosis not present

## 2017-06-24 DIAGNOSIS — K529 Noninfective gastroenteritis and colitis, unspecified: Secondary | ICD-10-CM | POA: Diagnosis not present

## 2017-06-24 DIAGNOSIS — K5100 Ulcerative (chronic) pancolitis without complications: Secondary | ICD-10-CM

## 2017-06-24 DIAGNOSIS — T7840XS Allergy, unspecified, sequela: Secondary | ICD-10-CM

## 2017-06-24 MED ORDER — MESALAMINE 1.2 G PO TBEC: 4.8000 g | DELAYED_RELEASE_TABLET | Freq: Two times a day (BID) | ORAL | 3 refills | Status: DC

## 2017-06-24 MED ORDER — MESALAMINE 1.2 G PO TBEC: 4.8000 g | DELAYED_RELEASE_TABLET | Freq: Two times a day (BID) | ORAL | 0 refills | Status: DC

## 2017-06-24 MED ORDER — EPINEPHRINE 0.3 MG/0.3ML IJ SOAJ: 0.3000 mg | Freq: Once | INTRAMUSCULAR | 12 refills | Status: AC

## 2017-06-24 NOTE — Progress Notes (Signed)
Angela Thomas  MRN: [id number] DOB: 04/18/1961  Subjective:  HPI    The patient is a 56 year old female who presents for follow up of hospitaliztaion.  She was admitted on 06/13/17 with rectal bleed and abdominal pain.  She had been having LLQ abdominal pain for 6 weeks and  started having bleeding on 06/13/17 when she presented to the ED.  She had negative stool studies.  Her colonoscopy pathology revealed mild chronic active colitis.   Patient has since started having less watery stools and as of yesterday they were beginning to be somewhat formed stools.  Patient has not had any more abdominal pain.  She does not see GI for follow up.     Patient Active Problem List   Diagnosis Date Noted  . Chronic diarrhea   . Lower abdominal pain   . Hematochezia 06/14/2017  . Other fatigue 06/18/2016  . Vaginal atrophy 06/18/2016  . UTI symptoms 06/18/2016  . DDD (degenerative disc disease), lumbar 04/12/2015  . Vitamin D deficiency 04/12/2015  . Obesity 04/12/2015  . Acute anxiety 04/12/2015  . Allergic rhinitis 04/12/2015  . Fibrocystic breast disease 04/12/2015  . HLD (hyperlipidemia) 02/18/2013  . Arthritis 02/18/2013  . Cancer of endometrium ([place]) 08/02/2012  . Endometrial cancer ([place]) 08/02/2012    Past Medical History:  Diagnosis Date  . Cancer [facility])    Endometrial  . DDD (degenerative disc disease), lumbar     Social History   Social History  . Marital status: Divorced    Spouse name: N/A  . Number of children: N/A  . Years of education: N/A   Occupational History  . Not on file.   Social History Main Topics  . Smoking status: Never Smoker  . Smokeless tobacco: Never Used  . Alcohol use 0.6 oz/week    1 Glasses of wine per week     Comment: 1-2 week  . Drug use: No  . Sexual activity: Not Currently   Other Topics Concern  . Not on file   Social History Narrative  . No narrative on file    Outpatient Encounter Prescriptions as of 06/24/2017    Medication Sig  . ALPRAZolam (XANAX) 0.25 MG tablet Take 1 tablet (0.25 mg total) by mouth every 8 (eight) hours as needed for anxiety.  . cycloSPORINE (RESTASIS) 0.05 % ophthalmic emulsion Place 1 drop into both eyes every 12 (twelve) hours.  . Diclofenac-Misoprostol (ARTHROTEC) 75-0.2 MG TBEC Take 1 tablet by mouth 2 (two) times daily.  . fexofenadine (ALLEGRA) 180 MG tablet Take 1 tablet (180 mg total) by mouth daily.  . valACYclovir (VALTREX) 500 MG tablet Take 1 tablet (500 mg total) by mouth 2 (two) times daily as needed.  . [DISCONTINUED] ciprofloxacin (CIPRO) 500 MG tablet Take 1 tablet (500 mg total) by mouth 2 (two) times daily.  . [DISCONTINUED] metroNIDAZOLE (FLAGYL) 500 MG tablet Take 1 tablet (500 mg total) by mouth every 8 (eight) hours.   No facility-administered encounter medications on file as of 06/24/2017.     Allergies  Allergen Reactions  . Beef-Derived Products Anaphylaxis  . Other Anaphylaxis    Reaction to red meat and pork products  . Pork-Derived Products Anaphylaxis    Review of Systems  Constitutional: Negative for fever and malaise/fatigue.  Respiratory: Negative for cough, shortness of breath and wheezing.   Cardiovascular: Negative for chest pain, palpitations, claudication and leg swelling.  Gastrointestinal: Negative for abdominal pain, blood in stool, constipation, diarrhea, heartburn, melena, nausea and  vomiting.       Symptom free for 5 days now.  Neurological: Negative for weakness.    Objective:  BP 108/68 (BP Location: Right Arm, Patient Position: Sitting, Cuff Size: Normal)   Pulse 80   Temp 98 F (36.7 C) (Oral)   Resp 16   Wt 178 lb (80.7 kg)   BMI 33.63 kg/m   Physical Exam  Constitutional: She is oriented to person, place, and time and well-developed, well-nourished, and in no distress.  HENT:  Head: Normocephalic and atraumatic.  Eyes: Conjunctivae are normal. No scleral icterus.  Neck: No thyromegaly present.  Cardiovascular:  Normal rate, regular rhythm and normal heart sounds.   Pulmonary/Chest: Effort normal and breath sounds normal.  Abdominal: Soft.  Neurological: She is alert and oriented to person, place, and time.  Skin: Skin is warm and dry.  Psychiatric: Mood, memory, affect and judgment normal.    Assessment and Plan :   1. Arthritis Patient is to decrease use of Arthrotec to daily instead of twice daily.    2. Chronic diarrhea   3. Colitis  - Ambulatory referral to Gastroenterology   HPI, Exam and A&P Transcribed under the direction and in the presence of [name]., MD. Electronically Signed: Althea Charon, RMA I have done the exam and reviewed the chart and it is accurate to the best of my knowledge. Development worker, community has been used and  any errors in dictation or transcription are unintentional. Miguel Aschoff M.D. [facility]

## 2017-06-25 ENCOUNTER — Telehealth: Payer: Self-pay

## 2017-06-25 NOTE — Telephone Encounter (Signed)
LVM for pt letting her know that a prescription for mesalamine 2.4gm one tablet twice a day has been sent to pharmacy and we would like for her to make an appt in 1-2 wks for follow up.

## 2017-06-25 NOTE — Telephone Encounter (Signed)
Dr Rosanna Randy this is the message that came to me

## 2017-06-26 ENCOUNTER — Telehealth: Payer: Self-pay | Admitting: Family Medicine

## 2017-06-26 DIAGNOSIS — B379 Candidiasis, unspecified: Secondary | ICD-10-CM

## 2017-06-26 DIAGNOSIS — T3695XA Adverse effect of unspecified systemic antibiotic, initial encounter: Principal | ICD-10-CM

## 2017-06-26 MED ORDER — FLUCONAZOLE 150 MG PO TABS: 150.0000 mg | ORAL_TABLET | Freq: Once | ORAL | 0 refills | Status: AC

## 2017-06-26 NOTE — Telephone Encounter (Signed)
Pt states she was treated with antibiotics while in the hospital and has seen Dr Rosanna Randy this week for a hospital follow up.  Pt states she is having itching in her vaginal area and is requesting a Rx called to help with this.  [facility],  CB#[phone]/MW

## 2017-06-26 NOTE — Telephone Encounter (Signed)
Pt advised-aa 

## 2017-06-26 NOTE — Telephone Encounter (Signed)
Please review-aa 

## 2017-06-26 NOTE — Telephone Encounter (Signed)
Diflucan sent to [facility]

## 2017-07-08 ENCOUNTER — Ambulatory Visit (INDEPENDENT_AMBULATORY_CARE_PROVIDER_SITE_OTHER): Payer: Managed Care, Other (non HMO) | Admitting: Obstetrics and Gynecology

## 2017-07-08 ENCOUNTER — Encounter: Payer: Self-pay | Admitting: Obstetrics and Gynecology

## 2017-07-08 VITALS — BP 113/74 | HR 66 | Ht 61.0 in | Wt 172.4 lb

## 2017-07-08 DIAGNOSIS — Z90722 Acquired absence of ovaries, bilateral: Secondary | ICD-10-CM | POA: Diagnosis not present

## 2017-07-08 DIAGNOSIS — N952 Postmenopausal atrophic vaginitis: Secondary | ICD-10-CM | POA: Diagnosis not present

## 2017-07-08 DIAGNOSIS — Z8542 Personal history of malignant neoplasm of other parts of uterus: Secondary | ICD-10-CM | POA: Diagnosis not present

## 2017-07-08 DIAGNOSIS — Z01419 Encounter for gynecological examination (general) (routine) without abnormal findings: Secondary | ICD-10-CM | POA: Diagnosis not present

## 2017-07-08 DIAGNOSIS — H5210 Myopia, unspecified eye: Secondary | ICD-10-CM | POA: Insufficient documentation

## 2017-07-08 DIAGNOSIS — H524 Presbyopia: Secondary | ICD-10-CM | POA: Insufficient documentation

## 2017-07-08 DIAGNOSIS — Z1211 Encounter for screening for malignant neoplasm of colon: Secondary | ICD-10-CM | POA: Diagnosis not present

## 2017-07-08 DIAGNOSIS — Z9079 Acquired absence of other genital organ(s): Secondary | ICD-10-CM

## 2017-07-08 DIAGNOSIS — Z90710 Acquired absence of both cervix and uterus: Secondary | ICD-10-CM

## 2017-07-08 DIAGNOSIS — H179 Unspecified corneal scar and opacity: Secondary | ICD-10-CM | POA: Insufficient documentation

## 2017-07-08 NOTE — Patient Instructions (Signed)
1. Pap smear is done 2. Mammogram is due in January 2019 3. Stool guaiac cards are deferred due to recent hospitalization for colitis and recent colonoscopy 4. Screening labs are through Dr. Rosanna Randy 5. Continue with healthy eating and exercise 6. Consider using Tums for calcium supplementation. Continue with vitamin D supplementation 7. Return in 6 months for follow-up on history of endometrial cancer; this will be the 5 year Angela Thomas and her last 6 month interval check. 8. Return in 1 year for annual exam   Health Maintenance for Postmenopausal Women Menopause is a normal process in which your reproductive ability comes to an end. This process happens gradually over a span of months to years, usually between the ages of 91 and 49. Menopause is complete when you have missed 12 consecutive menstrual periods. It is important to talk with your health care provider about some of the most common conditions that affect postmenopausal women, such as heart disease, cancer, and bone loss (osteoporosis). Adopting a healthy lifestyle and getting preventive care can help to promote your health and wellness. Those actions can also lower your chances of developing some of these common conditions. What should I know about menopause? During menopause, you may experience a number of symptoms, such as:  Moderate-to-severe hot flashes.  Night sweats.  Decrease in sex drive.  Mood swings.  Headaches.  Tiredness.  Irritability.  Memory problems.  Insomnia.  Choosing to treat or not to treat menopausal changes is an individual decision that you make with your health care provider. What should I know about hormone replacement therapy and supplements? Hormone therapy products are effective for treating symptoms that are associated with menopause, such as hot flashes and night sweats. Hormone replacement carries certain risks, especially as you become older. If you are thinking about using estrogen or  estrogen with progestin treatments, discuss the benefits and risks with your health care provider. What should I know about heart disease and stroke? Heart disease, heart attack, and stroke become more likely as you age. This may be due, in part, to the hormonal changes that your body experiences during menopause. These can affect how your body processes dietary fats, triglycerides, and cholesterol. Heart attack and stroke are both medical emergencies. There are many things that you can do to help prevent heart disease and stroke:  Have your blood pressure checked at least every 1-2 years. High blood pressure causes heart disease and increases the risk of stroke.  If you are 43-64 years old, ask your health care provider if you should take aspirin to prevent a heart attack or a stroke.  Do not use any tobacco products, including cigarettes, chewing tobacco, or electronic cigarettes. If you need help quitting, ask your health care provider.  It is important to eat a healthy diet and maintain a healthy weight. ? Be sure to include plenty of vegetables, fruits, low-fat dairy products, and lean protein. ? Avoid eating foods that are high in solid fats, added sugars, or salt (sodium).  Get regular exercise. This is one of the most important things that you can do for your health. ? Try to exercise for at least 150 minutes each week. The type of exercise that you do should increase your heart rate and make you sweat. This is known as moderate-intensity exercise. ? Try to do strengthening exercises at least twice each week. Do these in addition to the moderate-intensity exercise.  Know your numbers.Ask your health care provider to check your cholesterol and your blood  glucose. Continue to have your blood tested as directed by your health care provider.  What should I know about cancer screening? There are several types of cancer. Take the following steps to reduce your risk and to catch any cancer  development as early as possible. Breast Cancer  Practice breast self-awareness. ? This means understanding how your breasts normally appear and feel. ? It also means doing regular breast self-exams. Let your health care provider know about any changes, no matter how small.  If you are 64 or older, have a clinician do a breast exam (clinical breast exam or CBE) every year. Depending on your age, family history, and medical history, it may be recommended that you also have a yearly breast X-ray (mammogram).  If you have a family history of breast cancer, talk with your health care provider about genetic screening.  If you are at high risk for breast cancer, talk with your health care provider about having an MRI and a mammogram every year.  Breast cancer (BRCA) gene test is recommended for women who have family members with BRCA-related cancers. Results of the assessment will determine the need for genetic counseling and BRCA1 and for BRCA2 testing. BRCA-related cancers include these types: ? Breast. This occurs in males or females. ? Ovarian. ? Tubal. This may also be called fallopian tube cancer. ? Cancer of the abdominal or pelvic lining (peritoneal cancer). ? Prostate. ? Pancreatic.  Cervical, Uterine, and Ovarian Cancer Your health care provider may recommend that you be screened regularly for cancer of the pelvic organs. These include your ovaries, uterus, and vagina. This screening involves a pelvic exam, which includes checking for microscopic changes to the surface of your cervix (Pap test).  For women ages 21-65, health care providers may recommend a pelvic exam and a Pap test every three years. For women ages 11-65, they may recommend the Pap test and pelvic exam, combined with testing for human papilloma virus (HPV), every five years. Some types of HPV increase your risk of cervical cancer. Testing for HPV may also be done on women of any age who have unclear Pap test  results.  Other health care providers may not recommend any screening for nonpregnant women who are considered low risk for pelvic cancer and have no symptoms. Ask your health care provider if a screening pelvic exam is right for you.  If you have had past treatment for cervical cancer or a condition that could lead to cancer, you need Pap tests and screening for cancer for at least 20 years after your treatment. If Pap tests have been discontinued for you, your risk factors (such as having a new sexual partner) need to be reassessed to determine if you should start having screenings again. Some women have medical problems that increase the chance of getting cervical cancer. In these cases, your health care provider may recommend that you have screening and Pap tests more often.  If you have a family history of uterine cancer or ovarian cancer, talk with your health care provider about genetic screening.  If you have vaginal bleeding after reaching menopause, tell your health care provider.  There are currently no reliable tests available to screen for ovarian cancer.  Lung Cancer Lung cancer screening is recommended for adults 24-14 years old who are at high risk for lung cancer because of a history of smoking. A yearly low-dose CT scan of the lungs is recommended if you:  Currently smoke.  Have a history of at  least 30 pack-years of smoking and you currently smoke or have quit within the past 15 years. A pack-year is smoking an average of one pack of cigarettes per day for one year.  Yearly screening should:  Continue until it has been 15 years since you quit.  Stop if you develop a health problem that would prevent you from having lung cancer treatment.  Colorectal Cancer  This type of cancer can be detected and can often be prevented.  Routine colorectal cancer screening usually begins at age 60 and continues through age 68.  If you have risk factors for colon cancer, your health  care provider may recommend that you be screened at an earlier age.  If you have a family history of colorectal cancer, talk with your health care provider about genetic screening.  Your health care provider may also recommend using home test kits to check for hidden blood in your stool.  A small camera at the end of a tube can be used to examine your colon directly (sigmoidoscopy or colonoscopy). This is done to check for the earliest forms of colorectal cancer.  Direct examination of the colon should be repeated every 5-10 years until age 86. However, if early forms of precancerous polyps or small growths are found or if you have a family history or genetic risk for colorectal cancer, you may need to be screened more often.  Skin Cancer  Check your skin from head to toe regularly.  Monitor any moles. Be sure to tell your health care provider: ? About any new moles or changes in moles, especially if there is a change in a mole's shape or color. ? If you have a mole that is larger than the size of a pencil eraser.  If any of your family members has a history of skin cancer, especially at a young age, talk with your health care provider about genetic screening.  Always use sunscreen. Apply sunscreen liberally and repeatedly throughout the day.  Whenever you are outside, protect yourself by wearing long sleeves, pants, a wide-brimmed hat, and sunglasses.  What should I know about osteoporosis? Osteoporosis is a condition in which bone destruction happens more quickly than new bone creation. After menopause, you may be at an increased risk for osteoporosis. To help prevent osteoporosis or the bone fractures that can happen because of osteoporosis, the following is recommended:  If you are 25-28 years old, get at least 1,000 mg of calcium and at least 600 mg of vitamin D per day.  If you are older than age 50 but younger than age 39, get at least 1,200 mg of calcium and at least 600 mg of  vitamin D per day.  If you are older than age 37, get at least 1,200 mg of calcium and at least 800 mg of vitamin D per day.  Smoking and excessive alcohol intake increase the risk of osteoporosis. Eat foods that are rich in calcium and vitamin D, and do weight-bearing exercises several times each week as directed by your health care provider. What should I know about how menopause affects my mental health? Depression may occur at any age, but it is more common as you become older. Common symptoms of depression include:  Low or sad mood.  Changes in sleep patterns.  Changes in appetite or eating patterns.  Feeling an overall lack of motivation or enjoyment of activities that you previously enjoyed.  Frequent crying spells.  Talk with your health care provider if you  think that you are experiencing depression. What should I know about immunizations? It is important that you get and maintain your immunizations. These include:  Tetanus, diphtheria, and pertussis (Tdap) booster vaccine.  Influenza every year before the flu season begins.  Pneumonia vaccine.  Shingles vaccine.  Your health care provider may also recommend other immunizations. This information is not intended to replace advice given to you by your health care provider. Make sure you discuss any questions you have with your health care provider. Document Released: 12/12/2005 Document Revised: 05/09/2016 Document Reviewed: 07/24/2015 Elsevier Interactive Patient Education  2018 [employer].

## 2017-07-08 NOTE — Progress Notes (Signed)
ANNUAL PREVENTATIVE CARE GYN  ENCOUNTER NOTE  Subjective:       Angela Thomas is a 56 y.o. [phone] female here for a routine annual gynecologic exam.  Current complaints: 1.  None  Hospitalization for colitis 2 weeks ago; colonoscopy was completed to confirm diagnosis. Patient is currently doing well with normal bowel function. Bladder function is normal.  History of endometrial cancer, stage I, status post robotically-assisted TL H BSO with lymph node sampling. Endometrial cancer management summary:  Endometrial cancer Management summary: 07/26/2012, hysteroscopy/D&C . Pathology: Endometrioid adenocarcinoma with mucinous differentiation, grade 1 08/2012. Robotically-assisted total laparoscopic hysterectomy, BSO, sentinel lymph node dissection. 05/04/2013. No evidence of disease. 02/22/2014. No evidence of disease             Interval workup by Dr. Rosanna Randy, primary care: 10/01/2015 CT abdomen and pelvis with contrast-no acute findings 02/12/2016 abdominal ultrasound-negative gallstones; probable fatty infiltration of labor; no acute findings 12/07/2016 stool guaiac cards negative 01/01/2017 NED  Gynecologic History No LMP recorded. Patient has had a hysterectomy. Contraception: abstinence Last Pap: 12/2015 neg. Results were: normal Last mammogram: 11/2016 birad 1. Results were: normal  Obstetric History OB History  Gravida Para Term Preterm AB Living  2 2 2     2   SAB TAB Ectopic Multiple Live Births          2    # Outcome Date GA Lbr Len/2nd Weight Sex Delivery Anes PTL Lv  2 Term 1987   8 lb 9.6 oz (3.901 kg) F Vag-Spont   LIV  1 Term 1985   8 lb 2.2 oz (3.692 kg) F Vag-Spont   LIV      Past Medical History:  Diagnosis Date  . Cancer [facility])    Endometrial  . DDD (degenerative disc disease), lumbar     Past Surgical History:  Procedure Laterality Date  . ABDOMINAL HYSTERECTOMY     total  . BREAST CYST ASPIRATION  Bilateral   . BUNIONECTOMY    . BUNIONECTOMY     With hammer toe repair  . COLONOSCOPY N/A 06/15/2017   Procedure: COLONOSCOPY;  Surgeon: Lin Landsman, MD;  Location: [facility] ENDOSCOPY;  Service: Endoscopy;  Laterality: N/A;  . CYST REMOVAL NECK    . HAMMER TOE SURGERY    . TONSILLECTOMY AND ADENOIDECTOMY      Current Outpatient Prescriptions on File Prior to Visit  Medication Sig Dispense Refill  . ALPRAZolam (XANAX) 0.25 MG tablet Take 1 tablet (0.25 mg total) by mouth every 8 (eight) hours as needed for anxiety. 30 tablet 5  . cycloSPORINE (RESTASIS) 0.05 % ophthalmic emulsion Place 1 drop into both eyes every 12 (twelve) hours.    . Diclofenac-Misoprostol (ARTHROTEC) 75-0.2 MG TBEC Take 1 tablet by mouth 2 (two) times daily. 60 tablet 12  . fexofenadine (ALLEGRA) 180 MG tablet Take 1 tablet (180 mg total) by mouth daily. 30 tablet 12  . mesalamine (LIALDA) 1.2 g EC tablet Take 4 tablets (4.8 g total) by mouth 2 (two) times daily. 240 tablet 0  . valACYclovir (VALTREX) 500 MG tablet Take 1 tablet (500 mg total) by mouth 2 (two) times daily as needed. 60 tablet 12   No current facility-administered medications on file prior to visit.     Allergies  Allergen Reactions  . Beef-Derived Products Anaphylaxis  . Other Anaphylaxis    Reaction to red meat and pork products  . Pork-Derived Products Anaphylaxis    Social History   Social History  . Marital  status: Divorced    Spouse name: N/A  . Number of children: N/A  . Years of education: N/A   Occupational History  . Not on file.   Social History Main Topics  . Smoking status: Never Smoker  . Smokeless tobacco: Never Used  . Alcohol use 0.6 oz/week    1 Glasses of wine per week     Comment: 1-2 week  . Drug use: No  . Sexual activity: Not Currently   Other Topics Concern  . Not on file   Social History Narrative  . No narrative on file    Family History  Problem Relation Age of Onset  . COPD Mother   . COPD  Father   . Diabetes Father   . Pulmonary fibrosis Father   . Bladder Cancer Father   . Breast cancer Maternal Aunt   . Dementia Maternal Aunt   . Transient ischemic attack Maternal Uncle   . Heart disease Maternal Uncle   . Cancer Maternal Uncle        laryngeal  . Heart disease Maternal Grandfather   . Diabetes Paternal Grandfather   . Prostate cancer Paternal Grandfather   . Other Paternal Grandmother        cerebral hemorrhage    The following portions of the patient's history were reviewed and updated as appropriate: allergies, current medications, past family history, past medical history, past social history, past surgical history and problem list.  Review of Systems Review of Systems  Constitutional: Negative.   HENT: Negative.   Eyes: Negative.   Respiratory: Negative.   Cardiovascular: Negative.   Gastrointestinal:       Recent hospitalization for colitis, now resolved  Genitourinary: Negative.   Musculoskeletal: Negative.   Skin: Negative.   Neurological: Negative.   Endo/Heme/Allergies: Negative.   Psychiatric/Behavioral: Negative.       Objective:   BP 113/74   Pulse 66   Ht 5' 1"  (1.549 m)   Wt 172 lb 6.4 oz (78.2 kg)   BMI 32.57 kg/m  CONSTITUTIONAL: Well-developed, well-nourished female in no acute distress.  PSYCHIATRIC: Normal mood and affect. Normal behavior. Normal judgment and thought content. [place]: Alert and oriented to person, place, and time. Normal muscle tone coordination. No cranial nerve deficit noted. HENT:  Normocephalic, atraumatic, External right and left ear normal. Oropharynx is clear and moist EYES: Conjunctivae and EOM are normal. Pupils are equal, round, and reactive to light. No scleral icterus.  NECK: Normal range of motion, supple, no masses.  Normal thyroid.  SKIN: Skin is warm and dry. No rash noted. Not diaphoretic. No erythema. No pallor. CARDIOVASCULAR: Normal heart rate noted, regular rhythm, no  murmur. RESPIRATORY: Clear to auscultation bilaterally. Effort and breath sounds normal, no problems with respiration noted. BREASTS: Symmetric in size. No masses, skin changes, nipple drainage, or lymphadenopathy. ABDOMEN: Soft, normal bowel sounds, no distention noted.  No tenderness, rebound or guarding.  BLADDER: Normal PELVIC:  External Genitalia: Normal  BUS: Normal  Vagina: Atrophic changes; good vault support  Cervix: Surgically absent  Uterus: Surgically absent  Adnexa: Normal; nonpalpable nontender  RV: Internal exam deferred due to recent colonoscopy and External Exam NormaI  MUSCULOSKELETAL: Normal range of motion. No tenderness.  No cyanosis, clubbing, or edema.  2+ distal pulses. LYMPHATIC: No Axillary, Supraclavicular, or Inguinal Adenopathy.    Assessment:   Annual gynecologic examination 56 y.o. Contraception: none bmi- 32 Problem List Items Addressed This Visit    Vaginal atrophy    Other Visit  Diagnoses    Well woman exam with routine gynecological exam    -  Primary   Status post TAH-BSO       History of endometrial cancer       Screen for colon cancer         Status post endometrial cancer diagnosis 4.5 years, continues to be NED Plan:  Pap: Pap, Reflex if ASCUS Mammogram: utd Stool Guaiac Testing:  colonoscopy 06/2017 -  Labs: thru pcp Routine preventative health maintenance measures emphasized: Exercise/Diet/Weight control, Tobacco Warnings and Alcohol/Substance use risks Return in 6 months for follow-up on endometrial cancer; if patient continues to be NED, she will commence with annual exams starting with her next annual physical Return to [place], [place]' Brayton Mars, MD  Note: This dictation was prepared with Dragon dictation along with smaller phrase technology. Any transcriptional errors that result from this process are unintentional.

## 2017-07-09 LAB — PAP IG W/ RFLX HPV ASCU: PAP SMEAR COMMENT: 0

## 2017-07-13 ENCOUNTER — Ambulatory Visit (INDEPENDENT_AMBULATORY_CARE_PROVIDER_SITE_OTHER): Payer: Managed Care, Other (non HMO) | Admitting: Gastroenterology

## 2017-07-13 ENCOUNTER — Encounter: Payer: Self-pay | Admitting: Gastroenterology

## 2017-07-13 DIAGNOSIS — K5190 Ulcerative colitis, unspecified, without complications: Secondary | ICD-10-CM | POA: Insufficient documentation

## 2017-07-13 DIAGNOSIS — K5100 Ulcerative (chronic) pancolitis without complications: Secondary | ICD-10-CM | POA: Diagnosis not present

## 2017-07-13 MED ORDER — MESALAMINE 1.2 G PO TBEC: 4.8000 g | DELAYED_RELEASE_TABLET | Freq: Two times a day (BID) | ORAL | 1 refills | Status: DC

## 2017-07-13 NOTE — Progress Notes (Signed)
Cephas Darby, MD [address]  [place]  [place], [place] [address]  Main: [phone]  Fax: [phone]    Gastroenterology Consultation  Referring Provider:     Jerrol Banana.,* Primary Care Physician:  Jerrol Banana., MD Primary Gastroenterologist:  Dr. Cephas Darby Reason for Consultation:     Ulcerative colitis        HPI:   Angela Thomas is a 55 y.o. y/o female referred by Dr. Rosanna Randy, Retia Passe., MD  for consultation & management of Ulcerative colitis. She is here as a hospital follow-up  Initial Hospital encounter 06/14/2017 She has a long standing history of IBS (diagnosed with negative colonoscopy 30 year ago) as well as a lifetime history of constipation requiring daily senna for the last 2-3 years. She had a normal colonoscopy at age 6 with Dr. Vira Agar.  She reports a 3 months history of diarrhea. Symptoms were severe for several days at the end of July following by a short respite. Recurrent approximately 7 days ago with the return of diarrhea. Yesterday it became frankly bloody and she presented to the emergency room. There is associated, nonradiating LLQ abdominal cramping.   There is associated bloating. No other identified exacerbating or relieving features. No extra intestinal manifestations of IBD. Appetite and energy were normal prior to the onset of symptoms.  A CT scan in the ED showed colon thickening involving the cecum, ascending colon and transverse colon. A CMP and CBC is normal.  Her ESR and CRP were normal. GI pathogen panel and C. difficile were negative.  Interval history  I performed a colonoscopy on 06/15/2017, mucosa was entirely normal in the colon and terminal ileum. Random colon biopsies were performed. Histology was consistent with mild chronic active colitis. Given her history of diarrhea, I started her on lialda 4.8 g daily. She is currently having 2 formed bowel movements daily. She denies any abdominal  pain she is happy that her symptoms are under control.  There is no family history of inflammatory bowel disease or autoimmune disease. There is no family history of colon cancer or polyps. 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 She uses [employer] for DJD and sciatica. She also uses [employer] Essential vitamins.    GI Procedures: Colonoscopy 06/15/2017 Preparation of the colon was fair. Normal colonic mucosa. Normal terminal ileum. Random biopsies performed DIAGNOSIS:  A. RIGHT COLON; COLD BIOPSY:  - MILD CHRONIC ACTIVE COLITIS, SEE COMMENT.   B. LEFT COLON; COLD BIOPSY:  - MILD CHRONIC ACTIVE COLITIS, SEE COMMENT. Past Medical History:  Diagnosis Date  . Cancer [facility])    Endometrial  . Colitis   . DDD (degenerative disc disease), lumbar     Past Surgical History:  Procedure Laterality Date  . ABDOMINAL HYSTERECTOMY     total  . BREAST CYST ASPIRATION Bilateral   . BUNIONECTOMY    . BUNIONECTOMY     With hammer toe repair  . COLONOSCOPY N/A 06/15/2017   Procedure: COLONOSCOPY;  Surgeon: Lin Landsman, MD;  Location: [facility] ENDOSCOPY;  Service: Endoscopy;  Laterality: N/A;  . CYST REMOVAL NECK    . HAMMER TOE SURGERY    . TONSILLECTOMY AND ADENOIDECTOMY      Prior to Admission medications   Medication Sig Start Date End Date Taking? Authorizing Provider  ALPRAZolam (XANAX) 0.25 MG tablet Take 1 tablet (0.25 mg total) by mouth every 8 (eight)  hours as needed for anxiety. 08/06/16   Jerrol Banana., MD  cycloSPORINE (RESTASIS) 0.05 % ophthalmic emulsion Place 1 drop into both eyes every 12 (twelve) hours.    [provider]  Diclofenac-Misoprostol (ARTHROTEC) 75-0.2 MG TBEC Take 1 tablet by mouth 2 (two) times daily. 08/06/16   Jerrol Banana., MD  fexofenadine (ALLEGRA) 180 MG tablet Take 1 tablet (180 mg total) by mouth daily. 08/06/16   Jerrol Banana., MD  mesalamine (LIALDA) 1.2 g EC  tablet Take 4 tablets (4.8 g total) by mouth 2 (two) times daily. 06/24/17 07/24/17  Lin Landsman, MD  valACYclovir (VALTREX) 500 MG tablet Take 1 tablet (500 mg total) by mouth 2 (two) times daily as needed. 04/16/15   Jerrol Banana., MD    Family History  Problem Relation Age of Onset  . COPD Mother   . COPD Father   . Diabetes Father   . Pulmonary fibrosis Father   . Bladder Cancer Father   . Breast cancer Maternal Aunt   . Dementia Maternal Aunt   . Transient ischemic attack Maternal Uncle   . Heart disease Maternal Uncle   . Cancer Maternal Uncle        laryngeal  . Heart disease Maternal Grandfather   . Diabetes Paternal Grandfather   . Prostate cancer Paternal Grandfather   . Other Paternal Grandmother        cerebral hemorrhage  . Ovarian cancer Neg Hx      Social History  Substance Use Topics  . Smoking status: Never Smoker  . Smokeless tobacco: Never Used  . Alcohol use 0.6 oz/week    1 Glasses of wine per week     Comment: 1-2 week    Allergies as of 07/13/2017 - Review Complete 07/13/2017  Allergen Reaction Noted  . Beef-derived products Anaphylaxis 04/11/2015  . Other Anaphylaxis 04/12/2015  . Pork-derived products Anaphylaxis 04/11/2015    Review of Systems:    All systems reviewed and negative except where noted in HPI.   Physical Exam:  BP 109/66   Pulse 73   Temp 98 F (36.7 C) (Oral)   Ht 5' 1"  (1.549 m)   Wt 173 lb 12.8 oz (78.8 kg)   BMI 32.84 kg/m  No LMP recorded. Patient has had a hysterectomy.  General:   Alert,  Well-developed, well-nourished, pleasant and cooperative in NAD Head:  Normocephalic and atraumatic. Eyes:  Sclera clear, no icterus.   Conjunctiva pink. Ears:  Normal auditory acuity. Nose:  No deformity, discharge, or lesions. Mouth:  No deformity or lesions,oropharynx pink & moist. Neck:  Supple; no masses or thyromegaly. Lungs:  Respirations even and unlabored.  Clear throughout to auscultation.   No  wheezes, crackles, or rhonchi. No acute distress. Heart:  Regular rate and rhythm; no murmurs, clicks, rubs, or gallops. Abdomen:  Normal bowel sounds.  No bruits.  Soft, non-tender and non-distended without masses, hepatosplenomegaly or hernias noted.  No guarding or rebound tenderness.   Rectal: Nor performed Msk:  Symmetrical without gross deformities. Good, equal movement & strength bilaterally. Pulses:  Normal pulses noted. Extremities:  No clubbing or edema.  No cyanosis. Neurologic:  Alert and oriented x3;  grossly normal neurologically. Skin:  Intact without significant lesions or rashes. No jaundice. Lymph Nodes:  No significant cervical adenopathy. Psych:  Alert and cooperative. Normal mood and affect.  Imaging Studies: Reviewed  Assessment and Plan:   Angela Thomas is a 56 y.o.  y/o female with Mild ulcerative pancolitis in clinical remission on mesalamine 4.8 g daily. I will continue the current dose for at least 6 months then start maintenance dose. Refilled her prescription today. Her creatinine is normal  Follow up in 4-6 months   Cephas Darby, MD

## 2017-07-22 ENCOUNTER — Other Ambulatory Visit: Payer: Self-pay | Admitting: Gastroenterology

## 2017-07-22 DIAGNOSIS — K5100 Ulcerative (chronic) pancolitis without complications: Secondary | ICD-10-CM

## 2017-07-22 LAB — HM HEPATITIS C SCREENING LAB: HM Hepatitis Screen: NEGATIVE

## 2017-07-28 ENCOUNTER — Ambulatory Visit: Payer: Managed Care, Other (non HMO) | Admitting: Gastroenterology

## 2017-07-31 ENCOUNTER — Encounter: Payer: Self-pay | Admitting: Family Medicine

## 2017-08-05 ENCOUNTER — Telehealth: Payer: Self-pay | Admitting: Gastroenterology

## 2017-08-05 NOTE — Telephone Encounter (Signed)
*  STAT* If patient is at the pharmacy, call can be transferred to refill team.   1. Which medications need to be refilled? (please list name of each medication and dose if known)Mesalamine 1.2  2. Which pharmacy/location (including street and city if local pharmacy) is medication to be sent to? [facility]  [place]  3. Do they need a 30 day or 90 day supply? 90 day  Need refills.

## 2017-08-12 ENCOUNTER — Ambulatory Visit (INDEPENDENT_AMBULATORY_CARE_PROVIDER_SITE_OTHER): Payer: Managed Care, Other (non HMO) | Admitting: Family Medicine

## 2017-08-12 VITALS — BP 104/78 | HR 68 | Temp 98.1°F | Resp 16 | Wt 179.0 lb

## 2017-08-12 DIAGNOSIS — Z0000 Encounter for general adult medical examination without abnormal findings: Secondary | ICD-10-CM

## 2017-08-12 NOTE — Progress Notes (Signed)
Patient: Angela Thomas, Female    DOB: 1961-10-30, 56 y.o.   MRN: [id number] Visit Date: 08/12/2017  Today's Provider: [name], MD   Chief Complaint  Patient presents with  . Annual Exam   Subjective:  Angela Thomas is a 56 y.o. female who presents today for health maintenance and complete physical. She feels well. She reports exercising daily. She reports she is sleeping well.  Immunization History  Administered Date(s) Administered  . Influenza-Unspecified 09/03/2015  . Tdap 12/09/2012   06/15/17 Colonoscopy, Dr. Atha Starks sphincter tone otherwise normal 07/08/17 Pap- Normal 11/14/16 Mammogram-Negative  Review of Systems  Constitutional: Negative.   HENT: Negative.   Eyes: Negative.   Respiratory: Negative.   Cardiovascular: Negative.   Gastrointestinal: Negative.   Endocrine: Negative.   Genitourinary: Negative.   Musculoskeletal: Negative.        Feet and ankles hurt with walking.  Skin: Negative.   Allergic/Immunologic: Negative.   Neurological: Negative.   Hematological: Negative.   Psychiatric/Behavioral: Negative.     Social History   Social History  . Marital status: Divorced    Spouse name: N/A  . Number of children: N/A  . Years of education: N/A   Occupational History  . Not on file.   Social History Main Topics  . Smoking status: Never Smoker  . Smokeless tobacco: Never Used  . Alcohol use 0.6 oz/week    1 Glasses of wine per week     Comment: 1-2 week  . Drug use: No  . Sexual activity: Not Currently   Other Topics Concern  . Not on file   Social History Narrative  . No narrative on file    Patient Active Problem List   Diagnosis Date Noted  . Ulcerative colitis ([place]) 07/13/2017  . Corneal scar and opacity 07/08/2017  . Presbyopia 07/08/2017  . Myopia 07/08/2017  . Chronic diarrhea   . Lower abdominal pain   . Hematochezia 06/14/2017  . Other fatigue 06/18/2016  . Vaginal atrophy 06/18/2016  . UTI symptoms  06/18/2016  . DDD (degenerative disc disease), lumbar 04/12/2015  . Vitamin D deficiency 04/12/2015  . Obesity 04/12/2015  . Acute anxiety 04/12/2015  . Allergic rhinitis 04/12/2015  . Fibrocystic breast disease 04/12/2015  . Hyperlipidemia 02/18/2013  . Arthritis 02/18/2013  . Cancer of endometrium ([place]) 08/02/2012  . Endometrial cancer ([place]) 08/02/2012    Past Surgical History:  Procedure Laterality Date  . ABDOMINAL HYSTERECTOMY     total  . BREAST CYST ASPIRATION Bilateral   . BUNIONECTOMY    . BUNIONECTOMY     With hammer toe repair  . COLONOSCOPY N/A 06/15/2017   Procedure: COLONOSCOPY;  Surgeon: Lin Landsman, MD;  Location: [facility] ENDOSCOPY;  Service: Endoscopy;  Laterality: N/A;  . CYST REMOVAL NECK    . HAMMER TOE SURGERY    . TONSILLECTOMY AND ADENOIDECTOMY      Her family history includes Bladder Cancer in her father; Breast cancer in her maternal aunt; COPD in her father and mother; Cancer in her maternal uncle; Dementia in her maternal aunt; Diabetes in her father and paternal grandfather; Heart disease in her maternal grandfather and maternal uncle; Other in her paternal grandmother; Prostate cancer in her paternal grandfather; Pulmonary fibrosis in her father; Transient ischemic attack in her maternal uncle.     Outpatient Encounter Prescriptions as of 08/12/2017  Medication Sig  . ALPRAZolam (XANAX) 0.25 MG tablet Take 1 tablet (0.25 mg total) by mouth every 8 (eight) hours as  needed for anxiety.  . cycloSPORINE (RESTASIS) 0.05 % ophthalmic emulsion Place 1 drop into both eyes every 12 (twelve) hours.  . Diclofenac-Misoprostol (ARTHROTEC) 75-0.2 MG TBEC Take 1 tablet by mouth 2 (two) times daily.  . fexofenadine (ALLEGRA) 180 MG tablet Take 1 tablet (180 mg total) by mouth daily.  . mesalamine (LIALDA) 1.2 g EC tablet Take 4 tablets (4.8 g total) by mouth 2 (two) times daily.  . valACYclovir (VALTREX) 500 MG tablet Take 1 tablet (500 mg total) by mouth 2 (two)  times daily as needed.   No facility-administered encounter medications on file as of 08/12/2017.     Patient Care Team: Jerrol Banana., MD as PCP - General (Family Medicine)      Objective:   Vitals:  Vitals:   08/12/17 0918  BP: 104/78  Pulse: 68  Resp: 16  Temp: 98.1 F (36.7 C)  TempSrc: Oral  Weight: 179 lb (81.2 kg)    Physical Exam  Constitutional: She is oriented to person, place, and time. She appears well-developed and well-nourished.  HENT:  Head: Normocephalic and atraumatic.  Right Ear: External ear normal.  Left Ear: External ear normal.  Nose: Nose normal.  Mouth/Throat: Oropharynx is clear and moist.  Eyes: Pupils are equal, round, and reactive to light. Conjunctivae and EOM are normal.  Neck: Normal range of motion. Neck supple.  Cardiovascular: Normal rate, regular rhythm, normal heart sounds and intact distal pulses.   Pulmonary/Chest: Effort normal and breath sounds normal.  Abdominal: Soft. Bowel sounds are normal.  Musculoskeletal: Normal range of motion.  Neurological: She is alert and oriented to person, place, and time.  Skin: Skin is warm and dry.  Psychiatric: She has a normal mood and affect. Her behavior is normal. Judgment and thought content normal.   Functional Status Survey: Is the patient deaf or have difficulty hearing?: No Does the patient have difficulty seeing, even when wearing glasses/contacts?: No Does the patient have difficulty concentrating, remembering, or making decisions?: No Does the patient have difficulty walking or climbing stairs?: No Does the patient have difficulty dressing or bathing?: No Does the patient have difficulty doing errands alone such as visiting a doctor's office or shopping?: No  Fall Risk  08/12/2017 08/06/2016  Falls in the past year? No No  Current Exercise Habits: Home exercise routine, Frequency (Times/Week): 6    Depression Screen PHQ 2/9 Scores 08/12/2017 08/06/2016  PHQ - 2 Score  0 0  PHQ- 9 Score 0 -   Current Exercise Habits: Home exercise routine, Frequency (Times/Week): 6     Assessment & Plan:     Routine Health Maintenance and Physical Exam  Exercise Activities and Dietary recommendations Goals    None      Immunization History  Administered Date(s) Administered  . Influenza-Unspecified 09/03/2015  . Tdap 12/09/2012    Health Maintenance  Topic Date Due  . HIV Screening  11/23/1973  . MAMMOGRAM  11/14/2018  . PAP SMEAR  07/08/2020  . TETANUS/TDAP  12/09/2022  . COLONOSCOPY  06/16/2027  . INFLUENZA VACCINE  Completed  . Hepatitis C Screening  Completed    Pelvic/Breast/DRE per Gyn. Discussed health benefits of physical activity, and encouraged her to engage in regular exercise appropriate for her age and condition.  Endometrial Cancer Ulcerative Colitis Pt doing well.  I have done the exam and reviewed the chart and it is accurate to the best of my knowledge. Development worker, community has been used and  any errors in  dictation or transcription are unintentional. Miguel Aschoff M.D. [facility]

## 2017-08-13 ENCOUNTER — Encounter: Payer: Self-pay | Admitting: Family Medicine

## 2017-08-15 ENCOUNTER — Other Ambulatory Visit: Payer: Self-pay | Admitting: Gastroenterology

## 2017-08-15 DIAGNOSIS — K5100 Ulcerative (chronic) pancolitis without complications: Secondary | ICD-10-CM

## 2017-08-17 ENCOUNTER — Other Ambulatory Visit: Payer: Self-pay

## 2017-08-17 DIAGNOSIS — K5100 Ulcerative (chronic) pancolitis without complications: Secondary | ICD-10-CM

## 2017-08-17 MED ORDER — MESALAMINE 1.2 G PO TBEC: 4.8000 g | DELAYED_RELEASE_TABLET | Freq: Two times a day (BID) | ORAL | 3 refills | Status: DC

## 2017-08-17 NOTE — Telephone Encounter (Signed)
Patient left a voice message that she needs her Mesalamine refilled. Please send to [facility] and call patient when it's done please [phone]

## 2017-08-18 ENCOUNTER — Encounter: Payer: Self-pay | Admitting: Family Medicine

## 2017-08-19 ENCOUNTER — Other Ambulatory Visit: Payer: Self-pay | Admitting: Family Medicine

## 2017-08-19 NOTE — Telephone Encounter (Signed)
[facility] faxed a refill request on the following medications:  fexofenadine (ALLEGRA) 180 MG tablet.  Take 1 tablet by mouth.  [facility]  This is a Dr Rosanna Randy patient

## 2017-08-20 MED ORDER — FEXOFENADINE HCL 180 MG PO TABS: 180.0000 mg | ORAL_TABLET | Freq: Every day | ORAL | 12 refills | Status: DC

## 2017-08-21 ENCOUNTER — Other Ambulatory Visit: Payer: Self-pay | Admitting: Emergency Medicine

## 2017-08-21 MED ORDER — FEXOFENADINE HCL 180 MG PO TABS: 180.0000 mg | ORAL_TABLET | Freq: Every day | ORAL | 12 refills | Status: DC

## 2017-08-27 ENCOUNTER — Telehealth: Payer: Self-pay | Admitting: Family Medicine

## 2017-08-27 DIAGNOSIS — M1990 Unspecified osteoarthritis, unspecified site: Secondary | ICD-10-CM

## 2017-08-27 NOTE — Telephone Encounter (Signed)
[facility] faxed refill request for following medications:  Diclofenac-Misoprostol (ARTHROTEC) 75-0.2 MG TBEC    Please advise,Thanks [place]

## 2017-08-28 MED ORDER — DICLOFENAC-MISOPROSTOL 75-0.2 MG PO TBEC: 1.0000 | DELAYED_RELEASE_TABLET | Freq: Two times a day (BID) | ORAL | 12 refills | Status: DC

## 2017-09-22 ENCOUNTER — Telehealth: Payer: Self-pay | Admitting: Gastroenterology

## 2017-09-22 NOTE — Telephone Encounter (Signed)
Patient called and her symptoms with severe abdominal pains and diarrhea. Patient has been diagnose ulcerative colitis. She needs advise as to what to do? Please call.

## 2017-09-24 ENCOUNTER — Telehealth: Payer: Self-pay | Admitting: Gastroenterology

## 2017-09-24 NOTE — Telephone Encounter (Signed)
Returned her call. She is feeling better now. Diarrhea lasted only for 1 day, she took light diet and diarrhea, abd pain resolved.  Cephas Darby, MD [address]  [place]  [place], [place] [address]  Main: [phone]  Fax: [phone] Pager: [phone]

## 2017-10-14 ENCOUNTER — Other Ambulatory Visit: Payer: Self-pay

## 2017-10-14 DIAGNOSIS — K5100 Ulcerative (chronic) pancolitis without complications: Secondary | ICD-10-CM

## 2017-10-14 MED ORDER — MESALAMINE 1.2 G PO TBEC: 4.8000 g | DELAYED_RELEASE_TABLET | Freq: Two times a day (BID) | ORAL | 3 refills | Status: DC

## 2017-12-09 LAB — COMPREHENSIVE METABOLIC PANEL
A/G RATIO: 1.6 (ref 1.2–2.2)
ALK PHOS: 98 IU/L (ref 39–117)
ALT: 29 IU/L (ref 0–32)
AST: 41 IU/L — AB (ref 0–40)
Albumin: 4.6 g/dL (ref 3.5–5.5)
BUN/Creatinine Ratio: 21 (ref 9–23)
BUN: 14 mg/dL (ref 6–24)
Bilirubin Total: <0.2 mg/dL (ref 0.0–1.2)
CO2: 17 mmol/L — ABNORMAL LOW (ref 20–29)
Calcium: 10 mg/dL (ref 8.7–10.2)
Chloride: 105 mmol/L (ref 96–106)
Creatinine, Ser: 0.68 mg/dL (ref 0.57–1.00)
GFR, EST AFRICAN AMERICAN: 111 mL/min/{1.73_m2} (ref >59)
GFR, EST NON AFRICAN AMERICAN: 96 mL/min/{1.73_m2} (ref >59)
GLOBULIN, TOTAL: 2.8 g/dL (ref 1.5–4.5)
Glucose: 95 mg/dL (ref 65–99)
Potassium: 5.5 mmol/L — ABNORMAL HIGH (ref 3.5–5.2)
Sodium: 145 mmol/L — ABNORMAL HIGH (ref 134–144)
Total Protein: 7.4 g/dL (ref 6.0–8.5)

## 2017-12-09 LAB — LIPID PANEL WITH LDL/HDL RATIO
CHOLESTEROL TOTAL: 266 mg/dL — AB (ref 100–199)
HDL: 59 mg/dL (ref >39)
LDL CALC: 178 mg/dL — AB (ref 0–99)
LDL/HDL RATIO: 3 ratio (ref 0.0–3.2)
TRIGLYCERIDES: 143 mg/dL (ref 0–149)
VLDL Cholesterol Cal: 29 mg/dL (ref 5–40)

## 2017-12-11 ENCOUNTER — Telehealth: Payer: Self-pay | Admitting: Family Medicine

## 2017-12-11 NOTE — Telephone Encounter (Signed)
Pt is requesting Jiles Garter return her call. Pt is requesting lab results from 12/08/17. Please advise. Thanks TNP

## 2017-12-11 NOTE — Telephone Encounter (Signed)
Advised  ED 

## 2017-12-14 ENCOUNTER — Other Ambulatory Visit: Payer: Self-pay

## 2017-12-14 ENCOUNTER — Ambulatory Visit: Payer: Managed Care, Other (non HMO) | Admitting: Gastroenterology

## 2017-12-14 ENCOUNTER — Encounter: Payer: Self-pay | Admitting: Gastroenterology

## 2017-12-14 VITALS — BP 114/57 | HR 74 | Temp 97.9°F | Wt 180.0 lb

## 2017-12-14 DIAGNOSIS — K5100 Ulcerative (chronic) pancolitis without complications: Secondary | ICD-10-CM

## 2017-12-14 DIAGNOSIS — E875 Hyperkalemia: Secondary | ICD-10-CM

## 2017-12-14 MED ORDER — MESALAMINE 1.2 G PO TBEC: 4.8000 g | DELAYED_RELEASE_TABLET | Freq: Two times a day (BID) | ORAL | 5 refills | Status: DC

## 2017-12-14 NOTE — Progress Notes (Signed)
Cephas Darby, MD [address]  [place]  [place], [place] [address]  Main: [phone]  Fax: [phone]    Gastroenterology Consultation  Referring Provider:     Jerrol Banana.,* Primary Care Physician:  Jerrol Banana., MD Primary Gastroenterologist:  Dr. Cephas Darby Reason for Consultation:     Ulcerative colitis        HPI:   Angela Thomas is a 57 y.o. y/o female referred by Dr. Rosanna Randy, Retia Passe., MD  for consultation & management of Ulcerative colitis.  Initial Hospital encounter 06/14/2017 She has a long standing history of IBS (diagnosed with negative colonoscopy 30 year ago) as well as a lifetime history of constipation requiring daily senna for the last 2-3 years. She had a normal colonoscopy at age 32 with Dr. Vira Agar.  She reports a 3 months history of diarrhea. Symptoms were severe for several days at the end of July following by a short respite. Recurrent approximately 7 days ago with the return of diarrhea. Yesterday it became frankly bloody and she presented to the emergency room. There is associated, nonradiating LLQ abdominal cramping.   There is associated bloating. No other identified exacerbating or relieving features. No extra intestinal manifestations of IBD. Appetite and energy were normal prior to the onset of symptoms.  A CT scan in the ED showed colon thickening involving the cecum, ascending colon and transverse colon. A CMP and CBC is normal.  Her ESR and CRP were normal. GI pathogen panel and C. difficile were negative.  Interval history  I performed a colonoscopy on 06/15/2017, mucosa was entirely normal in the colon and terminal ileum. Random colon biopsies were performed. Histology was consistent with mild chronic active colitis. Given her history of diarrhea, I started her on lialda 4.8 g daily. She is currently having 2 formed bowel movements daily. She denies any abdominal pain she is happy that her symptoms  are under control.  Follow-up visit 12/14/2017 Patient is doing very well on mesalamine 4.8 g daily. She is currently having one to 2 formed bowel movements daily without any other GI symptoms. She does report burning per rectum upon wiping. Her recent labs showed mildly elevated potassium and sodium. She thought these are secondary to hemolysis.  There is no family history of inflammatory bowel disease or autoimmune disease. There is no family history of colon cancer or polyps. 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 She uses [employer] for DJD and sciatica. She also uses [employer]ential vitamins.    GI Procedures: Colonoscopy 06/15/2017 Preparation of the colon was fair. Normal colonic mucosa. Normal terminal ileum. Random biopsies performed DIAGNOSIS:  A. RIGHT COLON; COLD BIOPSY:  - MILD CHRONIC ACTIVE COLITIS, SEE COMMENT.   B. LEFT COLON; COLD BIOPSY:  - MILD CHRONIC ACTIVE COLITIS, SEE COMMENT. Past Medical History:  Diagnosis Date  . Cancer [facility])    Endometrial  . Colitis   . DDD (degenerative disc disease), lumbar     Past Surgical History:  Procedure Laterality Date  . ABDOMINAL HYSTERECTOMY     total  . BREAST CYST ASPIRATION Bilateral   . BUNIONECTOMY    . BUNIONECTOMY     With hammer toe repair  . COLONOSCOPY N/A 06/15/2017   Procedure: COLONOSCOPY;  Surgeon: Lin Landsman, MD;  Location: [facility] ENDOSCOPY;  Service: Endoscopy;  Laterality: N/A;  . CYST REMOVAL NECK    .  HAMMER TOE SURGERY    . TONSILLECTOMY AND ADENOIDECTOMY       Current Outpatient Medications:  .  ALPRAZolam (XANAX) 0.25 MG tablet, Take 1 tablet (0.25 mg total) by mouth every 8 (eight) hours as needed for anxiety., Disp: 30 tablet, Rfl: 5 .  cycloSPORINE (RESTASIS) 0.05 % ophthalmic emulsion, Place 1 drop into both eyes every 12 (twelve) hours., Disp: , Rfl:  .  Diclofenac-Misoprostol (ARTHROTEC) 75-0.2 MG TBEC, Take 1 tablet by  mouth 2 (two) times daily., Disp: 60 tablet, Rfl: 12 .  DOCOSAHEXAENOIC ACID PO, Take by mouth., Disp: , Rfl:  .  fexofenadine (ALLEGRA) 180 MG tablet, Take 1 tablet (180 mg total) by mouth daily., Disp: 30 tablet, Rfl: 12 .  Flaxseed, Linseed, (FLAX SEED OIL) 1000 MG CAPS, Take by mouth., Disp: , Rfl:  .  mesalamine (LIALDA) 1.2 g EC tablet, Take 4 tablets (4.8 g total) by mouth 2 (two) times daily., Disp: 120 tablet, Rfl: 3 .  valACYclovir (VALTREX) 500 MG tablet, Take 1 tablet (500 mg total) by mouth 2 (two) times daily as needed., Disp: 60 tablet, Rfl: 12   Family History  Problem Relation Age of Onset  . COPD Mother   . COPD Father   . Diabetes Father   . Pulmonary fibrosis Father   . Bladder Cancer Father   . Breast cancer Maternal Aunt   . Dementia Maternal Aunt   . Transient ischemic attack Maternal Uncle   . Heart disease Maternal Uncle   . Cancer Maternal Uncle        laryngeal  . Heart disease Maternal Grandfather   . Diabetes Paternal Grandfather   . Prostate cancer Paternal Grandfather   . Other Paternal Grandmother        cerebral hemorrhage  . Ovarian cancer Neg Hx      Social History   Tobacco Use  . Smoking status: Never Smoker  . Smokeless tobacco: Never Used  Substance Use Topics  . Alcohol use: Yes    Alcohol/week: 0.6 oz    Types: 1 Glasses of wine per week    Comment: 1-2 week  . Drug use: No    Allergies as of 12/14/2017 - Review Complete 12/14/2017  Allergen Reaction Noted  . Beef-derived products Anaphylaxis 04/11/2015  . Other Anaphylaxis 04/12/2015  . Pork-derived products Anaphylaxis 04/11/2015    Review of Systems:    All systems reviewed and negative except where noted in HPI.   Physical Exam:  BP (!) 114/57   Pulse 74   Temp 97.9 F (36.6 C) (Oral)   Wt 180 lb (81.6 kg)   BMI 34.01 kg/m  No LMP recorded. Patient has had a hysterectomy.  General:   Alert,  Well-developed, well-nourished, pleasant and cooperative in NAD Head:   Normocephalic and atraumatic. Eyes:  Sclera clear, no icterus.   Conjunctiva pink. Ears:  Normal auditory acuity. Nose:  No deformity, discharge, or lesions. Mouth:  No deformity or lesions,oropharynx pink & moist. Neck:  Supple; no masses or thyromegaly. Lungs:  Respirations even and unlabored.  Clear throughout to auscultation.   No wheezes, crackles, or rhonchi. No acute distress. Heart:  Regular rate and rhythm; no murmurs, clicks, rubs, or gallops. Abdomen:  Normal bowel sounds.  No bruits.  Soft, non-tender and non-distended without masses, hepatosplenomegaly or hernias noted.  No guarding or rebound tenderness.   Rectal: Nor performed Msk:  Symmetrical without gross deformities. Good, equal movement & strength bilaterally. Pulses:  Normal pulses noted. Extremities:  No clubbing or edema.  No cyanosis. Neurologic:  Alert and oriented x3;  grossly normal neurologically. Skin:  Intact without significant lesions or rashes. No jaundice. Lymph Nodes:  No significant cervical adenopathy. Psych:  Alert and cooperative. Normal mood and affect.  Imaging Studies: Reviewed  Assessment and Plan:   Angela Thomas is a 57 y.o. Caucasian female with Mild ulcerative pancolitis in clinical remission on mesalamine 4.8 g daily since 07/2017. I will continue the current dose for 6 months then taper down to 3 pills daily for 1 to 2 months then 2 pills daily which is the maintenance dose. Refilled her prescription today.   - check BMP, CBC  Follow up in 6 months   Cephas Darby, MD

## 2017-12-15 ENCOUNTER — Other Ambulatory Visit: Payer: Self-pay | Admitting: Family Medicine

## 2017-12-15 DIAGNOSIS — Z1231 Encounter for screening mammogram for malignant neoplasm of breast: Secondary | ICD-10-CM

## 2017-12-16 ENCOUNTER — Other Ambulatory Visit: Payer: Self-pay | Admitting: Emergency Medicine

## 2017-12-16 ENCOUNTER — Ambulatory Visit
Admission: RE | Admit: 2017-12-16 | Discharge: 2017-12-16 | Disposition: A | Discharge status: Home / Self Care | Payer: Managed Care, Other (non HMO) | Source: Ambulatory Visit | Attending: Family Medicine | Admitting: Family Medicine

## 2017-12-16 DIAGNOSIS — K5100 Ulcerative (chronic) pancolitis without complications: Secondary | ICD-10-CM

## 2017-12-16 DIAGNOSIS — Z1231 Encounter for screening mammogram for malignant neoplasm of breast: Secondary | ICD-10-CM | POA: Insufficient documentation

## 2018-01-06 ENCOUNTER — Encounter: Payer: Managed Care, Other (non HMO) | Admitting: Obstetrics and Gynecology

## 2018-01-07 ENCOUNTER — Encounter: Payer: Managed Care, Other (non HMO) | Admitting: Obstetrics and Gynecology

## 2018-01-13 ENCOUNTER — Encounter: Payer: Self-pay | Admitting: Obstetrics and Gynecology

## 2018-01-13 ENCOUNTER — Ambulatory Visit (INDEPENDENT_AMBULATORY_CARE_PROVIDER_SITE_OTHER): Payer: Managed Care, Other (non HMO) | Admitting: Obstetrics and Gynecology

## 2018-01-13 VITALS — BP 109/72 | HR 73 | Ht 61.0 in | Wt 178.8 lb

## 2018-01-13 DIAGNOSIS — C541 Malignant neoplasm of endometrium: Secondary | ICD-10-CM | POA: Diagnosis not present

## 2018-01-13 DIAGNOSIS — K5100 Ulcerative (chronic) pancolitis without complications: Secondary | ICD-10-CM

## 2018-01-13 NOTE — Progress Notes (Signed)
Chief complaint: 1.  Endometrial cancer follow-up 2.  History of colitis, resolved  Angela Thomas presents for 5-monthinterval follow-up.  She is doing well with normal bowel and bladder function at this time.  She is on chronic medication for colitis which is currently stable. No significant abdominal pelvic pain.  No change in weight.    History of endometrial cancer, stage I, status post robotically-assisted TL H BSO with lymph node sampling. Endometrial cancer management summary:  Endometrial cancer Management summary: 07/26/2012, hysteroscopy/D&C . Pathology: Endometrioid adenocarcinoma with mucinous differentiation, grade 1 08/2012. Robotically-assisted total laparoscopic hysterectomy, BSO, sentinel lymph node dissection. 05/04/2013. No evidence of disease. 02/22/2014. No evidence of disease Interval workup by Dr. GRosanna Randy primary care: 10/01/2015 CT abdomen and pelvis with contrast-no acute findings 02/12/2016 abdominal ultrasound-negative gallstones; probable fatty infiltration of labor; no acute findings 12/07/2016 stool guaiac cards negative 01/01/2017 NED 07/08/2017 NED; hospitalization for colitis 01/13/2018 NED; begin annual gynecologic follow-up  Past medical history, past surgical history, problem list, medications, and allergies are reviewed  Review of Systems  Constitutional:       No vasomotor symptoms; no night sweats; no fever chills or sweats. No weight loss  HENT: Negative.   Eyes: Negative.   Respiratory: Negative.   Cardiovascular: Negative.   Gastrointestinal: Negative.        Colitis symptoms stable on medication  Genitourinary: Negative.   Skin: Negative.   Neurological: Negative.   Endo/Heme/Allergies: Negative.   Psychiatric/Behavioral: Negative.      OBJECTIVE: BP 109/72   Pulse 73   Ht 5' 1"  (1.549 m)   Wt 178 lb 12.8 oz (81.1 kg)   BMI 33.78 kg/m  Pleasant well-appearing female in no  acute distress.  Alert and oriented. HEENT exam: Normal cephalic atraumatic; no thyromegaly or adenopathy Lungs: Clear Heart: Regular rate and rhythm without murmur Abdomen: Soft, nontender; no abdominal pelvic masses. Pelvic exam: External genitalia-normal BUS-normal Vagina-mild atrophic changes present; good vaginal vault support; no palpable masses on bimanual exam Cervix-surgically absent Uterus-surgically absent Adnexa-surgically absent Rectovaginal-mild perianal erythema; normal sphincter tone; no rectal masses Extremities: Warm, dry, no edema Lymphatic: No inguinal adenopathy; no subclavicular adenopathy  ASSESSMENT: 1.  History of endometrial cancer, status post TAH/BSO, NED 2.  History of colitis, stable, on medication  PLAN: 1.  Continue with healthy eating and exercise 2.  Monitor for any abdominal pelvic pain or gynecologic symptoms 3.  Return in 1 year for the start of annual exams.  A total of 15 minutes were spent face-to-face with the patient during this encounter and over half of that time dealt with counseling and coordination of care.  MBrayton Mars MD  Note: This dictation was prepared with Dragon dictation along with smaller phrase technology. Any transcriptional errors that result from this process are unintentional.

## 2018-01-13 NOTE — Patient Instructions (Signed)
1.  Exam is normal today. 2.  Decrease follow-up to yearly exams. 3.  Return in 1 year for annual exam or sooner if any gynecologic symptoms arise.

## 2018-01-21 ENCOUNTER — Telehealth: Payer: Self-pay | Admitting: Gastroenterology

## 2018-01-21 NOTE — Telephone Encounter (Signed)
Returned pt's call. Lower abdominal cramps a/w 4-5 episodes of non-bloody diarrhea yesterday evening. She is tolerating diet well today, symptoms did not recur today. Recommend stool studies to r/o infections if symptoms recur Continue liada 4.8gm daily If she has recurrent flare ups, will assess for progression of UC, she will maintain a diary  Pt expressed understanding of the plan  Cephas Darby, MD [place]  [place], [place] [address]  Main: [phone]  Fax: [phone] Pager: [phone]

## 2018-01-21 NOTE — Telephone Encounter (Signed)
2 last days severe stomach pain  Resulted in a lot of  bowelmovemts  She thinks it may be a flare from ulceris colotis.pt would like a call back cb [phone]

## 2018-01-21 NOTE — Telephone Encounter (Signed)
I've asked Romero Liner to schedule patient an appt to come in to discuss.

## 2018-01-21 NOTE — Telephone Encounter (Signed)
Thanks she has been canceled.

## 2018-01-22 ENCOUNTER — Ambulatory Visit: Payer: Managed Care, Other (non HMO) | Admitting: Gastroenterology

## 2018-02-26 ENCOUNTER — Ambulatory Visit: Payer: Managed Care, Other (non HMO) | Admitting: Family Medicine

## 2018-02-26 ENCOUNTER — Encounter: Payer: Self-pay | Admitting: Family Medicine

## 2018-02-26 VITALS — BP 120/78 | HR 90 | Temp 98.2°F | Resp 16 | Wt 175.0 lb

## 2018-02-26 DIAGNOSIS — E875 Hyperkalemia: Secondary | ICD-10-CM

## 2018-02-26 DIAGNOSIS — J0140 Acute pansinusitis, unspecified: Secondary | ICD-10-CM | POA: Diagnosis not present

## 2018-02-26 MED ORDER — AMOXICILLIN-POT CLAVULANATE 875-125 MG PO TABS: 1.0000 | ORAL_TABLET | Freq: Two times a day (BID) | ORAL | 0 refills | Status: AC

## 2018-02-26 MED ORDER — FLUTICASONE PROPIONATE 50 MCG/ACT NA SUSP: 2.0000 | Freq: Every day | NASAL | 6 refills | Status: DC

## 2018-02-26 NOTE — Progress Notes (Signed)
Patient: Angela Thomas Female    DOB: 10-15-1961   57 y.o.   MRN: [id number] Visit Date: 02/26/2018  Today's Provider: Lavon Paganini, MD   I, Martha Clan, CMA, am acting as scribe for Lavon Paganini, MD.  Chief Complaint  Patient presents with  . URI   Subjective:    URI   This is a new problem. Episode onset: x about 1 week. Progression since onset: pt was improving, but suddenly felt worse 2 days ago. Maximum temperature: has felt feverish, but no documented temp. Associated symptoms include congestion, coughing (is productive mostly at night), ear pain, headaches, neck pain, a plugged ear sensation, rhinorrhea, sinus pain, a sore throat (improved) and swollen glands (right gland swollen earlier in the week). Pertinent negatives include no abdominal pain, chest pain, nausea, sneezing, vomiting or wheezing. Treatments tried: Mucinex. The treatment provided mild relief.   Alos, patient was noted to have K 5.5 in 12/2017.  The sample was hemolyzed.  She was advised to have this rechecked but has not yet.    Allergies  Allergen Reactions  . Beef-Derived Products Anaphylaxis  . Other Anaphylaxis    Reaction to red meat and pork products  . Pork-Derived Products Anaphylaxis     Current Outpatient Medications:  .  ALPRAZolam (XANAX) 0.25 MG tablet, Take 1 tablet (0.25 mg total) by mouth every 8 (eight) hours as needed for anxiety., Disp: 30 tablet, Rfl: 5 .  cycloSPORINE (RESTASIS) 0.05 % ophthalmic emulsion, Place 1 drop into both eyes every 12 (twelve) hours., Disp: , Rfl:  .  Diclofenac-Misoprostol (ARTHROTEC) 75-0.2 MG TBEC, Take 1 tablet by mouth 2 (two) times daily., Disp: 60 tablet, Rfl: 12 .  fexofenadine (ALLEGRA) 180 MG tablet, Take 1 tablet (180 mg total) by mouth daily., Disp: 30 tablet, Rfl: 12 .  Flaxseed, Linseed, (FLAX SEED OIL) 1000 MG CAPS, Take by mouth., Disp: , Rfl:  .  mesalamine (LIALDA) 1.2 g EC tablet, Take 4 tablets (4.8 g total) by mouth 2  (two) times daily., Disp: 240 tablet, Rfl: 5 .  valACYclovir (VALTREX) 500 MG tablet, Take 1 tablet (500 mg total) by mouth 2 (two) times daily as needed., Disp: 60 tablet, Rfl: 12  Review of Systems  HENT: Positive for congestion, ear pain, rhinorrhea, sinus pain and sore throat (improved). Negative for sneezing.   Respiratory: Positive for cough (is productive mostly at night). Negative for wheezing.   Cardiovascular: Negative for chest pain.  Gastrointestinal: Negative for abdominal pain, nausea and vomiting.  Musculoskeletal: Positive for neck pain.  Neurological: Positive for headaches.    Social History   Tobacco Use  . Smoking status: Never Smoker  . Smokeless tobacco: Never Used  Substance Use Topics  . Alcohol use: Yes    Alcohol/week: 0.6 oz    Types: 1 Glasses of wine per week    Comment: 1-2 week   Objective:   BP 120/78 (BP Location: Left Arm, Patient Position: Sitting, Cuff Size: Large)   Pulse 90   Temp 98.2 F (36.8 C) (Oral)   Resp 16   Wt 195 lb (88.5 kg)   SpO2 97%   BMI 36.84 kg/m  Vitals:   02/26/18 1317  BP: 120/78  Pulse: 90  Resp: 16  Temp: 98.2 F (36.8 C)  TempSrc: Oral  SpO2: 97%  Weight: 195 lb (88.5 kg)     Physical Exam  Constitutional: She is oriented to person, place, and time. She appears well-developed and  well-nourished. No distress.  HENT:  Head: Normocephalic and atraumatic.  Right Ear: Tympanic membrane, external ear and ear canal normal.  Left Ear: Tympanic membrane, external ear and ear canal normal.  Nose: Mucosal edema present. Right sinus exhibits maxillary sinus tenderness and frontal sinus tenderness. Left sinus exhibits maxillary sinus tenderness and frontal sinus tenderness.  Mouth/Throat: Uvula is midline and mucous membranes are normal. Posterior oropharyngeal erythema present. No oropharyngeal exudate or posterior oropharyngeal edema.  Eyes: Pupils are equal, round, and reactive to light. Conjunctivae are normal.  Right eye exhibits no discharge. Left eye exhibits no discharge. No scleral icterus.  Neck: Neck supple. No thyromegaly present.  Cardiovascular: Normal rate, regular rhythm, normal heart sounds and intact distal pulses.  No murmur heard. Pulmonary/Chest: Effort normal and breath sounds normal. No respiratory distress. She has no wheezes. She has no rales.  Musculoskeletal: She exhibits no edema.  Lymphadenopathy:    She has no cervical adenopathy.  Neurological: She is alert and oriented to person, place, and time.  Skin: Skin is warm and dry. Capillary refill takes less than 2 seconds. No rash noted.  Psychiatric: She has a normal mood and affect. Her behavior is normal.  Vitals reviewed.     Assessment & Plan:   1. Acute non-recurrent pansinusitis -Symptoms and exam consistent with bacterial sinusitis -This is likely secondary to initial viral URI that was improving before it suddenly worsened - Advised patient on symptomatic management by continuing Mucinex and trial Flonase -Treat with Augmentin x7 days -Natural course and return precautions discussed  2. Hyperkalemia - Noted to have K of 5.5 in 12/2017 but the sample was thought to be hemolyzed -We will recheck a BMP today to ensure no continued hyperkalemia - Basic Metabolic Panel (BMET)    Meds ordered this encounter  Medications  . amoxicillin-clavulanate (AUGMENTIN) 875-125 MG tablet    Sig: Take 1 tablet by mouth 2 (two) times daily for 7 days.    Dispense:  14 tablet    Refill:  0  . fluticasone (FLONASE) 50 MCG/ACT nasal spray    Sig: Place 2 sprays into both nostrils daily.    Dispense:  16 g    Refill:  6     Return if symptoms worsen or fail to improve.   The entirety of the information documented in the History of Present Illness, Review of Systems and Physical Exam were personally obtained by me. Portions of this information were initially documented by Raquel Sarna Ratchford, CMA and reviewed by me for  thoroughness and accuracy.    Virginia Crews, MD, MPH [facility] 02/26/2018 1:47 PM

## 2018-02-26 NOTE — Patient Instructions (Signed)

## 2018-02-27 LAB — BASIC METABOLIC PANEL
BUN/Creatinine Ratio: 24 — ABNORMAL HIGH (ref 9–23)
BUN: 14 mg/dL (ref 6–24)
CALCIUM: 9.9 mg/dL (ref 8.7–10.2)
CO2: 25 mmol/L (ref 20–29)
CREATININE: 0.58 mg/dL (ref 0.57–1.00)
Chloride: 98 mmol/L (ref 96–106)
GFR calc Af Amer: 117 mL/min/{1.73_m2} (ref >59)
GFR, EST NON AFRICAN AMERICAN: 101 mL/min/{1.73_m2} (ref >59)
Glucose: 85 mg/dL (ref 65–99)
Potassium: 4.6 mmol/L (ref 3.5–5.2)
Sodium: 141 mmol/L (ref 134–144)

## 2018-03-01 ENCOUNTER — Telehealth: Payer: Self-pay

## 2018-03-01 NOTE — Telephone Encounter (Signed)
Pt advised.

## 2018-03-01 NOTE — Telephone Encounter (Signed)
-----   Message from Virginia Crews, MD sent at 03/01/2018  8:53 AM EDT ----- Normal kidney function, blood sugar, and electrolytes.  Virginia Crews, MD, MPH [facility] 03/01/2018 8:53 AM

## 2018-07-14 ENCOUNTER — Encounter: Payer: Managed Care, Other (non HMO) | Admitting: Obstetrics and Gynecology

## 2018-07-16 NOTE — Progress Notes (Signed)
ANNUAL PREVENTATIVE CARE GYN  ENCOUNTER NOTE  Subjective:       Angela Thomas is a 57 y.o. [phone] female here for a routine annual gynecologic exam.  Current complaints: 1.  S/p uti - treated with bactrim will recheck today; patient was asymptomatic last week except for noting pink-tinged urine.  Relative dipped her urine which was notable for blood and leukocytes; subsequently was treated with Bactrim with resolution of hematuria.  History of endometrial cancer, stage I, status post robotically-assisted TL H BSO with lymph node sampling. Endometrial cancer management summary:  Endometrial cancer Management summary: 07/26/2012, hysteroscopy/D&C . Pathology: Endometrioid adenocarcinoma with mucinous differentiation, grade 1 08/2012. Robotically-assisted total laparoscopic hysterectomy, BSO, sentinel lymph node dissection. 05/04/2013. No evidence of disease. 02/22/2014. No evidence of disease             Interval workup by Dr. Rosanna Randy, primary care: 10/01/2015 CT abdomen and pelvis with contrast-no acute findings 02/12/2016 abdominal ultrasound-negative gallstones; probable fatty infiltration of labor; no acute findings 12/07/2016 stool guaiac cards negative 01/01/2017 NED 07/22/2018 NED  Gynecologic History No LMP recorded. Patient has had a hysterectomy. Contraception: abstinence Last Pap: 07/08/2017 neg. Results were: normal Last mammogram: 12/16/2017  birad 1. Results were: normal Last colonoscopy- 06/2017- wnl  Obstetric History OB History  Gravida Para Term Preterm AB Living  2 2 2     2   SAB TAB Ectopic Multiple Live Births          2    # Outcome Date GA Lbr Len/2nd Weight Sex Delivery Anes PTL Lv  2 Term 1987   8 lb 9.6 oz (3.901 kg) F Vag-Spont   LIV  1 Term 1985   8 lb 2.2 oz (3.692 kg) F Vag-Spont   LIV    Past Medical History:  Diagnosis Date  . Cancer [facility])    Endometrial  . Colitis   . Colitis   . DDD  (degenerative disc disease), lumbar     Past Surgical History:  Procedure Laterality Date  . ABDOMINAL HYSTERECTOMY     total  . BREAST CYST ASPIRATION Bilateral   . BUNIONECTOMY    . BUNIONECTOMY     With hammer toe repair  . COLONOSCOPY N/A 06/15/2017   Procedure: COLONOSCOPY;  Surgeon: Lin Landsman, MD;  Location: [facility] ENDOSCOPY;  Service: Endoscopy;  Laterality: N/A;  . CYST REMOVAL NECK    . HAMMER TOE SURGERY    . TONSILLECTOMY AND ADENOIDECTOMY      Current Outpatient Medications on File Prior to Visit  Medication Sig Dispense Refill  . ALPRAZolam (XANAX) 0.25 MG tablet Take 1 tablet (0.25 mg total) by mouth every 8 (eight) hours as needed for anxiety. 30 tablet 5  . cycloSPORINE (RESTASIS) 0.05 % ophthalmic emulsion Place 1 drop into both eyes every 12 (twelve) hours.    . Diclofenac-Misoprostol (ARTHROTEC) 75-0.2 MG TBEC Take 1 tablet by mouth 2 (two) times daily. 60 tablet 12  . fexofenadine (ALLEGRA) 180 MG tablet Take 1 tablet (180 mg total) by mouth daily. 30 tablet 12  . Flaxseed, Linseed, (FLAX SEED OIL) 1000 MG CAPS Take by mouth.    . fluticasone (FLONASE) 50 MCG/ACT nasal spray Place 2 sprays into both nostrils daily. 16 g 6  . mesalamine (LIALDA) 1.2 g EC tablet Take 4 tablets (4.8 g total) by mouth 2 (two) times daily. 240 tablet 5  . valACYclovir (VALTREX) 500 MG tablet Take 1 tablet (500 mg total) by mouth 2 (two) times daily as  needed. 60 tablet 12   No current facility-administered medications on file prior to visit.     Allergies  Allergen Reactions  . Beef-Derived Products Anaphylaxis  . Other Anaphylaxis    Reaction to red meat and pork products  . Pork-Derived Products Anaphylaxis    Social History   Socioeconomic History  . Marital status: Divorced    Spouse name: Not on file  . Number of children: Not on file  . Years of education: Not on file  . Highest education level: Not on file  Occupational History  . Not on file  Social  Needs  . Financial resource strain: Not on file  . Food insecurity:    Worry: Not on file    Inability: Not on file  . Transportation needs:    Medical: Not on file    Non-medical: Not on file  Tobacco Use  . Smoking status: Never Smoker  . Smokeless tobacco: Never Used  Substance and Sexual Activity  . Alcohol use: Yes    Alcohol/week: 1.0 standard drinks    Types: 1 Glasses of wine per week    Comment: 1-2 week  . Drug use: No  . Sexual activity: Not Currently  Lifestyle  . Physical activity:    Days per week: Not on file    Minutes per session: Not on file  . Stress: Not on file  Relationships  . Social connections:    Talks on phone: Not on file    Gets together: Not on file    Attends religious service: Not on file    Active member of club or organization: Not on file    Attends meetings of clubs or organizations: Not on file    Relationship status: Not on file  . Intimate partner violence:    Fear of current or ex partner: Not on file    Emotionally abused: Not on file    Physically abused: Not on file    Forced sexual activity: Not on file  Other Topics Concern  . Not on file  Social History Narrative  . Not on file    Family History  Problem Relation Age of Onset  . COPD Mother   . COPD Father   . Diabetes Father   . Pulmonary fibrosis Father   . Bladder Cancer Father   . Breast cancer Maternal Aunt   . Dementia Maternal Aunt   . Transient ischemic attack Maternal Uncle   . Heart disease Maternal Uncle   . Cancer Maternal Uncle        laryngeal  . Heart disease Maternal Grandfather   . Diabetes Paternal Grandfather   . Prostate cancer Paternal Grandfather   . Other Paternal Grandmother        cerebral hemorrhage  . Ovarian cancer Neg Hx     The following portions of the patient's history were reviewed and updated as appropriate: allergies, current medications, past family history, past medical history, past social history, past surgical history  and problem list.  Review of Systems Review of Systems  Constitutional: Negative.   HENT: Negative.   Eyes: Negative.   Respiratory: Negative.   Cardiovascular: Negative.   Gastrointestinal: Negative.   Genitourinary: Negative.   Musculoskeletal: Negative.   Skin: Negative.   Neurological: Negative.   Endo/Heme/Allergies: Negative.   Psychiatric/Behavioral: Negative.       Objective:  BP 131/80   Pulse 76   Ht 5' 1"  (1.549 m)   Wt 176 lb 8 oz (80.1  kg)   BMI 33.35 kg/m  CONSTITUTIONAL: Well-developed, well-nourished female in no acute distress.  PSYCHIATRIC: Normal mood and affect. Normal behavior. Normal judgment and thought content. [place]: Alert and oriented to person, place, and time. Normal muscle tone coordination. No cranial nerve deficit noted. HENT:  Normocephalic, atraumatic, External right and left ear normal. EYES: Conjunctivae and EOM are normal. Pupils are equal, round, and reactive to light. No scleral icterus.  NECK: Normal range of motion, supple, no masses.  Normal thyroid.  SKIN: Skin is warm and dry. No rash noted. Not diaphoretic. No erythema. No pallor. CARDIOVASCULAR: Normal heart rate noted, regular rhythm, no murmur. RESPIRATORY: Clear to auscultation bilaterally. Effort and breath sounds normal, no problems with respiration noted. BREASTS: Symmetric in size. No masses, skin changes, nipple drainage, or lymphadenopathy. ABDOMEN: Soft, normal bowel sounds, no distention noted.  No tenderness, rebound or guarding.  BLADDER: Normal PELVIC: (Exam unchanged)  External Genitalia: Normal  BUS: Normal  Vagina: Atrophic changes; good vault support  Cervix: Surgically absent  Uterus: Surgically absent  Adnexa: Normal; nonpalpable nontender  RV: Internal exam deferred due to recent colonoscopy and External Exam NormaI  MUSCULOSKELETAL: Normal range of motion. No tenderness.  No cyanosis, clubbing, or edema.  2+ distal pulses. LYMPHATIC: No Axillary,  Supraclavicular, or Inguinal Adenopathy.    Assessment:   Annual gynecologic examination 57 y.o. Contraception: none [id number] Problem List Items Addressed This Visit    Vaginal atrophy    Other Visit Diagnoses    Well woman exam with routine gynecological exam    -  Primary   Status post TAH-BSO       History of endometrial cancer       Screen for colon cancer        History of recent UTI, status post treatment, asymptomatic; test of cure are ordered Status post endometrial cancer diagnosis 5.0years, continues to be NED; may resume annual physicals Plan:  Pap: Pap, Reflex if ASCUS Mammogram: utd Stool Guaiac Testing:  ordered Labs: thru pcp urinalysis and urine culture is obtained today Routine preventative health maintenance measures emphasized: Exercise/Diet/Weight control, Tobacco Warnings and Alcohol/Substance use risks Recommend calcium 6 or milligrams twice a day and vitamin D 400 international units twice a day Return to [place], resuming annual wellness exams   Joyice Faster, CMA' Brayton Mars, MD  Note: This dictation was prepared with Dragon dictation along with smaller phrase technology. Any transcriptional errors that result from this process are unintentional.

## 2018-07-21 ENCOUNTER — Encounter: Payer: Self-pay | Admitting: Obstetrics and Gynecology

## 2018-07-21 ENCOUNTER — Other Ambulatory Visit (HOSPITAL_COMMUNITY)
Admission: RE | Admit: 2018-07-21 | Discharge: 2018-07-21 | Disposition: A | Discharge status: Home / Self Care | Payer: Managed Care, Other (non HMO) | Source: Ambulatory Visit | Attending: Obstetrics and Gynecology | Admitting: Obstetrics and Gynecology

## 2018-07-21 ENCOUNTER — Ambulatory Visit (INDEPENDENT_AMBULATORY_CARE_PROVIDER_SITE_OTHER): Payer: Managed Care, Other (non HMO) | Admitting: Obstetrics and Gynecology

## 2018-07-21 VITALS — BP 131/80 | HR 76 | Ht 61.0 in | Wt 176.5 lb

## 2018-07-21 DIAGNOSIS — Z90710 Acquired absence of both cervix and uterus: Secondary | ICD-10-CM

## 2018-07-21 DIAGNOSIS — N952 Postmenopausal atrophic vaginitis: Secondary | ICD-10-CM

## 2018-07-21 DIAGNOSIS — Z90722 Acquired absence of ovaries, bilateral: Secondary | ICD-10-CM

## 2018-07-21 DIAGNOSIS — Z01419 Encounter for gynecological examination (general) (routine) without abnormal findings: Secondary | ICD-10-CM

## 2018-07-21 DIAGNOSIS — N390 Urinary tract infection, site not specified: Secondary | ICD-10-CM | POA: Diagnosis not present

## 2018-07-21 DIAGNOSIS — Z6833 Body mass index (BMI) 33.0-33.9, adult: Secondary | ICD-10-CM

## 2018-07-21 DIAGNOSIS — Z1211 Encounter for screening for malignant neoplasm of colon: Secondary | ICD-10-CM

## 2018-07-21 DIAGNOSIS — B001 Herpesviral vesicular dermatitis: Secondary | ICD-10-CM

## 2018-07-21 DIAGNOSIS — Z8542 Personal history of malignant neoplasm of other parts of uterus: Secondary | ICD-10-CM

## 2018-07-21 DIAGNOSIS — Z9079 Acquired absence of other genital organ(s): Secondary | ICD-10-CM

## 2018-07-21 LAB — POCT URINALYSIS DIPSTICK
Bilirubin, UA: NEGATIVE
Blood, UA: NEGATIVE
Glucose, UA: NEGATIVE
KETONES UA: NEGATIVE
Nitrite, UA: NEGATIVE
Odor: NEGATIVE
PROTEIN UA: NEGATIVE
Urobilinogen, UA: 0.2 E.U./dL
pH, UA: 6 (ref 5.0–8.0)

## 2018-07-21 MED ORDER — VALACYCLOVIR HCL 500 MG PO TABS: 500.0000 mg | ORAL_TABLET | Freq: Two times a day (BID) | ORAL | 12 refills | Status: DC | PRN

## 2018-07-21 NOTE — Patient Instructions (Signed)
1.  Pap smear is done. 2.  Mammogram is up-to-date. 3.  Stool guaiac cards are given for colon cancer screening. 4.  Screening labs are obtained through primary care 5.  Recommend calcium 600 mg twice a day and vitamin D 400 international units twice a day 6.  Continue with healthy eating and exercise. 7.  Return in 1 year for annual exam.   Health Maintenance for Postmenopausal Women Menopause is a normal process in which your reproductive ability comes to an end. This process happens gradually over a span of months to years, usually between the ages of 28 and 79. Menopause is complete when you have missed 12 consecutive menstrual periods. It is important to talk with your health care provider about some of the most common conditions that affect postmenopausal women, such as heart disease, cancer, and bone loss (osteoporosis). Adopting a healthy lifestyle and getting preventive care can help to promote your health and wellness. Those actions can also lower your chances of developing some of these common conditions. What should I know about menopause? During menopause, you may experience a number of symptoms, such as:  Moderate-to-severe hot flashes.  Night sweats.  Decrease in sex drive.  Mood swings.  Headaches.  Tiredness.  Irritability.  Memory problems.  Insomnia.  Choosing to treat or not to treat menopausal changes is an individual decision that you make with your health care provider. What should I know about hormone replacement therapy and supplements? Hormone therapy products are effective for treating symptoms that are associated with menopause, such as hot flashes and night sweats. Hormone replacement carries certain risks, especially as you become older. If you are thinking about using estrogen or estrogen with progestin treatments, discuss the benefits and risks with your health care provider. What should I know about heart disease and stroke? Heart disease, heart  attack, and stroke become more likely as you age. This may be due, in part, to the hormonal changes that your body experiences during menopause. These can affect how your body processes dietary fats, triglycerides, and cholesterol. Heart attack and stroke are both medical emergencies. There are many things that you can do to help prevent heart disease and stroke:  Have your blood pressure checked at least every 1-2 years. High blood pressure causes heart disease and increases the risk of stroke.  If you are 36-28 years old, ask your health care provider if you should take aspirin to prevent a heart attack or a stroke.  Do not use any tobacco products, including cigarettes, chewing tobacco, or electronic cigarettes. If you need help quitting, ask your health care provider.  It is important to eat a healthy diet and maintain a healthy weight. ? Be sure to include plenty of vegetables, fruits, low-fat dairy products, and lean protein. ? Avoid eating foods that are high in solid fats, added sugars, or salt (sodium).  Get regular exercise. This is one of the most important things that you can do for your health. ? Try to exercise for at least 150 minutes each week. The type of exercise that you do should increase your heart rate and make you sweat. This is known as moderate-intensity exercise. ? Try to do strengthening exercises at least twice each week. Do these in addition to the moderate-intensity exercise.  Know your numbers.Ask your health care provider to check your cholesterol and your blood glucose. Continue to have your blood tested as directed by your health care provider.  What should I know about cancer  screening? There are several types of cancer. Take the following steps to reduce your risk and to catch any cancer development as early as possible. Breast Cancer  Practice breast self-awareness. ? This means understanding how your breasts normally appear and feel. ? It also means  doing regular breast self-exams. Let your health care provider know about any changes, no matter how small.  If you are 69 or older, have a clinician do a breast exam (clinical breast exam or CBE) every year. Depending on your age, family history, and medical history, it may be recommended that you also have a yearly breast X-ray (mammogram).  If you have a family history of breast cancer, talk with your health care provider about genetic screening.  If you are at high risk for breast cancer, talk with your health care provider about having an MRI and a mammogram every year.  Breast cancer (BRCA) gene test is recommended for women who have family members with BRCA-related cancers. Results of the assessment will determine the need for genetic counseling and BRCA1 and for BRCA2 testing. BRCA-related cancers include these types: ? Breast. This occurs in males or females. ? Ovarian. ? Tubal. This may also be called fallopian tube cancer. ? Cancer of the abdominal or pelvic lining (peritoneal cancer). ? Prostate. ? Pancreatic.  Cervical, Uterine, and Ovarian Cancer Your health care provider may recommend that you be screened regularly for cancer of the pelvic organs. These include your ovaries, uterus, and vagina. This screening involves a pelvic exam, which includes checking for microscopic changes to the surface of your cervix (Pap test).  For women ages 21-65, health care providers may recommend a pelvic exam and a Pap test every three years. For women ages 75-65, they may recommend the Pap test and pelvic exam, combined with testing for human papilloma virus (HPV), every five years. Some types of HPV increase your risk of cervical cancer. Testing for HPV may also be done on women of any age who have unclear Pap test results.  Other health care providers may not recommend any screening for nonpregnant women who are considered low risk for pelvic cancer and have no symptoms. Ask your health care  provider if a screening pelvic exam is right for you.  If you have had past treatment for cervical cancer or a condition that could lead to cancer, you need Pap tests and screening for cancer for at least 20 years after your treatment. If Pap tests have been discontinued for you, your risk factors (such as having a new sexual partner) need to be reassessed to determine if you should start having screenings again. Some women have medical problems that increase the chance of getting cervical cancer. In these cases, your health care provider may recommend that you have screening and Pap tests more often.  If you have a family history of uterine cancer or ovarian cancer, talk with your health care provider about genetic screening.  If you have vaginal bleeding after reaching menopause, tell your health care provider.  There are currently no reliable tests available to screen for ovarian cancer.  Lung Cancer Lung cancer screening is recommended for adults 70-29 years old who are at high risk for lung cancer because of a history of smoking. A yearly low-dose CT scan of the lungs is recommended if you:  Currently smoke.  Have a history of at least 30 pack-years of smoking and you currently smoke or have quit within the past 15 years. A pack-year is smoking  an average of one pack of cigarettes per day for one year.  Yearly screening should:  Continue until it has been 15 years since you quit.  Stop if you develop a health problem that would prevent you from having lung cancer treatment.  Colorectal Cancer  This type of cancer can be detected and can often be prevented.  Routine colorectal cancer screening usually begins at age 50 and continues through age 19.  If you have risk factors for colon cancer, your health care provider may recommend that you be screened at an earlier age.  If you have a family history of colorectal cancer, talk with your health care provider about genetic  screening.  Your health care provider may also recommend using home test kits to check for hidden blood in your stool.  A small camera at the end of a tube can be used to examine your colon directly (sigmoidoscopy or colonoscopy). This is done to check for the earliest forms of colorectal cancer.  Direct examination of the colon should be repeated every 5-10 years until age 20. However, if early forms of precancerous polyps or small growths are found or if you have a family history or genetic risk for colorectal cancer, you may need to be screened more often.  Skin Cancer  Check your skin from head to toe regularly.  Monitor any moles. Be sure to tell your health care provider: ? About any new moles or changes in moles, especially if there is a change in a mole's shape or color. ? If you have a mole that is larger than the size of a pencil eraser.  If any of your family members has a history of skin cancer, especially at a young age, talk with your health care provider about genetic screening.  Always use sunscreen. Apply sunscreen liberally and repeatedly throughout the day.  Whenever you are outside, protect yourself by wearing long sleeves, pants, a wide-brimmed hat, and sunglasses.  What should I know about osteoporosis? Osteoporosis is a condition in which bone destruction happens more quickly than new bone creation. After menopause, you may be at an increased risk for osteoporosis. To help prevent osteoporosis or the bone fractures that can happen because of osteoporosis, the following is recommended:  If you are 37-3 years old, get at least 1,000 mg of calcium and at least 600 mg of vitamin D per day.  If you are older than age 68 but younger than age 45, get at least 1,200 mg of calcium and at least 600 mg of vitamin D per day.  If you are older than age 34, get at least 1,200 mg of calcium and at least 800 mg of vitamin D per day.  Smoking and excessive alcohol intake  increase the risk of osteoporosis. Eat foods that are rich in calcium and vitamin D, and do weight-bearing exercises several times each week as directed by your health care provider. What should I know about how menopause affects my mental health? Depression may occur at any age, but it is more common as you become older. Common symptoms of depression include:  Low or sad mood.  Changes in sleep patterns.  Changes in appetite or eating patterns.  Feeling an overall lack of motivation or enjoyment of activities that you previously enjoyed.  Frequent crying spells.  Talk with your health care provider if you think that you are experiencing depression. What should I know about immunizations? It is important that you get and maintain your  immunizations. These include:  Tetanus, diphtheria, and pertussis (Tdap) booster vaccine.  Influenza every year before the flu season begins.  Pneumonia vaccine.  Shingles vaccine.  Your health care provider may also recommend other immunizations. This information is not intended to replace advice given to you by your health care provider. Make sure you discuss any questions you have with your health care provider. Document Released: 12/12/2005 Document Revised: 05/09/2016 Document Reviewed: 07/24/2015 Elsevier Interactive Patient Education  2018 [employer].

## 2018-07-23 LAB — CYTOLOGY - PAP: Diagnosis: NEGATIVE

## 2018-07-23 LAB — URINE CULTURE: Organism ID, Bacteria: NO GROWTH

## 2018-08-18 ENCOUNTER — Ambulatory Visit (INDEPENDENT_AMBULATORY_CARE_PROVIDER_SITE_OTHER): Payer: Managed Care, Other (non HMO) | Admitting: Family Medicine

## 2018-08-18 VITALS — BP 116/76 | HR 73 | Temp 98.5°F | Resp 16 | Ht 61.0 in | Wt 179.0 lb

## 2018-08-18 DIAGNOSIS — Z0000 Encounter for general adult medical examination without abnormal findings: Secondary | ICD-10-CM | POA: Diagnosis not present

## 2018-08-18 DIAGNOSIS — Z91018 Allergy to other foods: Secondary | ICD-10-CM

## 2018-08-18 DIAGNOSIS — C541 Malignant neoplasm of endometrium: Secondary | ICD-10-CM | POA: Diagnosis not present

## 2018-08-18 NOTE — Progress Notes (Signed)
Patient: Angela Thomas, Female    DOB: 10/08/1961, 57 y.o.   MRN: [id number] Visit Date: 08/18/2018  Today's Provider: [name], MD   Chief Complaint  Patient presents with  . Annual Exam   Subjective:  Angela Thomas is a 57 y.o. female who presents today for health maintenance and complete physical. She feels well. She reports exercising 5 times daily. She reports she is sleeping well. She has seen Dr D for her gyn exam.  06/14/17 Colonoscopy, Dr Worthy Flank chronic active colitis. 12/16/17 Mammogram-normal 08/31/12 BMD-normal 07/13/17 Pap smear, Dr DeFrancesco-negative, patient s/p hysterectomy for endometriod adenocarcinoma   Review of Systems  Constitutional: Negative.   HENT: Negative.   Eyes: Negative.   Respiratory: Negative.   Cardiovascular: Negative.   Gastrointestinal: Negative.   Endocrine: Negative.   Genitourinary: Negative.   Musculoskeletal: Negative.   Skin: Negative.   Allergic/Immunologic: Positive for food allergies.  Neurological: Negative.   Hematological: Negative.   Psychiatric/Behavioral: Negative.     Social History   Socioeconomic History  . Marital status: Divorced    Spouse name: Not on file  . Number of children: Not on file  . Years of education: Not on file  . Highest education level: Not on file  Occupational History  . Not on file  Social Needs  . Financial resource strain: Not on file  . Food insecurity:    Worry: Not on file    Inability: Not on file  . Transportation needs:    Medical: Not on file    Non-medical: Not on file  Tobacco Use  . Smoking status: Never Smoker  . Smokeless tobacco: Never Used  Substance and Sexual Activity  . Alcohol use: Yes    Alcohol/week: 1.0 standard drinks    Types: 1 Glasses of wine per week    Comment: occas  . Drug use: No  . Sexual activity: Not Currently  Lifestyle  . Physical activity:    Days per week: 5 days    Minutes per session: 30 min  . Stress: Not on file   Relationships  . Social connections:    Talks on phone: Not on file    Gets together: Not on file    Attends religious service: Not on file    Active member of club or organization: Not on file    Attends meetings of clubs or organizations: Not on file    Relationship status: Not on file  . Intimate partner violence:    Fear of current or ex partner: Not on file    Emotionally abused: Not on file    Physically abused: Not on file    Forced sexual activity: Not on file  Other Topics Concern  . Not on file  Social History Narrative  . Not on file    Patient Active Problem List   Diagnosis Date Noted  . Ulcerative colitis ([place]) 07/13/2017  . Corneal scar and opacity 07/08/2017  . Presbyopia 07/08/2017  . Myopia 07/08/2017  . Hematochezia 06/14/2017  . Other fatigue 06/18/2016  . Vaginal atrophy 06/18/2016  . DDD (degenerative disc disease), lumbar 04/12/2015  . Vitamin D deficiency 04/12/2015  . Obesity 04/12/2015  . Acute anxiety 04/12/2015  . Allergic rhinitis 04/12/2015  . Fibrocystic breast disease 04/12/2015  . Hyperlipidemia 02/18/2013  . Arthritis 02/18/2013  . Cancer of endometrium ([place]) 08/02/2012    Past Surgical History:  Procedure Laterality Date  . ABDOMINAL HYSTERECTOMY     total  . BREAST CYST  ASPIRATION Bilateral   . BUNIONECTOMY    . BUNIONECTOMY     With hammer toe repair  . COLONOSCOPY N/A 06/15/2017   Procedure: COLONOSCOPY;  Surgeon: Lin Landsman, MD;  Location: [facility] ENDOSCOPY;  Service: Endoscopy;  Laterality: N/A;  . CYST REMOVAL NECK    . HAMMER TOE SURGERY    . TONSILLECTOMY AND ADENOIDECTOMY      Her family history includes Bladder Cancer in her father; Breast cancer in her maternal aunt; COPD in her father and mother; Cancer in her maternal uncle; Dementia in her maternal aunt; Diabetes in her father and paternal grandfather; Heart disease in her maternal grandfather and maternal uncle; Other in her paternal grandmother; Prostate  cancer in her paternal grandfather; Pulmonary fibrosis in her father; Transient ischemic attack in her maternal uncle.     Outpatient Encounter Medications as of 08/18/2018  Medication Sig  . ALPRAZolam (XANAX) 0.25 MG tablet Take 1 tablet (0.25 mg total) by mouth every 8 (eight) hours as needed for anxiety.  . cycloSPORINE (RESTASIS) 0.05 % ophthalmic emulsion Place 1 drop into both eyes every 12 (twelve) hours.  . Diclofenac-Misoprostol (ARTHROTEC) 75-0.2 MG TBEC Take 1 tablet by mouth 2 (two) times daily.  . fexofenadine (ALLEGRA) 180 MG tablet Take 1 tablet (180 mg total) by mouth daily.  . Flaxseed, Linseed, (FLAX SEED OIL) 1000 MG CAPS Take by mouth.  . valACYclovir (VALTREX) 500 MG tablet Take 1 tablet (500 mg total) by mouth 2 (two) times daily as needed.  . mesalamine (LIALDA) 1.2 g EC tablet Take 4 tablets (4.8 g total) by mouth 2 (two) times daily. (Patient taking differently: Take 1.2 g by mouth 2 (two) times daily. )   No facility-administered encounter medications on file as of 08/18/2018.     Patient Care Team: Jerrol Banana., MD as PCP - General (Family Medicine)      Objective:   Vitals:  Vitals:   08/18/18 0905  BP: 116/76  Pulse: 73  Resp: 16  Temp: 98.5 F (36.9 C)  TempSrc: Oral  Weight: 179 lb (81.2 kg)  Height: 5' 1"  (1.549 m)    Physical Exam  Constitutional: She is oriented to person, place, and time. She appears well-developed and well-nourished.  HENT:  Head: Normocephalic and atraumatic.  Right Ear: External ear normal.  Left Ear: External ear normal.  Nose: Nose normal.  Mouth/Throat: Oropharynx is clear and moist.  Eyes: Pupils are equal, round, and reactive to light. Conjunctivae and EOM are normal.  Neck: Normal range of motion. Neck supple.  Cardiovascular: Normal rate, regular rhythm, normal heart sounds and intact distal pulses.  Pulmonary/Chest: Effort normal and breath sounds normal.  Abdominal: Soft. Bowel sounds are normal.   Musculoskeletal: Normal range of motion.  Neurological: She is alert and oriented to person, place, and time.  Skin: Skin is warm and dry.  Psychiatric: She has a normal mood and affect. Her behavior is normal. Judgment and thought content normal.     Depression Screen PHQ 2/9 Scores 08/12/2017 08/06/2016  PHQ - 2 Score 0 0  PHQ- 9 Score 0 -      Assessment & Plan:     Routine Health Maintenance and Physical Exam  Exercise Activities and Dietary recommendations Goals   None     Immunization History  Administered Date(s) Administered  . Influenza-Unspecified 09/03/2015, 08/03/2017  . Tdap 12/09/2012    Health Maintenance  Topic Date Due  . HIV Screening  11/23/1973  . MAMMOGRAM  12/17/2019  . PAP SMEAR  07/21/2021  . TETANUS/TDAP  12/09/2022  . COLONOSCOPY  06/16/2027  . INFLUENZA VACCINE  Completed  . Hepatitis C Screening  Completed     Discussed health benefits of physical activity, and encouraged her to engage in regular exercise appropriate for her age and condition.  1. Annual physical exam  - CBC with Differential/Platelet - Comprehensive metabolic panel - Lipid Panel With LDL/HDL Ratio - TSH  2.h/o  Cancer of endometrium (HCC)/s/p Hysterectomy Followed by Gyn yearly   3. Allergy to alpha-gal RF Epi pen.  I have done the exam and reviewed the chart and it is accurate to the best of my knowledge. Development worker, community has been used and  any errors in dictation or transcription are unintentional. Miguel Aschoff M.D. [facility]

## 2018-08-30 LAB — FECAL OCCULT BLOOD, IMMUNOCHEMICAL: Fecal Occult Bld: NEGATIVE

## 2018-09-01 LAB — CBC WITH DIFFERENTIAL/PLATELET
BASOS: 0 %
Basophils Absolute: 0 10*3/uL (ref 0.0–0.2)
EOS (ABSOLUTE): 0.1 10*3/uL (ref 0.0–0.4)
EOS: 2 %
HEMATOCRIT: 42.8 % (ref 34.0–46.6)
Hemoglobin: 13.9 g/dL (ref 11.1–15.9)
Immature Grans (Abs): 0 10*3/uL (ref 0.0–0.1)
Immature Granulocytes: 0 %
LYMPHS ABS: 1.6 10*3/uL (ref 0.7–3.1)
Lymphs: 27 %
MCH: 30 pg (ref 26.6–33.0)
MCHC: 32.5 g/dL (ref 31.5–35.7)
MCV: 92 fL (ref 79–97)
MONOS ABS: 0.5 10*3/uL (ref 0.1–0.9)
Monocytes: 8 %
Neutrophils Absolute: 3.9 10*3/uL (ref 1.4–7.0)
Neutrophils: 63 %
Platelets: 311 10*3/uL (ref 150–450)
RBC: 4.63 x10E6/uL (ref 3.77–5.28)
RDW: 13 % (ref 12.3–15.4)
WBC: 6.1 10*3/uL (ref 3.4–10.8)

## 2018-09-01 LAB — LIPID PANEL WITH LDL/HDL RATIO
Cholesterol, Total: 246 mg/dL — ABNORMAL HIGH (ref 100–199)
HDL: 57 mg/dL (ref >39)
LDL Calculated: 169 mg/dL — ABNORMAL HIGH (ref 0–99)
LDL/HDL RATIO: 3 ratio (ref 0.0–3.2)
Triglycerides: 100 mg/dL (ref 0–149)
VLDL Cholesterol Cal: 20 mg/dL (ref 5–40)

## 2018-09-01 LAB — COMPREHENSIVE METABOLIC PANEL
A/G RATIO: 1.7 (ref 1.2–2.2)
ALK PHOS: 100 IU/L (ref 39–117)
ALT: 31 IU/L (ref 0–32)
AST: 28 IU/L (ref 0–40)
Albumin: 4.6 g/dL (ref 3.5–5.5)
BUN/Creatinine Ratio: 24 — ABNORMAL HIGH (ref 9–23)
BUN: 14 mg/dL (ref 6–24)
Bilirubin Total: 0.3 mg/dL (ref 0.0–1.2)
CHLORIDE: 100 mmol/L (ref 96–106)
CO2: 25 mmol/L (ref 20–29)
CREATININE: 0.58 mg/dL (ref 0.57–1.00)
Calcium: 9.9 mg/dL (ref 8.7–10.2)
GFR, EST AFRICAN AMERICAN: 117 mL/min/{1.73_m2} (ref >59)
GFR, EST NON AFRICAN AMERICAN: 101 mL/min/{1.73_m2} (ref >59)
GLOBULIN, TOTAL: 2.7 g/dL (ref 1.5–4.5)
Glucose: 93 mg/dL (ref 65–99)
Potassium: 4.7 mmol/L (ref 3.5–5.2)
Sodium: 144 mmol/L (ref 134–144)
Total Protein: 7.3 g/dL (ref 6.0–8.5)

## 2018-09-01 LAB — TSH: TSH: 2.3 u[IU]/mL (ref 0.450–4.500)

## 2018-09-03 ENCOUNTER — Other Ambulatory Visit: Payer: Self-pay | Admitting: Family Medicine

## 2018-09-03 DIAGNOSIS — F419 Anxiety disorder, unspecified: Secondary | ICD-10-CM

## 2018-09-03 NOTE — Telephone Encounter (Signed)
Pt was a couple weeks a go for a PE with Dr. Rosanna Randy and needed refills on a couple of medications.  She checked with the pharmacy and they were not sent in  [facility]  Alprazolam .025 mg  [facility]  Thanks  teri

## 2018-09-06 ENCOUNTER — Other Ambulatory Visit: Payer: Self-pay | Admitting: Family Medicine

## 2018-09-06 DIAGNOSIS — F419 Anxiety disorder, unspecified: Secondary | ICD-10-CM

## 2018-09-06 MED ORDER — ALPRAZOLAM 0.25 MG PO TABS: 0.2500 mg | ORAL_TABLET | Freq: Three times a day (TID) | ORAL | 5 refills | Status: DC | PRN

## 2018-09-06 MED ORDER — EPINEPHRINE 0.3 MG/0.3ML IJ SOAJ: 0.3000 mg | Freq: Once | INTRAMUSCULAR | 12 refills | Status: DC

## 2018-09-07 ENCOUNTER — Ambulatory Visit: Payer: Managed Care, Other (non HMO) | Admitting: Gastroenterology

## 2018-09-13 ENCOUNTER — Other Ambulatory Visit: Payer: Self-pay | Admitting: Family Medicine

## 2018-10-14 ENCOUNTER — Encounter: Payer: Self-pay | Admitting: Physician Assistant

## 2018-10-14 ENCOUNTER — Ambulatory Visit: Payer: Managed Care, Other (non HMO) | Admitting: Physician Assistant

## 2018-10-14 VITALS — BP 133/86 | HR 84 | Temp 97.6°F | Wt 180.0 lb

## 2018-10-14 DIAGNOSIS — J0100 Acute maxillary sinusitis, unspecified: Secondary | ICD-10-CM | POA: Diagnosis not present

## 2018-10-14 MED ORDER — AMOXICILLIN-POT CLAVULANATE 875-125 MG PO TABS: 1.0000 | ORAL_TABLET | Freq: Two times a day (BID) | ORAL | 0 refills | Status: AC

## 2018-10-14 NOTE — Progress Notes (Signed)
Patient: Angela Thomas Female    DOB: 1961/01/15   57 y.o.   MRN: [id number] Visit Date: 10/18/2018  Today's Provider: Trinna Post, PA-C   Chief Complaint  Patient presents with  . URI   Subjective:     HPI  Upper Respiratory Infection Patient presents today for URI symptoms for about 1 week. Patient is having the following symptoms: ear pain, fever, cough, sinus pressure and pain. Patient has taking Tylenol and Mucinex. Right sided facial pain today. Allegra daily.   Allergies  Allergen Reactions  . Beef-Derived Products Anaphylaxis  . Other Anaphylaxis    Reaction to red meat and pork products  . Pork-Derived Products Anaphylaxis     Current Outpatient Medications:  .  ALLERGY RELIEF 180 MG tablet, TAKE 1 TABLET DAILY, Disp: 30 tablet, Rfl: 12 .  ALPRAZolam (XANAX) 0.25 MG tablet, Take 1 tablet (0.25 mg total) by mouth every 8 (eight) hours as needed for anxiety., Disp: 30 tablet, Rfl: 5 .  cycloSPORINE (RESTASIS) 0.05 % ophthalmic emulsion, Place 1 drop into both eyes every 12 (twelve) hours., Disp: , Rfl:  .  Diclofenac-Misoprostol (ARTHROTEC) 75-0.2 MG TBEC, Take 1 tablet by mouth 2 (two) times daily., Disp: 60 tablet, Rfl: 12 .  Flaxseed, Linseed, (FLAX SEED OIL) 1000 MG CAPS, Take by mouth., Disp: , Rfl:  .  valACYclovir (VALTREX) 500 MG tablet, Take 1 tablet (500 mg total) by mouth 2 (two) times daily as needed., Disp: 60 tablet, Rfl: 12 .  amoxicillin-clavulanate (AUGMENTIN) 875-125 MG tablet, Take 1 tablet by mouth 2 (two) times daily for 7 days., Disp: 14 tablet, Rfl: 0 .  mesalamine (LIALDA) 1.2 g EC tablet, Take 4 tablets (4.8 g total) by mouth 2 (two) times daily. (Patient taking differently: Take 1.2 g by mouth 2 (two) times daily. ), Disp: 240 tablet, Rfl: 5  Review of Systems  Constitutional: Positive for fever.  HENT: Positive for ear pain, sinus pressure, sinus pain and sneezing.   Respiratory: Positive for cough.   Gastrointestinal:  Negative.   Neurological: Negative.     Social History   Tobacco Use  . Smoking status: Never Smoker  . Smokeless tobacco: Never Used  Substance Use Topics  . Alcohol use: Yes    Alcohol/week: 1.0 standard drinks    Types: 1 Glasses of wine per week    Comment: occas      Objective:   BP 133/86 (BP Location: Left Arm, Patient Position: Sitting, Cuff Size: Large)   Pulse 84   Temp 97.6 F (36.4 C) (Oral)   Wt 180 lb (81.6 kg)   SpO2 98%   BMI 34.01 kg/m  Vitals:   10/14/18 1629  BP: 133/86  Pulse: 84  Temp: 97.6 F (36.4 C)  TempSrc: Oral  SpO2: 98%  Weight: 180 lb (81.6 kg)     Physical Exam Constitutional:      General: She is not in acute distress.    Appearance: She is well-developed. She is not diaphoretic.  HENT:     Right Ear: External ear normal.     Left Ear: External ear normal.     Nose:     Right Sinus: Maxillary sinus tenderness and frontal sinus tenderness present.     Left Sinus: Maxillary sinus tenderness and frontal sinus tenderness present.     Mouth/Throat:     Pharynx: No oropharyngeal exudate or posterior oropharyngeal erythema.  Eyes:     General:  Right eye: No discharge.        Left eye: No discharge.     Conjunctiva/sclera: Conjunctivae normal.  Neck:     Musculoskeletal: Neck supple.  Cardiovascular:     Rate and Rhythm: Normal rate and regular rhythm.  Pulmonary:     Effort: Pulmonary effort is normal.     Breath sounds: Normal breath sounds.  Lymphadenopathy:     Cervical: Cervical adenopathy present.  Skin:    General: Skin is warm and dry.  Neurological:     Mental Status: She is alert and oriented to person, place, and time.  Psychiatric:        Behavior: Behavior normal.         Assessment & Plan    1. Acute non-recurrent maxillary sinusitis  - amoxicillin-clavulanate (AUGMENTIN) 875-125 MG tablet; Take 1 tablet by mouth 2 (two) times daily for 7 days.  Dispense: 14 tablet; Refill: 0  Return if  symptoms worsen or fail to improve.  The entirety of the information documented in the History of Present Illness, Review of Systems and Physical Exam were personally obtained by me. Portions of this information were initially documented by Hurman Horn, CMA and reviewed by me for thoroughness and accuracy.          Trinna Post, PA-C  [facility]

## 2018-10-14 NOTE — Patient Instructions (Signed)

## 2018-10-25 ENCOUNTER — Telehealth: Payer: Self-pay | Admitting: Physician Assistant

## 2018-10-25 DIAGNOSIS — B379 Candidiasis, unspecified: Secondary | ICD-10-CM

## 2018-10-25 DIAGNOSIS — T3695XA Adverse effect of unspecified systemic antibiotic, initial encounter: Principal | ICD-10-CM

## 2018-10-25 MED ORDER — FLUCONAZOLE 150 MG PO TABS: 150.0000 mg | ORAL_TABLET | Freq: Once | ORAL | 0 refills | Status: AC

## 2018-10-25 NOTE — Telephone Encounter (Signed)
Please send in diflucan 150 mb tabs #2 with instructions "take on pill on day one. Take second pill three days later if symptoms persist." Thank you.

## 2018-10-25 NOTE — Telephone Encounter (Signed)
Patient was advised and medication send to pharmacy. 

## 2018-10-25 NOTE — Telephone Encounter (Signed)
Patient was treated with antibiotic a week or so ago.  She now has a yeast infection and would like something called in to [facility].

## 2018-12-27 ENCOUNTER — Telehealth: Payer: Self-pay | Admitting: Family Medicine

## 2018-12-27 ENCOUNTER — Other Ambulatory Visit: Payer: Self-pay | Admitting: Family Medicine

## 2018-12-27 DIAGNOSIS — M1990 Unspecified osteoarthritis, unspecified site: Secondary | ICD-10-CM

## 2018-12-27 MED ORDER — DICLOFENAC-MISOPROSTOL 75-0.2 MG PO TBEC: 1.0000 | DELAYED_RELEASE_TABLET | Freq: Two times a day (BID) | ORAL | 12 refills | Status: DC

## 2018-12-27 NOTE — Telephone Encounter (Signed)
Please review. Thanks!  

## 2018-12-27 NOTE — Telephone Encounter (Signed)
Opened in error [facility]

## 2018-12-27 NOTE — Telephone Encounter (Signed)
Pt needing a refill on:  Diclofenac-Misoprostol (ARTHROTEC) 75-0.2 MG TBEC  Please fill at:  [place], [place] - [place] [phone] (Phone) [phone] (Fax)   Thanks, [employer]

## 2019-02-01 ENCOUNTER — Other Ambulatory Visit: Payer: Self-pay | Admitting: Gastroenterology

## 2019-02-01 DIAGNOSIS — K5100 Ulcerative (chronic) pancolitis without complications: Secondary | ICD-10-CM

## 2019-02-02 MED ORDER — MESALAMINE 1.2 G PO TBEC: 1.2000 g | DELAYED_RELEASE_TABLET | Freq: Two times a day (BID) | ORAL | 0 refills | Status: DC

## 2019-02-02 NOTE — Addendum Note (Signed)
Addended by: Barnie Alderman L on: 02/02/2019 10:26 AM   Modules accepted: Orders

## 2019-02-02 NOTE — Telephone Encounter (Signed)
PT left vm regarding a medication refill that has exspired  rx mesalamine (LIALDA) 1.2 g EC tablet (Expired) [facility]

## 2019-02-10 ENCOUNTER — Other Ambulatory Visit: Payer: Self-pay

## 2019-02-10 ENCOUNTER — Ambulatory Visit (INDEPENDENT_AMBULATORY_CARE_PROVIDER_SITE_OTHER): Payer: 59 | Admitting: Family Medicine

## 2019-02-10 DIAGNOSIS — J0100 Acute maxillary sinusitis, unspecified: Secondary | ICD-10-CM | POA: Diagnosis not present

## 2019-02-10 DIAGNOSIS — J069 Acute upper respiratory infection, unspecified: Secondary | ICD-10-CM | POA: Diagnosis not present

## 2019-02-10 DIAGNOSIS — J301 Allergic rhinitis due to pollen: Secondary | ICD-10-CM

## 2019-02-10 MED ORDER — FLUTICASONE FUROATE 27.5 MCG/SPRAY NA SUSP: 2.0000 | Freq: Every day | NASAL | 12 refills | Status: DC

## 2019-02-10 MED ORDER — AMOXICILLIN-POT CLAVULANATE 875-125 MG PO TABS: 1.0000 | ORAL_TABLET | Freq: Two times a day (BID) | ORAL | 0 refills | Status: DC

## 2019-02-10 NOTE — Progress Notes (Signed)
Patient: Angela Thomas Female    DOB: July 24, 1961   58 y.o.   MRN: [id number] Visit Date: 02/10/2019  Today's Provider: [name], MD   No chief complaint on file.  Subjective:     HPI Virtual Visit via Video Note  I connected with Dayna Barker on 02/10/19 at  3:00 PM EDT by a video enabled telemedicine application and verified that I am speaking with the correct person using two identifiers.   I discussed the limitations of evaluation and management by telemedicine and the availability of in person appointments. The patient expressed understanding and agreed to proceed.  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  No known exposure to covert.  Causing with allergy symptoms and ear congestion with discomfort into her jaw and discomfort and  fullness in the maxillary sinuses.  She states she had a low-grade fever last night but nothing since then.  No chills myalgias and no cough at all.  Certainly no shortness of breath.    Allergies  Allergen Reactions  . Beef-Derived Products Anaphylaxis  . Other Anaphylaxis    Reaction to red meat and pork products  . Pork-Derived Products Anaphylaxis     Current Outpatient Medications:  .  ALLERGY RELIEF 180 MG tablet, TAKE 1 TABLET DAILY, Disp: 30 tablet, Rfl: 12 .  ALPRAZolam (XANAX) 0.25 MG tablet, Take 1 tablet (0.25 mg total) by mouth every 8 (eight) hours as needed for anxiety., Disp: 30 tablet, Rfl: 5 .  cycloSPORINE (RESTASIS) 0.05 % ophthalmic emulsion, Place 1 drop into both eyes every 12 (twelve) hours., Disp: , Rfl:  .  Diclofenac-miSOPROStol (ARTHROTEC) 75-0.2 MG TBEC, Take 1 tablet by mouth 2 (two) times daily., Disp: 60 tablet, Rfl: 12 .  Flaxseed, Linseed, (FLAX SEED OIL) 1000 MG CAPS, Take by mouth., Disp: , Rfl:  .  mesalamine (LIALDA) 1.2 g EC tablet, Take 1 tablet (1.2 g total) by mouth 2 (two) times daily., Disp: 366  tablet, Rfl: 0 .  valACYclovir (VALTREX) 500 MG tablet, Take 1 tablet (500 mg total) by mouth 2 (two) times daily as needed., Disp: 60 tablet, Rfl: 12  Review of Systems  Constitutional: Negative.   HENT: Positive for congestion and sinus pain.   Eyes: Positive for itching.  Respiratory: Negative.   Cardiovascular: Negative.   Endocrine: Negative.   Allergic/Immunologic: Negative.   Hematological: Negative.   Psychiatric/Behavioral: Negative.     Social History   Tobacco Use  . Smoking status: Never Smoker  . Smokeless tobacco: Never Used  Substance Use Topics  . Alcohol use: Yes    Alcohol/week: 1.0 standard drinks    Types: 1 Glasses of wine per week    Comment: occas      Objective:   There were no vitals taken for this visit. There were no vitals filed for this visit.   Physical Exam Constitutional:      Appearance: Normal appearance.  HENT:     Head: Normocephalic and atraumatic.     Right Ear: External ear normal.     Left Ear: External ear normal.     Nose: Nose normal.  Eyes:     General: No scleral icterus. Pulmonary:     Effort: Pulmonary effort is normal.  Neurological:     Mental Status: She is alert. Mental status is at baseline.     Cranial Nerves: No cranial nerve  deficit.  Psychiatric:        Mood and Affect: Mood normal.        Behavior: Behavior normal.        Thought Content: Thought content normal.        Judgment: Judgment normal.         Assessment & Plan     1. Seasonal allergic rhinitis due to pollen Add Flonase nasal spray.  I think most of this is allergies.  2. Viral upper respiratory tract infection Recent URI with no symptoms consistent with Covid.  Push fluids and try Tylenol for the discomfort.  She will let us know if she feels worse.  He knows to call if she gets a cough or shortness of breath or high fevers or myalgias  3. Acute non-recurrent maxillary sinusitis Augmentin called in just in case she gets worse with  the Easter weekend coming up.  Follow Up Instructions:    I discussed the assessment and treatment plan with the patient. The patient was provided an opportunity to ask questions and all were answered. The patient agreed with the plan and demonstrated an understanding of the instructions.   The patient was advised to call back or seek an in-person evaluation if the symptoms worsen or if the condition fails to improve as anticipated.  I provided 10 minutes of non-face-to-face time during this encounter.       Cranford Mon, MD  [facility]

## 2019-07-21 ENCOUNTER — Other Ambulatory Visit: Payer: Self-pay | Admitting: Family Medicine

## 2019-07-21 DIAGNOSIS — Z1231 Encounter for screening mammogram for malignant neoplasm of breast: Secondary | ICD-10-CM

## 2019-08-06 ENCOUNTER — Other Ambulatory Visit: Payer: Self-pay | Admitting: Gastroenterology

## 2019-08-06 DIAGNOSIS — K5100 Ulcerative (chronic) pancolitis without complications: Secondary | ICD-10-CM

## 2019-08-10 ENCOUNTER — Ambulatory Visit
Admission: RE | Admit: 2019-08-10 | Discharge: 2019-08-10 | Disposition: A | Discharge status: Home / Self Care | Payer: 59 | Source: Ambulatory Visit | Attending: Family Medicine | Admitting: Family Medicine

## 2019-08-10 ENCOUNTER — Other Ambulatory Visit: Payer: Self-pay | Admitting: Gastroenterology

## 2019-08-10 ENCOUNTER — Other Ambulatory Visit: Payer: Self-pay

## 2019-08-10 DIAGNOSIS — Z1231 Encounter for screening mammogram for malignant neoplasm of breast: Secondary | ICD-10-CM | POA: Insufficient documentation

## 2019-08-10 DIAGNOSIS — K5100 Ulcerative (chronic) pancolitis without complications: Secondary | ICD-10-CM

## 2019-08-10 MED ORDER — MESALAMINE 1.2 G PO TBEC: 1.2000 g | DELAYED_RELEASE_TABLET | Freq: Two times a day (BID) | ORAL | 0 refills | Status: DC

## 2019-08-10 NOTE — Telephone Encounter (Signed)
Patient called & has spoken with [facility] & needs a refill on her mesalamine (LIALDA) 1.2 g EC tablet .

## 2019-08-10 NOTE — Telephone Encounter (Signed)
Patient has appointment 09/20/2019 Last office visit 12/14/17 Ulcerative pancolitis without complication  Last refill 02/02/19 0 refills  Was supposed to follow up in 6 months from last appointment

## 2019-08-17 ENCOUNTER — Other Ambulatory Visit: Payer: Self-pay

## 2019-08-17 ENCOUNTER — Ambulatory Visit (INDEPENDENT_AMBULATORY_CARE_PROVIDER_SITE_OTHER): Payer: 59

## 2019-08-17 DIAGNOSIS — Z23 Encounter for immunization: Secondary | ICD-10-CM

## 2019-08-31 DIAGNOSIS — H5213 Myopia, bilateral: Secondary | ICD-10-CM | POA: Diagnosis not present

## 2019-09-20 ENCOUNTER — Ambulatory Visit (INDEPENDENT_AMBULATORY_CARE_PROVIDER_SITE_OTHER): Payer: 59 | Admitting: Gastroenterology

## 2019-09-20 DIAGNOSIS — K5100 Ulcerative (chronic) pancolitis without complications: Secondary | ICD-10-CM | POA: Diagnosis not present

## 2019-09-20 NOTE — Progress Notes (Signed)
Angela Sear, MD [address]  [place]  [place], [place] [address]  Main: [phone]  Fax: [phone]    Gastroenterology Consultation Video Visit  Referring Provider:     Jerrol Banana.,* Primary Care Physician:  Jerrol Banana., MD Primary Gastroenterologist:  Dr. Cephas Darby Reason for Consultation:    Ulcerative colitis        HPI:   Angela Thomas is a 58 y.o. female referred by Dr. Rosanna Randy, Retia Passe., MD  for consultation & management of ulcerative colitis  Virtual Visit Video Note  I connected with Angela Thomas on 09/20/19 at  9:30 AM EST by video and verified that I am speaking with the correct person using two identifiers.   I discussed the limitations, risks, security and privacy concerns of performing an evaluation and management service by video and the availability of in person appointments. I also discussed with the patient that there may be a patient responsible charge related to this service. The patient expressed understanding and agreed to proceed.  Location of the Patient: Home  Location of the provider: Office  Persons participating in the visit: Patient and provider only   History of Present Illness: Angela Thomas has history of ulcerative pancolitis, mild in severity who is currently maintained on Lialda 2.4 g daily, in clinical remission.  She decrease Lialda from 4.8 to 2.4 g daily since June 2019 and patient is doing very well from GI standpoint.  She denies any GI symptoms today including abdominal pain, diarrhea, rectal bleeding, weight loss.  She will be retiring in December, and her insurance will change probably to [facility].   NSAIDs: None  Antiplts/Anticoagulants/Anti thrombotics: None  GI Procedures: Colonoscopy 06/15/2017 Preparation of the colon was fair. Normal colonic mucosa. Normal terminal ileum. Random biopsies performed DIAGNOSIS:  A. RIGHT COLON; COLD BIOPSY:  - MILD CHRONIC ACTIVE COLITIS, SEE  COMMENT.   B. LEFT COLON; COLD BIOPSY:  - MILD CHRONIC ACTIVE COLITIS, SEE COMMENT.  Past Medical History:  Diagnosis Date  . Cancer [facility])    Endometrial  . Colitis   . Colitis   . DDD (degenerative disc disease), lumbar     Past Surgical History:  Procedure Laterality Date  . ABDOMINAL HYSTERECTOMY     total  . BREAST CYST ASPIRATION Bilateral    neg  . BUNIONECTOMY    . BUNIONECTOMY     With hammer toe repair  . COLONOSCOPY N/A 06/15/2017   Procedure: COLONOSCOPY;  Surgeon: Lin Landsman, MD;  Location: [facility] END[facility]COPY;  Service: Endoscopy;  Laterality: N/A;  . CYST REMOVAL NECK    . HAMMER TOE SURGERY    . TONSILLECTOMY AND ADENOIDECTOMY      Current Outpatient Medications:  .  ALLERGY RELIEF 180 MG tablet, TAKE 1 TABLET DAILY, Disp: 30 tablet, Rfl: 12 .  ALPRAZolam (XANAX) 0.25 MG tablet, Take 1 tablet (0.25 mg total) by mouth every 8 (eight) hours as needed for anxiety., Disp: 30 tablet, Rfl: 5 .  cycloSPORINE (RESTASIS) 0.05 % ophthalmic emulsion, Place 1 drop into both eyes every 12 (twelve) hours., Disp: , Rfl:  .  Diclofenac-miSOPROStol (ARTHROTEC) 75-0.2 MG TBEC, Take 1 tablet by mouth 2 (two) times daily., Disp: 60 tablet, Rfl: 12 .  fluticasone (VERAMYST) 27.5 MCG/SPRAY nasal spray, Place 2 sprays into the nose daily., Disp: 10 g, Rfl: 12 .  mesalamine (LIALDA) 1.2 g EC tablet, Take 1 tablet (1.2 g total) by mouth 2 (two)  times daily., Disp: 366 tablet, Rfl: 0 .  valACYclovir (VALTREX) 500 MG tablet, Take 1 tablet (500 mg total) by mouth 2 (two) times daily as needed., Disp: 60 tablet, Rfl: 12 .  amoxicillin-clavulanate (AUGMENTIN) 875-125 MG tablet, Take 1 tablet by mouth 2 (two) times daily. (Patient not taking: Reported on 09/20/2019), Disp: 20 tablet, Rfl: 0 .  Flaxseed, Linseed, (FLAX SEED OIL) 1000 MG CAPS, Take by mouth., Disp: , Rfl:  .  folic acid (FOLVITE) 1 MG tablet, Take by mouth., Disp: , Rfl:  .  Lifitegrast 5 % SOLN, Apply to eye., Disp: , Rfl:   .  XIIDRA 5 % SOLN, , Disp: , Rfl:    Family History  Problem Relation Age of Onset  . COPD Mother   . COPD Father   . Diabetes Father   . Pulmonary fibrosis Father   . Bladder Cancer Father   . Breast cancer Maternal Aunt   . Dementia Maternal Aunt   . Transient ischemic attack Maternal Uncle   . Heart disease Maternal Uncle   . Cancer Maternal Uncle        laryngeal  . Heart disease Maternal Grandfather   . Diabetes Paternal Grandfather   . Prostate cancer Paternal Grandfather   . Other Paternal Grandmother        cerebral hemorrhage  . Ovarian cancer Neg Hx      Social History   Tobacco Use  . Smoking status: Never Smoker  . Smokeless tobacco: Never Used  Substance Use Topics  . Alcohol use: Yes    Alcohol/week: 1.0 standard drinks    Types: 1 Glasses of wine per week    Comment: occas  . Drug use: No    Allergies as of 09/20/2019 - Review Complete 09/20/2019  Allergen Reaction Noted  . Beef-derived products Anaphylaxis 04/11/2015  . Other Anaphylaxis 04/12/2015  . Pork-derived products Anaphylaxis 04/11/2015     Imaging Studies: Reviewed  Assessment and Plan:   Angela Thomas is a 58 y.o. female with mild ulcerative pancolitis, currently in clinical remission, maintained on mesalamine 2.4 g daily.  Recommend to check fecal calprotectin levels.  If elevated, recommend colonoscopy to assess mucosal response.  She has physical with Dr. Rosanna Randy in December, she will get labs at that time.  I do not recommend fecal occult blood test anymore given that she has been receiving colonoscopy for colon cancer screening   Follow Up Instructions:   I discussed the assessment and treatment plan with the patient. The patient was provided an opportunity to ask questions and all were answered. The patient agreed with the plan and demonstrated an understanding of the instructions.   The patient was advised to call back or seek an in-person evaluation if the symptoms  worsen or if the condition fails to improve as anticipated.  I provided 15 minutes of face-to-face time during this encounter.   Follow up in 6 months   Cephas Darby, MD

## 2019-09-22 DIAGNOSIS — K5100 Ulcerative (chronic) pancolitis without complications: Secondary | ICD-10-CM | POA: Diagnosis not present

## 2019-09-27 LAB — CALPROTECTIN, FECAL: Calprotectin, Fecal: 22 ug/g (ref 0–120)

## 2019-09-30 ENCOUNTER — Other Ambulatory Visit: Payer: Self-pay | Admitting: Family Medicine

## 2019-10-18 NOTE — Progress Notes (Signed)
Patient: Angela Thomas, Female    DOB: 12-13-1960, 58 y.o.   MRN: [id number] Visit Date: 10/19/2019  Today's Provider: [name], MD   Chief Complaint  Patient presents with  . Annual Exam   Subjective:     Annual physical exam Angela Thomas is a 58 y.o. female who presents today for health maintenance and complete physical. She feels well. She reports exercising yes/walking. She reports she is sleeping well.  -----------------------------------------------------------   Colonoscopy: 06/15/2017 Mammogram: 08/10/2019 Pap: 07/21/2018 Normal  Review of Systems  Constitutional: Negative.   HENT: Negative.   Eyes: Negative.   Respiratory: Negative.   Cardiovascular: Negative.   Gastrointestinal: Negative.   Endocrine: Negative.   Genitourinary: Negative.   Allergic/Immunologic: Negative.   Neurological: Negative.   Hematological: Negative.   Psychiatric/Behavioral: Negative.     Social History      She  reports that she has never smoked. She has never used smokeless tobacco. She reports current alcohol use of about 1.0 standard drinks of alcohol per week. She reports that she does not use drugs.  She is retiring from [employer] next month.       Social History   Socioeconomic History  . Marital status: Divorced    Spouse name: Not on file  . Number of children: Not on file  . Years of education: Not on file  . Highest education level: Not on file  Occupational History  . Not on file  Tobacco Use  . Smoking status: Never Smoker  . Smokeless tobacco: Never Used  Substance and Sexual Activity  . Alcohol use: Yes    Alcohol/week: 1.0 standard drinks    Types: 1 Glasses of wine per week    Comment: occas  . Drug use: No  . Sexual activity: Not Currently  Other Topics Concern  . Not on file  Social History Narrative  . Not on file   Social Determinants of Health   Financial Resource Strain:   . Difficulty of Paying Living Expenses: Not on  file  Food Insecurity:   . Worried About Charity fundraiser in the Last Year: Not on file  . Ran Out of Food in the Last Year: Not on file  Transportation Needs:   . Lack of Transportation (Medical): Not on file  . Lack of Transportation (Non-Medical): Not on file  Physical Activity:   . Days of Exercise per Week: Not on file  . Minutes of Exercise per Session: Not on file  Stress:   . Feeling of Stress : Not on file  Social Connections:   . Frequency of Communication with Friends and Family: Not on file  . Frequency of Social Gatherings with Friends and Family: Not on file  . Attends Religious Services: Not on file  . Active Member of Clubs or Organizations: Not on file  . Attends Archivist Meetings: Not on file  . Marital Status: Not on file    Past Medical History:  Diagnosis Date  . Cancer [facility])    Endometrial  . Colitis   . Colitis   . DDD (degenerative disc disease), lumbar      Patient Active Problem List   Diagnosis Date Noted  . Ulcerative colitis ([place]) 07/13/2017  . Corneal scar and opacity 07/08/2017  . Presbyopia 07/08/2017  . Myopia 07/08/2017  . Hematochezia 06/14/2017  . Other fatigue 06/18/2016  . Vaginal atrophy 06/18/2016  . DDD (degenerative disc disease), lumbar 04/12/2015  .  Vitamin D deficiency 04/12/2015  . Obesity 04/12/2015  . Acute anxiety 04/12/2015  . Allergic rhinitis 04/12/2015  . Fibrocystic breast disease 04/12/2015  . Hyperlipidemia 02/18/2013  . Arthritis 02/18/2013  . Cancer of endometrium ([place]) 08/02/2012    Past Surgical History:  Procedure Laterality Date  . ABDOMINAL HYSTERECTOMY     total  . BREAST CYST ASPIRATION Bilateral    neg  . BUNIONECTOMY    . BUNIONECTOMY     With hammer toe repair  . COLONOSCOPY N/A 06/15/2017   Procedure: COLONOSCOPY;  Surgeon: Lin Landsman, MD;  Location: [facility] ENDOSCOPY;  Service: Endoscopy;  Laterality: N/A;  . CYST REMOVAL NECK    . HAMMER TOE SURGERY    .  TONSILLECTOMY AND ADENOIDECTOMY      Family History        Family Status  Relation Name Status  . Mother  Deceased at age 37  . Father  Deceased at age 19  . Sister  Alive  . Sister  Alive  . Sister  Alive  . Daughter  Alive  . Mat Aunt  Deceased  . Mat Uncle  (Not Specified)  . MGF  (Not Specified)  . PGF  (Not Specified)  . PGM  (Not Specified)  . Neg Hx  (Not Specified)        Her family history includes Bladder Cancer in her father; Breast cancer in her maternal aunt; COPD in her father and mother; Cancer in her maternal uncle; Dementia in her maternal aunt; Diabetes in her father and paternal grandfather; Heart disease in her maternal grandfather and maternal uncle; Other in her paternal grandmother; Prostate cancer in her paternal grandfather; Pulmonary fibrosis in her father; Transient ischemic attack in her maternal uncle. There is no history of Ovarian cancer.      Allergies  Allergen Reactions  . Beef-Derived Products Anaphylaxis  . Other Anaphylaxis    Reaction to red meat and pork products  . Pork-Derived Products Anaphylaxis     Current Outpatient Medications:  .  ALLERGY RELIEF 180 MG tablet, TAKE ONE TABLET EVERY DAY, Disp: 30 tablet, Rfl: 2 .  ALPRAZolam (XANAX) 0.25 MG tablet, Take 1 tablet (0.25 mg total) by mouth every 8 (eight) hours as needed for anxiety., Disp: 30 tablet, Rfl: 5 .  cycloSPORINE (RESTASIS) 0.05 % ophthalmic emulsion, Place 1 drop into both eyes every 12 (twelve) hours., Disp: , Rfl:  .  Diclofenac-miSOPROStol (ARTHROTEC) 75-0.2 MG TBEC, Take 1 tablet by mouth 2 (two) times daily., Disp: 60 tablet, Rfl: 12 .  fluticasone (VERAMYST) 27.5 MCG/SPRAY nasal spray, Place 2 sprays into the nose daily., Disp: 10 g, Rfl: 12 .  mesalamine (LIALDA) 1.2 g EC tablet, Take 1 tablet (1.2 g total) by mouth 2 (two) times daily., Disp: 366 tablet, Rfl: 0 .  valACYclovir (VALTREX) 500 MG tablet, Take 1 tablet (500 mg total) by mouth 2 (two) times daily as  needed., Disp: 60 tablet, Rfl: 12 .  amoxicillin-clavulanate (AUGMENTIN) 875-125 MG tablet, Take 1 tablet by mouth 2 (two) times daily. (Patient not taking: Reported on 09/20/2019), Disp: 20 tablet, Rfl: 0 .  Flaxseed, Linseed, (FLAX SEED OIL) 1000 MG CAPS, Take by mouth., Disp: , Rfl:  .  folic acid (FOLVITE) 1 MG tablet, Take by mouth., Disp: , Rfl:  .  Lifitegrast 5 % SOLN, Apply to eye., Disp: , Rfl:  .  XIIDRA 5 % SOLN, , Disp: , Rfl:    Patient Care Team: Jerrol Banana.,  MD as PCP - General (Family Medicine)    Objective:    Vitals: BP 128/76 (BP Location: Right Arm, Patient Position: Sitting, Cuff Size: Large)   Pulse 66   Temp 97.7 F (36.5 C) (Other (Comment))   Resp 18   Ht 5' 1"  (1.549 m)   Wt 185 lb (83.9 kg)   SpO2 97%   BMI 34.96 kg/m    Vitals:   10/19/19 0945  BP: 128/76  Pulse: 66  Resp: 18  Temp: 97.7 F (36.5 C)  TempSrc: Other (Comment)  SpO2: 97%  Weight: 185 lb (83.9 kg)  Height: 5' 1"  (1.549 m)     Physical Exam Vitals reviewed.  Constitutional:      Appearance: She is well-developed.  HENT:     Head: Normocephalic and atraumatic.     Right Ear: External ear normal.     Left Ear: External ear normal.     Nose: Nose normal.  Eyes:     Conjunctiva/sclera: Conjunctivae normal.     Pupils: Pupils are equal, round, and reactive to light.  Cardiovascular:     Rate and Rhythm: Normal rate and regular rhythm.     Heart sounds: Normal heart sounds.  Pulmonary:     Effort: Pulmonary effort is normal.     Breath sounds: Normal breath sounds.  Abdominal:     General: Bowel sounds are normal.     Palpations: Abdomen is soft.  Musculoskeletal:        General: Normal range of motion.     Cervical back: Normal range of motion and neck supple.  Skin:    General: Skin is warm and dry.  Neurological:     Mental Status: She is alert and oriented to person, place, and time.  Psychiatric:        Behavior: Behavior normal.        Thought  Content: Thought content normal.        Judgment: Judgment normal.      Depression Screen PHQ 2/9 Scores 08/18/2018 08/12/2017 08/06/2016  PHQ - 2 Score 0 0 0  PHQ- 9 Score - 0 -       Assessment & Plan:     Routine Health Maintenance and Physical Exam  Exercise Activities and Dietary recommendations Goals   None     Immunization History  Administered Date(s) Administered  . Influenza,inj,Quad PF,6+ Mos 08/17/2019  . Influenza-Unspecified 09/03/2015, 08/03/2017  . Tdap 12/09/2012    Health Maintenance  Topic Date Due  . HIV Screening  11/23/1973  . PAP SMEAR-Modifier  07/21/2021  . MAMMOGRAM  08/09/2021  . TETANUS/TDAP  12/09/2022  . COLONOSCOPY  06/16/2027  . INFLUENZA VACCINE  Completed  . Hepatitis C Screening  Completed     Discussed health benefits of physical activity, and encouraged her to engage in regular exercise appropriate for her age and condition.    -------------------------------------------------------------------- 1. Annual physical exam Well woman exam per Dr. Marcelline Mates Cologuard per Dr. Marius Ditch - TSH - CBC w/Diff/Platelet - Comprehensive Metabolic Panel (CMET) - Lipid panel  2. Hyperlipidemia, unspecified hyperlipidemia type  - TSH - CBC w/Diff/Platelet - Comprehensive Metabolic Panel (CMET) - Lipid panel  3. Ulcerative pancolitis without complication ([place]) Cologuard per Dr. Marius Ditch  4. Cancer of endometrium [facility]) Status post hysterectomy  5. Allergic reaction to alpha-gal She avoids eating any four-legged animals.  6. Class 1 obesity due to excess calories without serious comorbidity with body mass index (BMI) of 34.0 to 34.9 in adult Diet  and exercise.    Follow up in one year for CPE.    I,Marcee Jacobs,acting as a scribe for [name], MD.,have documented all relevant documentation on the behalf of [name], MD,as directed by  [name], MD while in the presence of [name],  MD.    [name], MD  [facility]

## 2019-10-19 ENCOUNTER — Encounter: Payer: Self-pay | Admitting: Family Medicine

## 2019-10-19 ENCOUNTER — Ambulatory Visit (INDEPENDENT_AMBULATORY_CARE_PROVIDER_SITE_OTHER): Payer: 59 | Admitting: Family Medicine

## 2019-10-19 ENCOUNTER — Other Ambulatory Visit: Payer: Self-pay

## 2019-10-19 VITALS — BP 128/76 | HR 66 | Temp 97.7°F | Resp 18 | Ht 61.0 in | Wt 185.0 lb

## 2019-10-19 DIAGNOSIS — Z6834 Body mass index (BMI) 34.0-34.9, adult: Secondary | ICD-10-CM | POA: Diagnosis not present

## 2019-10-19 DIAGNOSIS — K5100 Ulcerative (chronic) pancolitis without complications: Secondary | ICD-10-CM

## 2019-10-19 DIAGNOSIS — Z0000 Encounter for general adult medical examination without abnormal findings: Secondary | ICD-10-CM | POA: Diagnosis not present

## 2019-10-19 DIAGNOSIS — T781XXA Other adverse food reactions, not elsewhere classified, initial encounter: Secondary | ICD-10-CM | POA: Diagnosis not present

## 2019-10-19 DIAGNOSIS — E6609 Other obesity due to excess calories: Secondary | ICD-10-CM | POA: Diagnosis not present

## 2019-10-19 DIAGNOSIS — C541 Malignant neoplasm of endometrium: Secondary | ICD-10-CM

## 2019-10-19 DIAGNOSIS — E785 Hyperlipidemia, unspecified: Secondary | ICD-10-CM | POA: Diagnosis not present

## 2019-10-20 DIAGNOSIS — Z0000 Encounter for general adult medical examination without abnormal findings: Secondary | ICD-10-CM | POA: Diagnosis not present

## 2019-10-20 DIAGNOSIS — E785 Hyperlipidemia, unspecified: Secondary | ICD-10-CM | POA: Diagnosis not present

## 2019-10-21 ENCOUNTER — Ambulatory Visit (INDEPENDENT_AMBULATORY_CARE_PROVIDER_SITE_OTHER): Payer: 59 | Admitting: Obstetrics and Gynecology

## 2019-10-21 ENCOUNTER — Encounter: Payer: Self-pay | Admitting: Obstetrics and Gynecology

## 2019-10-21 ENCOUNTER — Other Ambulatory Visit: Payer: Self-pay

## 2019-10-21 VITALS — BP 118/76 | HR 73 | Ht 61.0 in | Wt 185.6 lb

## 2019-10-21 DIAGNOSIS — Z1231 Encounter for screening mammogram for malignant neoplasm of breast: Secondary | ICD-10-CM | POA: Diagnosis not present

## 2019-10-21 DIAGNOSIS — Z8542 Personal history of malignant neoplasm of other parts of uterus: Secondary | ICD-10-CM

## 2019-10-21 DIAGNOSIS — Z01419 Encounter for gynecological examination (general) (routine) without abnormal findings: Secondary | ICD-10-CM

## 2019-10-21 DIAGNOSIS — N952 Postmenopausal atrophic vaginitis: Secondary | ICD-10-CM

## 2019-10-21 DIAGNOSIS — K5100 Ulcerative (chronic) pancolitis without complications: Secondary | ICD-10-CM

## 2019-10-21 DIAGNOSIS — R399 Unspecified symptoms and signs involving the genitourinary system: Secondary | ICD-10-CM | POA: Diagnosis not present

## 2019-10-21 DIAGNOSIS — Z6835 Body mass index (BMI) 35.0-35.9, adult: Secondary | ICD-10-CM | POA: Diagnosis not present

## 2019-10-21 LAB — POCT URINALYSIS DIPSTICK
Bilirubin, UA: NEGATIVE
Blood, UA: NEGATIVE
Glucose, UA: NEGATIVE
Ketones, UA: NEGATIVE
Leukocytes, UA: NEGATIVE
Nitrite, UA: NEGATIVE
Protein, UA: NEGATIVE
Spec Grav, UA: 1.01 (ref 1.010–1.025)
Urobilinogen, UA: 0.2 E.U./dL
pH, UA: 7 (ref 5.0–8.0)

## 2019-10-21 LAB — CBC WITH DIFFERENTIAL/PLATELET
Basophils Absolute: 0 10*3/uL (ref 0.0–0.2)
Basos: 0 %
EOS (ABSOLUTE): 0.1 10*3/uL (ref 0.0–0.4)
Eos: 2 %
Hematocrit: 40.4 % (ref 34.0–46.6)
Hemoglobin: 13.3 g/dL (ref 11.1–15.9)
Immature Grans (Abs): 0 10*3/uL (ref 0.0–0.1)
Immature Granulocytes: 0 %
Lymphocytes Absolute: 1.5 10*3/uL (ref 0.7–3.1)
Lymphs: 25 %
MCH: 30.1 pg (ref 26.6–33.0)
MCHC: 32.9 g/dL (ref 31.5–35.7)
MCV: 91 fL (ref 79–97)
Monocytes Absolute: 0.4 10*3/uL (ref 0.1–0.9)
Monocytes: 6 %
Neutrophils Absolute: 4.1 10*3/uL (ref 1.4–7.0)
Neutrophils: 67 %
Platelets: 318 10*3/uL (ref 150–450)
RBC: 4.42 x10E6/uL (ref 3.77–5.28)
RDW: 12.2 % (ref 11.7–15.4)
WBC: 6 10*3/uL (ref 3.4–10.8)

## 2019-10-21 LAB — COMPREHENSIVE METABOLIC PANEL
ALT: 38 IU/L — ABNORMAL HIGH (ref 0–32)
AST: 27 IU/L (ref 0–40)
Albumin/Globulin Ratio: 1.7 (ref 1.2–2.2)
Albumin: 4.3 g/dL (ref 3.8–4.9)
Alkaline Phosphatase: 104 IU/L (ref 39–117)
BUN/Creatinine Ratio: 24 (ref 12–28)
BUN: 15 mg/dL (ref 8–27)
Bilirubin Total: 0.2 mg/dL (ref 0.0–1.2)
CO2: 26 mmol/L (ref 20–29)
Calcium: 9.6 mg/dL (ref 8.7–10.3)
Chloride: 101 mmol/L (ref 96–106)
Creatinine, Ser: 0.63 mg/dL (ref 0.57–1.00)
GFR calc Af Amer: 113 mL/min/{1.73_m2} (ref >59)
GFR calc non Af Amer: 98 mL/min/{1.73_m2} (ref >59)
Globulin, Total: 2.5 g/dL (ref 1.5–4.5)
Glucose: 97 mg/dL (ref 65–99)
Potassium: 4.3 mmol/L (ref 3.5–5.2)
Sodium: 141 mmol/L (ref 134–144)
Total Protein: 6.8 g/dL (ref 6.0–8.5)

## 2019-10-21 LAB — TSH: TSH: 1.68 u[IU]/mL (ref 0.450–4.500)

## 2019-10-21 LAB — LIPID PANEL
Chol/HDL Ratio: 4 ratio (ref 0.0–4.4)
Cholesterol, Total: 232 mg/dL — ABNORMAL HIGH (ref 100–199)
HDL: 58 mg/dL (ref >39)
LDL Chol Calc (NIH): 159 mg/dL — ABNORMAL HIGH (ref 0–99)
Triglycerides: 85 mg/dL (ref 0–149)
VLDL Cholesterol Cal: 15 mg/dL (ref 5–40)

## 2019-10-21 NOTE — Progress Notes (Signed)
Pt is present for annual exam. Pt stated that she was doing well. Flu vaccine completed 08/17/19.

## 2019-10-21 NOTE — Patient Instructions (Addendum)
Health Maintenance for Postmenopausal Women Menopause is a normal process in which your ability to get pregnant comes to an end. This process happens slowly over many months or years, usually between the ages of 53 and 86. Menopause is complete when you have missed your menstrual periods for 12 months. It is important to talk with your health care provider about some of the most common conditions that affect women after menopause (postmenopausal women). These include heart disease, cancer, and bone loss (osteoporosis). Adopting a healthy lifestyle and getting preventive care can help to promote your health and wellness. The actions you take can also lower your chances of developing some of these common conditions. What should I know about menopause? During menopause, you may get a number of symptoms, such as:  Hot flashes. These can be moderate or severe.  Night sweats.  Decrease in sex drive.  Mood swings.  Headaches.  Tiredness.  Irritability.  Memory problems.  Insomnia. Choosing to treat or not to treat these symptoms is a decision that you make with your health care provider. Do I need hormone replacement therapy?  Hormone replacement therapy is effective in treating symptoms that are caused by menopause, such as hot flashes and night sweats.  Hormone replacement carries certain risks, especially as you become older. If you are thinking about using estrogen or estrogen with progestin, discuss the benefits and risks with your health care provider. What is my risk for heart disease and stroke? The risk of heart disease, heart attack, and stroke increases as you age. One of the causes may be a change in the body's hormones during menopause. This can affect how your body uses dietary fats, triglycerides, and cholesterol. Heart attack and stroke are medical emergencies. There are many things that you can do to help prevent heart disease and stroke. Watch your blood pressure  High  blood pressure causes heart disease and increases the risk of stroke. This is more likely to develop in people who have high blood pressure readings, are of African descent, or are overweight.  Have your blood pressure checked: ? Every 3-5 years if you are 55-59 years of age. ? Every year if you are 77 years old or older. Eat a healthy diet   Eat a diet that includes plenty of vegetables, fruits, low-fat dairy products, and lean protein.  Do not eat a lot of foods that are high in solid fats, added sugars, or sodium. Get regular exercise Get regular exercise. This is one of the most important things you can do for your health. Most adults should:  Try to exercise for at least 150 minutes each week. The exercise should increase your heart rate and make you sweat (moderate-intensity exercise).  Try to do strengthening exercises at least twice each week. Do these in addition to the moderate-intensity exercise.  Spend less time sitting. Even light physical activity can be beneficial. Other tips  Work with your health care provider to achieve or maintain a healthy weight.  Do not use any products that contain nicotine or tobacco, such as cigarettes, e-cigarettes, and chewing tobacco. If you need help quitting, ask your health care provider.  Know your numbers. Ask your health care provider to check your cholesterol and your blood sugar (glucose). Continue to have your blood tested as directed by your health care provider. Do I need screening for cancer? Depending on your health history and family history, you may need to have cancer screening at different stages of your life. This  may include screening for:  Breast cancer.  Cervical cancer.  Lung cancer.  Colorectal cancer. What is my risk for osteoporosis? After menopause, you may be at increased risk for osteoporosis. Osteoporosis is a condition in which bone destruction happens more quickly than new bone creation. To help prevent  osteoporosis or the bone fractures that can happen because of osteoporosis, you may take the following actions:  If you are 8-61 years old, get at least 1,000 mg of calcium and at least 600 mg of vitamin D per day.  If you are older than age 69 but younger than age 10, get at least 1,200 mg of calcium and at least 600 mg of vitamin D per day.  If you are older than age 52, get at least 1,200 mg of calcium and at least 800 mg of vitamin D per day. Smoking and drinking excessive alcohol increase the risk of osteoporosis. Eat foods that are rich in calcium and vitamin D, and do weight-bearing exercises several times each week as directed by your health care provider. How does menopause affect my mental health? Depression may occur at any age, but it is more common as you become older. Common symptoms of depression include:  Low or sad mood.  Changes in sleep patterns.  Changes in appetite or eating patterns.  Feeling an overall lack of motivation or enjoyment of activities that you previously enjoyed.  Frequent crying spells. Talk with your health care provider if you think that you are experiencing depression. General instructions See your health care provider for regular wellness exams and vaccines. This may include:  Scheduling regular health, dental, and eye exams.  Getting and maintaining your vaccines. These include: ? Influenza vaccine. Get this vaccine each year before the flu season begins. ? Pneumonia vaccine. ? Shingles vaccine. ? Tetanus, diphtheria, and pertussis (Tdap) booster vaccine. Your health care provider may also recommend other immunizations. Tell your health care provider if you have ever been abused or do not feel safe at home. Summary  Menopause is a normal process in which your ability to get pregnant comes to an end.  This condition causes hot flashes, night sweats, decreased interest in sex, mood swings, headaches, or lack of sleep.  Treatment for this  condition may include hormone replacement therapy.  Take actions to keep yourself healthy, including exercising regularly, eating a healthy diet, watching your weight, and checking your blood pressure and blood sugar levels.  Get screened for cancer and depression. Make sure that you are up to date with all your vaccines. This information is not intended to replace advice given to you by your health care provider. Make sure you discuss any questions you have with your health care provider. Document Released: 12/12/2005 Document Revised: 10/13/2018 Document Reviewed: 10/13/2018 Elsevier Patient Education  2020 [place] self-awareness is knowing how your breasts look and feel. Doing breast self-awareness is important. It allows you to catch a breast problem early while it is still small and can be treated. All women should do breast self-awareness, including women who have had breast implants. Tell your doctor if you notice a change in your breasts. What you need:  A mirror.  A well-lit room. How to do a breast self-exam A breast self-exam is one way to learn what is normal for your breasts and to check for changes. To do a breast self-exam: Look for changes  1. Take off all the clothes above your waist. 2. Stand in  front of a mirror in a room with good lighting. 3. Put your hands on your hips. 4. Push your hands down. 5. Look at your breasts and nipples in the mirror to see if one breast or nipple looks different from the other. Check to see if: ? The shape of one breast is different. ? The size of one breast is different. ? There are wrinkles, dips, and bumps in one breast and not the other. 6. Look at each breast for changes in the skin, such as: ? Redness. ? Scaly areas. 7. Look for changes in your nipples, such as: ? Liquid around the nipples. ? Bleeding. ? Dimpling. ? Redness. ? A change in where the nipples are. Feel for  changes  1. Lie on your back on the floor. 2. Feel each breast. To do this, follow these steps: ? Pick a breast to feel. ? Put the arm closest to that breast above your head. ? Use your other arm to feel the nipple area of your breast. Feel the area with the pads of your three middle fingers by making small circles with your fingers. For the first circle, press lightly. For the second circle, press harder. For the third circle, press even harder. ? Keep making circles with your fingers at the different pressures as you move down your breast. Stop when you feel your ribs. ? Move your fingers a little toward the center of your body. ? Start making circles with your fingers again, this time going up until you reach your collarbone. ? Keep making up-and-down circles until you reach your armpit. Remember to keep using the three pressures. ? Feel the other breast in the same way. 3. Sit or stand in the tub or shower. 4. With soapy water on your skin, feel each breast the same way you did in step 2 when you were lying on the floor. Write down what you find Writing down what you find can help you remember what to tell your doctor. Write down:  What is normal for each breast.  Any changes you find in each breast, including: ? The kind of changes you find. ? Whether you have pain. ? Size and location of any lumps.  When you last had your menstrual period. General tips  Check your breasts every month.  If you are breastfeeding, the best time to check your breasts is after you feed your baby or after you use a breast pump.  If you get menstrual periods, the best time to check your breasts is 5-7 days after your menstrual period is over.  With time, you will become comfortable with the self-exam, and you will begin to know if there are changes in your breasts. Contact a doctor if you:  See a change in the shape or size of your breasts or nipples.  See a change in the skin of your breast or  nipples, such as red or scaly skin.  Have fluid coming from your nipples that is not normal.  Find a lump or thick area that was not there before.  Have pain in your breasts.  Have any concerns about your breast health. Summary  Breast self-awareness includes looking for changes in your breasts, as well as feeling for changes within your breasts.  Breast self-awareness should be done in front of a mirror in a well-lit room.  You should check your breasts every month. If you get menstrual periods, the best time to check your breasts is 5-7 days  after your menstrual period is over.  Let your doctor know of any changes you see in your breasts, including changes in size, changes on the skin, pain or tenderness, or fluid from your nipples that is not normal. This information is not intended to replace advice given to you by your health care provider. Make sure you discuss any questions you have with your health care provider. Document Released: 04/07/2008 Document Revised: 06/08/2018 Document Reviewed: 06/08/2018 Elsevier Patient Education  2020 [employer].

## 2019-10-21 NOTE — Progress Notes (Signed)
ANNUAL PREVENTATIVE CARE GYNECOLOGY  ENCOUNTER NOTE  Subjective:       Angela Thomas is a 58 y.o. [phone] female here for a routine annual gynecologic exam. She has a GYN history significant for endometrial cancer s/p robotic hysterectomy with BSO and lymph node disection in 2013 (Endometrioid adenocarcinoma with mucinous differentiation, grade 1).  The patient is not sexually active. The patient is not taking hormone replacement therapy. Patient denies post-menopausal vaginal bleeding. The patient wears seatbelts: yes. The patient participates in regular exercise: yes (walking 6 days per week).    Current complaints: 1.  Desires for her urine to be checked today. Overall asymptomatic, but notes a UTI was discovered incidentally last year when she had her urine checked despite no symptoms .    Gynecologic History No LMP recorded. Patient has had a hysterectomy. Contraception: post menopausal status and s/p hysterectomy Last Pap: 07/21/2018. Results were: normal Last mammogram: 08/2019. Results were: normal Last Colonoscopy: Had virtual colonoscopy 2 years ago, then had stool protein sampling last month, normal.  Last Dexa Scan: Denies, low risk.    Obstetric History OB History  Gravida Para Term Preterm AB Living  2 2 2     2   SAB TAB Ectopic Multiple Live Births          2    # Outcome Date GA Lbr Len/2nd Weight Sex Delivery Anes PTL Lv  2 Term 1987   8 lb 9.6 oz (3.901 kg) F Vag-Spont   LIV  1 Term 1985   8 lb 2.2 oz (3.692 kg) F Vag-Spont   LIV    Past Medical History:  Diagnosis Date  . Cancer [facility])    Endometrial  . Colitis   . Colitis   . DDD (degenerative disc disease), lumbar     Family History  Problem Relation Age of Onset  . COPD Mother   . COPD Father   . Diabetes Father   . Pulmonary fibrosis Father   . Bladder Cancer Father   . Breast cancer Maternal Aunt   . Dementia Maternal Aunt   . Transient ischemic attack Maternal Uncle   . Heart disease  Maternal Uncle   . Cancer Maternal Uncle        laryngeal  . Heart disease Maternal Grandfather   . Diabetes Paternal Grandfather   . Prostate cancer Paternal Grandfather   . Other Paternal Grandmother        cerebral hemorrhage  . Ovarian cancer Neg Hx     Past Surgical History:  Procedure Laterality Date  . ABDOMINAL HYSTERECTOMY     total  . BREAST CYST ASPIRATION Bilateral    neg  . BUNIONECTOMY    . BUNIONECTOMY     With hammer toe repair  . COLONOSCOPY N/A 06/15/2017   Procedure: COLONOSCOPY;  Surgeon: Lin Landsman, MD;  Location: [facility] ENDOSCOPY;  Service: Endoscopy;  Laterality: N/A;  . CYST REMOVAL NECK    . HAMMER TOE SURGERY    . TONSILLECTOMY AND ADENOIDECTOMY      Social History   Socioeconomic History  . Marital status: Divorced    Spouse name: Not on file  . Number of children: Not on file  . Years of education: Not on file  . Highest education level: Not on file  Occupational History  . Not on file  Tobacco Use  . Smoking status: Never Smoker  . Smokeless tobacco: Never Used  Substance and Sexual Activity  . Alcohol use: Yes  Alcohol/week: 1.0 standard drinks    Types: 1 Glasses of wine per week    Comment: occas  . Drug use: No  . Sexual activity: Not Currently  Other Topics Concern  . Not on file  Social History Narrative  . Not on file   Social Determinants of Health   Financial Resource Strain:   . Difficulty of Paying Living Expenses: Not on file  Food Insecurity:   . Worried About Charity fundraiser in the Last Year: Not on file  . Ran Out of Food in the Last Year: Not on file  Transportation Needs:   . Lack of Transportation (Medical): Not on file  . Lack of Transportation (Non-Medical): Not on file  Physical Activity:   . Days of Exercise per Week: Not on file  . Minutes of Exercise per Session: Not on file  Stress:   . Feeling of Stress : Not on file  Social Connections:   . Frequency of Communication with Friends  and Family: Not on file  . Frequency of Social Gatherings with Friends and Family: Not on file  . Attends Religious Services: Not on file  . Active Member of Clubs or Organizations: Not on file  . Attends Archivist Meetings: Not on file  . Marital Status: Not on file  Intimate Partner Violence:   . Fear of Current or Ex-Partner: Not on file  . Emotionally Abused: Not on file  . Physically Abused: Not on file  . Sexually Abused: Not on file    Current Outpatient Medications on File Prior to Visit  Medication Sig Dispense Refill  . ALLERGY RELIEF 180 MG tablet TAKE ONE TABLET EVERY DAY 30 tablet 2  . ALPRAZolam (XANAX) 0.25 MG tablet Take 1 tablet (0.25 mg total) by mouth every 8 (eight) hours as needed for anxiety. 30 tablet 5  . cycloSPORINE (RESTASIS) 0.05 % ophthalmic emulsion Place 1 drop into both eyes every 12 (twelve) hours.    . Diclofenac-miSOPROStol (ARTHROTEC) 75-0.2 MG TBEC Take 1 tablet by mouth 2 (two) times daily. 60 tablet 12  . Flaxseed, Linseed, (FLAX SEED OIL) 1000 MG CAPS Take by mouth.    . fluticasone (VERAMYST) 27.5 MCG/SPRAY nasal spray Place 2 sprays into the nose daily. 10 g 12  . mesalamine (LIALDA) 1.2 g EC tablet Take 1 tablet (1.2 g total) by mouth 2 (two) times daily. 366 tablet 0  . valACYclovir (VALTREX) 500 MG tablet Take 1 tablet (500 mg total) by mouth 2 (two) times daily as needed. 60 tablet 12   No current facility-administered medications on file prior to visit.    Allergies  Allergen Reactions  . Beef-Derived Products Anaphylaxis  . Other Anaphylaxis    Reaction to red meat and pork products  . Pork-Derived Products Anaphylaxis      Review of Systems ROS Review of Systems - General ROS: negative for - chills, fatigue, fever, hot flashes, night sweats, weight gain or weight loss Psychological ROS: negative for - anxiety, decreased libido, depression, mood swings, physical abuse or sexual abuse Ophthalmic ROS: negative for -  blurry vision, eye pain or loss of vision ENT ROS: negative for - headaches, hearing change, visual changes or vocal changes Allergy and Immunology ROS: negative for - hives, itchy/watery eyes or seasonal allergies Hematological and Lymphatic ROS: negative for - bleeding problems, bruising, swollen lymph nodes or weight loss Endocrine ROS: negative for - galactorrhea, hair pattern changes, hot flashes, malaise/lethargy, mood swings, palpitations, polydipsia/polyuria, skin changes,  temperature intolerance or unexpected weight changes Breast ROS: negative for - new or changing breast lumps or nipple discharge Respiratory ROS: negative for - cough or shortness of breath Cardiovascular ROS: negative for - chest pain, irregular heartbeat, palpitations or shortness of breath Gastrointestinal ROS: no abdominal pain, change in bowel habits, or black or bloody stools Genito-Urinary ROS: no dysuria, trouble voiding, or hematuria Musculoskeletal ROS: negative for - joint pain or joint stiffness Neurological ROS: negative for - bowel and bladder control changes Dermatological ROS: negative for rash and skin lesion changes   Objective:   BP 118/76   Pulse 73   Ht 5' 1"  (1.549 m)   Wt 185 lb 9.6 oz (84.2 kg)   BMI 35.07 kg/m  CONSTITUTIONAL: Well-developed, well-nourished female in no acute distress.  PSYCHIATRIC: Normal mood and affect. Normal behavior. Normal judgment and thought content. [place]: Alert and oriented to person, place, and time. Normal muscle tone coordination. No cranial nerve deficit noted. HENT:  Normocephalic, atraumatic, External right and left ear normal. Oropharynx is clear and moist EYES: Conjunctivae and EOM are normal. Pupils are equal, round, and reactive to light. No scleral icterus.  NECK: Normal range of motion, supple, no masses.  Normal thyroid.  SKIN: Skin is warm and dry. No rash noted. Not diaphoretic. No erythema. No pallor. CARDIOVASCULAR: Normal heart rate  noted, regular rhythm, no murmur. RESPIRATORY: Clear to auscultation bilaterally. Effort and breath sounds normal, no problems with respiration noted. BREASTS: Symmetric in size. No masses, skin changes, nipple drainage, or lymphadenopathy. ABDOMEN: Soft, normal bowel sounds, no distention noted.  No tenderness, rebound or guarding.  BLADDER: Normal PELVIC:  Bladder no bladder distension noted  Urethra: normal appearing urethra with no masses, tenderness or lesions  Vulva: normal appearing vulva with no masses, tenderness or lesions  Vagina: mildly atrophic vagina with normal color and discharge, no lesions  Cervix: surgically absent  Uterus: surgically absent, vaginal cuff well healed  Adnexa: surgically absent bilateral  RV: External Exam NormaI, No Rectal Masses and Normal Sphincter tone  MUSCULOSKELETAL: Normal range of motion. No tenderness.  No cyanosis, clubbing, or edema.  2+ distal pulses. LYMPHATIC: No Axillary, Supraclavicular, or Inguinal Adenopathy.   Labs: Lab Results  Component Value Date   WBC 6.0 10/20/2019   HGB 13.3 10/20/2019   HCT 40.4 10/20/2019   MCV 91 10/20/2019   PLT 318 10/20/2019    Lab Results  Component Value Date   CREATININE 0.63 10/20/2019   BUN 15 10/20/2019   NA 141 10/20/2019   K 4.3 10/20/2019   CL 101 10/20/2019   CO2 26 10/20/2019    Lab Results  Component Value Date   ALT 38 (H) 10/20/2019   AST 27 10/20/2019   ALKPHOS 104 10/20/2019   BILITOT 0.2 10/20/2019    Lab Results  Component Value Date   CHOL 232 (H) 10/20/2019   HDL 58 10/20/2019   LDLCALC 159 (H) 10/20/2019   TRIG 85 10/20/2019   CHOLHDL 4.0 10/20/2019    Lab Results  Component Value Date   TSH 1.680 10/20/2019    No results found for: HGBA1C    Results for orders placed or performed in visit on 10/21/19  POCT Urinalysis Dipstick  Result Value Ref Range   Color, UA yellow    Clarity, UA clear    Glucose, UA Negative Negative   Bilirubin, UA neg     Ketones, UA neg    Spec Grav, UA 1.010 1.010 - 1.025  Blood, UA neg    pH, UA 7.0 5.0 - 8.0   Protein, UA Negative Negative   Urobilinogen, UA 0.2 0.2 or 1.0 E.U./dL   Nitrite, UA neg    Leukocytes, UA Negative Negative   Appearance yellow;clear    Odor      Assessment:   1. Encounter for well woman exam with routine gynecological exam   2. Breast cancer screening by mammogram   3. Urinary symptom or sign   4. History of endometrial cancer   5. BMI 35.0-35.9,adult   6. Vaginal atrophy   7. Ulcerative pancolitis without complication ([place])     Plan:  Pap: Not needed. Discussed that pap smears were no longer needed as patient has had a hysterectomy and had no cervical involvement of her endometrial cancer.  Mammogram: Up to date.  Stool Guaiac Testing:  Up to date.  Patient notes she had virtual colonoscopy 2 years ago. Had stool testing recently and was low-normal, so provider noted she did not have to have a physical colonoscopy. Continue routine screens due to ulcerative colitis.  Labs: Up to date. Discussed mild lipid elevations, encouraged continued exercise and low-cholesterol diet. Done by PCP.  Routine preventative health maintenance measures emphasized: Exercise/Diet/Weight control, Tobacco Warnings, Alcohol/Substance use risks, Stress Management and Peer Pressure Issues.  History of endometrial cancer, remains NED. Is down to yearly surveillance.  Up to date on flu vaccine.  Ulcerative colitis managed by GI.  UA today negative. Given reassurance.  Return to [place], MD  [facility]

## 2019-11-28 ENCOUNTER — Ambulatory Visit: Payer: 59 | Attending: Internal Medicine

## 2019-11-28 ENCOUNTER — Other Ambulatory Visit: Payer: 59

## 2019-11-28 DIAGNOSIS — Z20822 Contact with and (suspected) exposure to covid-19: Secondary | ICD-10-CM

## 2019-11-29 LAB — NOVEL CORONAVIRUS, NAA: SARS-CoV-2, NAA: NOT DETECTED

## 2019-12-23 DIAGNOSIS — M5416 Radiculopathy, lumbar region: Secondary | ICD-10-CM | POA: Diagnosis not present

## 2019-12-24 ENCOUNTER — Other Ambulatory Visit: Payer: Self-pay | Admitting: Family Medicine

## 2019-12-24 DIAGNOSIS — J301 Allergic rhinitis due to pollen: Secondary | ICD-10-CM

## 2019-12-24 NOTE — Telephone Encounter (Signed)
Requested medication (s) are due for refill today: yes  Requested medication (s) are on the active medication list:yes  Last refill: 12/05/19  Future visit scheduled: yes  Notes to clinic: no valid encounter within last 12 months    Requested Prescriptions  Pending Prescriptions Disp Refills   ALLERGY RELIEF 180 MG tablet [Pharmacy Med Name: ALLERGY RELIEF 180 MG TAB] 30 tablet 2    Sig: TAKE ONE TABLET EVERY DAY      Ear, Nose, and Throat:  Antihistamines Failed - 12/24/2019 10:52 AM      Failed - Valid encounter within last 12 months    Recent Outpatient Visits           2 months ago Annual physical exam   [facility] Jerrol Banana., MD   10 months ago Seasonal allergic rhinitis due to pollen   [facility] Rosanna Randy, Retia Passe., MD   1 year ago Acute non-recurrent maxillary sinusitis   [facility] Trinna Post, [place]   1 year ago Annual physical exam   [facility] Jerrol Banana., MD   1 year ago Acute non-recurrent pansinusitis   [facility] [place], Dionne Bucy, MD       Future Appointments             In 10 months Rubie Maid, MD [facility]

## 2019-12-28 DIAGNOSIS — M545 Low back pain: Secondary | ICD-10-CM | POA: Diagnosis not present

## 2019-12-30 DIAGNOSIS — M545 Low back pain: Secondary | ICD-10-CM | POA: Diagnosis not present

## 2020-01-02 DIAGNOSIS — M545 Low back pain: Secondary | ICD-10-CM | POA: Diagnosis not present

## 2020-01-06 ENCOUNTER — Other Ambulatory Visit: Payer: Self-pay | Admitting: Family Medicine

## 2020-01-06 DIAGNOSIS — B001 Herpesviral vesicular dermatitis: Secondary | ICD-10-CM

## 2020-01-06 DIAGNOSIS — F419 Anxiety disorder, unspecified: Secondary | ICD-10-CM

## 2020-01-06 DIAGNOSIS — M545 Low back pain: Secondary | ICD-10-CM | POA: Diagnosis not present

## 2020-01-06 NOTE — Telephone Encounter (Signed)
Requested medication (s) are due for refill today: yes  Requested medication (s) are on the active medication list: yes  Last refill:  09/06/18  Future visit scheduled: yes  Notes to clinic:  not delegated    Requested Prescriptions  Pending Prescriptions Disp Refills   ALPRAZolam (XANAX) 0.25 MG tablet [Pharmacy Med Name: ALPRAZOLAM 0.25 MG TAB] 30 tablet     Sig: TAKE ONE TABLET EVERY EIGHT HOURS AS NEEDED FOR ANXIETY      Not Delegated - Psychiatry:  Anxiolytics/Hypnotics Failed - 01/06/2020  1:09 PM      Failed - This refill cannot be delegated      Failed - Urine Drug Screen completed in last 360 days.      Failed - Valid encounter within last 6 months    Recent Outpatient Visits           2 months ago Annual physical exam   [facility] Jerrol Banana., MD   11 months ago Seasonal allergic rhinitis due to pollen   [facility] Rosanna Randy, Retia Passe., MD   1 year ago Acute non-recurrent maxillary sinusitis   [facility] Carles Collet M, [place]   1 year ago Annual physical exam   [facility] Jerrol Banana., MD   1 year ago Acute non-recurrent pansinusitis   [facility] [place], Dionne Bucy, MD       Future Appointments             In 9 months Rubie Maid, MD [facility]              valACYclovir (VALTREX) 500 MG tablet [Pharmacy Med Name: VALACYCLOVIR HCL 500 MG TAB] 60 tablet 12    Sig: TAKE ONE TABLET BY MOUTH TWICE DAILY AS NEEDED      Antimicrobials:  Antiviral Agents - Anti-Herpetic Failed - 01/06/2020  1:09 PM      Failed - Valid encounter within last 12 months    Recent Outpatient Visits           2 months ago Annual physical exam   [facility] Jerrol Banana., MD   11 months ago Seasonal allergic rhinitis due to pollen   [facility] Rosanna Randy, Retia Passe., MD   1 year ago Acute non-recurrent maxillary sinusitis    [facility] Trinna Post, [place]   1 year ago Annual physical exam   [facility] Jerrol Banana., MD   1 year ago Acute non-recurrent pansinusitis   [facility], Dionne Bucy, MD       Future Appointments             In 9 months Rubie Maid, MD [facility]

## 2020-01-11 ENCOUNTER — Other Ambulatory Visit: Payer: Self-pay | Admitting: Family Medicine

## 2020-01-11 DIAGNOSIS — T781XXA Other adverse food reactions, not elsewhere classified, initial encounter: Secondary | ICD-10-CM

## 2020-01-11 MED ORDER — EPINEPHRINE 0.3 MG/0.3ML IJ SOAJ: 0.3000 mg | Freq: Once | INTRAMUSCULAR | 2 refills | Status: AC

## 2020-01-11 NOTE — Telephone Encounter (Signed)
Requested medication (s) are due for refill today: n/a  Requested medication (s) are on the active medication list: No  Last refill:  Unknown  Future visit scheduled: n/a  Notes to clinic:  Requesting refill on Epinephrine. Not on medication list. Has expired.    There are no medications in this encounter.

## 2020-01-11 NOTE — Telephone Encounter (Signed)
Copied from [place] [id number]. Topic: Quick Communication - Rx Refill/Question >> Jan 11, 2020  8:43 AM Rainey Pines A wrote: Medication: EPINEPHrine 0.3 mg/0.3 mL IJ SOAJ injection (Patient completely out of medication)  Has the patient contacted their pharmacy? Yes (Agent: If no, request that the patient contact the pharmacy for the refill.) (Agent: If yes, when and what did the pharmacy advise?)Contact PCP  Preferred Pharmacy (with phone number or street name): [place], [place] - [address]  Phone:  [phone] Fax:  [phone]     Agent: Please be advised that RX refills may take up to 3 business days. We ask that you follow-up with your pharmacy.

## 2020-01-16 ENCOUNTER — Other Ambulatory Visit: Payer: Self-pay | Admitting: Family Medicine

## 2020-01-16 DIAGNOSIS — M545 Low back pain: Secondary | ICD-10-CM | POA: Diagnosis not present

## 2020-01-16 DIAGNOSIS — M1990 Unspecified osteoarthritis, unspecified site: Secondary | ICD-10-CM

## 2020-01-16 NOTE — Telephone Encounter (Signed)
Requested medication (s) are due for refill today: yes  Requested medication (s) are on the active medication list: yes  Last refill:  11/14/19  Future visit scheduled: yes  Notes to clinic:  not delegated   Requested Prescriptions  Pending Prescriptions Disp Refills   Diclofenac-miSOPROStol 75-0.2 MG TBEC [Pharmacy Med Name: DICLOFENAC-MISOPROSTOL 75-0.2 MG DR] 60 tablet 12    Sig: TAKE 1 TABLET BY MOUTH TWICE DAILY      Not Delegated - Analgesics:  Antirheumatic Agents - diclofenac/misoprostol Failed - 01/16/2020 11:09 AM      Failed - This refill cannot be delegated      Failed - Valid encounter within last 12 months    Recent Outpatient Visits           2 months ago Annual physical exam   [facility] Jerrol Banana., MD   11 months ago Seasonal allergic rhinitis due to pollen   [facility] Rosanna Randy, Retia Passe., MD   1 year ago Acute non-recurrent maxillary sinusitis   [facility] Trinna Post, [place]   1 year ago Annual physical exam   [facility] Jerrol Banana., MD   1 year ago Acute non-recurrent pansinusitis   [facility] [place], Dionne Bucy, MD       Future Appointments             In 9 months Rubie Maid, MD [facility] - Cr in normal range and within 360 days    Creatinine  Date Value Ref Range Status  07/22/2012 0.64 0.60 - 1.30 mg/dL Final   Creatinine, Ser  Date Value Ref Range Status  10/20/2019 0.63 0.57 - 1.00 mg/dL Final          Passed - HGB in normal range and within 360 days    Hemoglobin  Date Value Ref Range Status  10/20/2019 13.3 11.1 - 15.9 g/dL Final          Passed - Patient is not pregnant

## 2020-01-25 ENCOUNTER — Other Ambulatory Visit: Payer: Self-pay | Admitting: Gastroenterology

## 2020-01-25 DIAGNOSIS — K5100 Ulcerative (chronic) pancolitis without complications: Secondary | ICD-10-CM

## 2020-01-25 NOTE — Telephone Encounter (Signed)
Last refill 08/10/2019 0 refills  Last office visit 09/20/2019 No appointment is scheduled

## 2020-01-27 DIAGNOSIS — M545 Low back pain: Secondary | ICD-10-CM | POA: Diagnosis not present

## 2020-02-01 DIAGNOSIS — M545 Low back pain: Secondary | ICD-10-CM | POA: Diagnosis not present

## 2020-02-06 DIAGNOSIS — M545 Low back pain: Secondary | ICD-10-CM | POA: Diagnosis not present

## 2020-02-08 DIAGNOSIS — M545 Low back pain: Secondary | ICD-10-CM | POA: Diagnosis not present

## 2020-02-13 DIAGNOSIS — M545 Low back pain: Secondary | ICD-10-CM | POA: Diagnosis not present

## 2020-02-20 DIAGNOSIS — M545 Low back pain: Secondary | ICD-10-CM | POA: Diagnosis not present

## 2020-02-27 DIAGNOSIS — M545 Low back pain: Secondary | ICD-10-CM | POA: Diagnosis not present

## 2020-03-02 NOTE — Progress Notes (Deleted)
     Established patient visit   Patient: Angela Thomas   DOB: [birth year]   59 y.o. Female  MRN: [id number] Visit Date: 03/07/2020  Today's healthcare provider: [name], MD   No chief complaint on file.  Subjective    HPI  Patient presents for heart palpitations.   {Show patient history (optional):23778::" "}   Medications: Outpatient Medications Prior to Visit  Medication Sig  . ALLERGY RELIEF 180 MG tablet TAKE ONE TABLET EVERY DAY  . ALPRAZolam (XANAX) 0.25 MG tablet TAKE ONE TABLET EVERY EIGHT HOURS AS NEEDED FOR ANXIETY  . cycloSPORINE (RESTASIS) 0.05 % ophthalmic emulsion Place 1 drop into both eyes every 12 (twelve) hours.  . Diclofenac-miSOPROStol 75-0.2 MG TBEC TAKE 1 TABLET BY MOUTH TWICE DAILY  . Flaxseed, Linseed, (FLAX SEED OIL) 1000 MG CAPS Take by mouth.  . fluticasone (VERAMYST) 27.5 MCG/SPRAY nasal spray Place 2 sprays into the nose daily.  . mesalamine (LIALDA) 1.2 g EC tablet TAKE ONE TABLET TWICE DAILY  . valACYclovir (VALTREX) 500 MG tablet TAKE ONE TABLET BY MOUTH TWICE DAILY AS NEEDED   No facility-administered medications prior to visit.    Review of Systems  {Show previous labs (optional):23779::" "}  Objective    There were no vitals taken for this visit. {Show previous vital signs (optional):23777::" "}  Physical Exam  ***  No results found for any visits on 03/07/20.  Assessment & Plan     ***  No follow-ups on file.      {provider attestation***:1}   [name], MD  [facility] [phone] (phone) [phone] (fax)  [place]

## 2020-03-04 ENCOUNTER — Encounter (HOSPITAL_COMMUNITY): Payer: Self-pay | Admitting: Emergency Medicine

## 2020-03-04 ENCOUNTER — Observation Stay (HOSPITAL_COMMUNITY)
Admission: EM | Admit: 2020-03-04 | Discharge: 2020-03-05 | Disposition: A | Discharge status: Home / Self Care | Payer: 59 | Attending: Student | Admitting: Student

## 2020-03-04 ENCOUNTER — Other Ambulatory Visit: Payer: Self-pay

## 2020-03-04 ENCOUNTER — Emergency Department (HOSPITAL_COMMUNITY): Payer: 59

## 2020-03-04 DIAGNOSIS — Z802 Family history of malignant neoplasm of other respiratory and intrathoracic organs: Secondary | ICD-10-CM | POA: Insufficient documentation

## 2020-03-04 DIAGNOSIS — R7303 Prediabetes: Secondary | ICD-10-CM | POA: Insufficient documentation

## 2020-03-04 DIAGNOSIS — Z20822 Contact with and (suspected) exposure to covid-19: Secondary | ICD-10-CM | POA: Diagnosis not present

## 2020-03-04 DIAGNOSIS — Z825 Family history of asthma and other chronic lower respiratory diseases: Secondary | ICD-10-CM | POA: Insufficient documentation

## 2020-03-04 DIAGNOSIS — I10 Essential (primary) hypertension: Secondary | ICD-10-CM | POA: Insufficient documentation

## 2020-03-04 DIAGNOSIS — Z8542 Personal history of malignant neoplasm of other parts of uterus: Secondary | ICD-10-CM | POA: Diagnosis not present

## 2020-03-04 DIAGNOSIS — Z8489 Family history of other specified conditions: Secondary | ICD-10-CM | POA: Diagnosis not present

## 2020-03-04 DIAGNOSIS — Z79899 Other long term (current) drug therapy: Secondary | ICD-10-CM | POA: Insufficient documentation

## 2020-03-04 DIAGNOSIS — Z8042 Family history of malignant neoplasm of prostate: Secondary | ICD-10-CM | POA: Insufficient documentation

## 2020-03-04 DIAGNOSIS — F419 Anxiety disorder, unspecified: Secondary | ICD-10-CM | POA: Diagnosis not present

## 2020-03-04 DIAGNOSIS — C541 Malignant neoplasm of endometrium: Secondary | ICD-10-CM | POA: Diagnosis present

## 2020-03-04 DIAGNOSIS — Z8052 Family history of malignant neoplasm of bladder: Secondary | ICD-10-CM | POA: Insufficient documentation

## 2020-03-04 DIAGNOSIS — M5116 Intervertebral disc disorders with radiculopathy, lumbar region: Secondary | ICD-10-CM | POA: Insufficient documentation

## 2020-03-04 DIAGNOSIS — E785 Hyperlipidemia, unspecified: Secondary | ICD-10-CM

## 2020-03-04 DIAGNOSIS — R69 Illness, unspecified: Secondary | ICD-10-CM | POA: Diagnosis not present

## 2020-03-04 DIAGNOSIS — Z803 Family history of malignant neoplasm of breast: Secondary | ICD-10-CM | POA: Diagnosis not present

## 2020-03-04 DIAGNOSIS — Z90710 Acquired absence of both cervix and uterus: Secondary | ICD-10-CM | POA: Diagnosis not present

## 2020-03-04 DIAGNOSIS — K5190 Ulcerative colitis, unspecified, without complications: Secondary | ICD-10-CM | POA: Diagnosis present

## 2020-03-04 DIAGNOSIS — Z91018 Allergy to other foods: Secondary | ICD-10-CM | POA: Diagnosis not present

## 2020-03-04 DIAGNOSIS — Z833 Family history of diabetes mellitus: Secondary | ICD-10-CM | POA: Diagnosis not present

## 2020-03-04 DIAGNOSIS — M51369 Other intervertebral disc degeneration, lumbar region without mention of lumbar back pain or lower extremity pain: Secondary | ICD-10-CM | POA: Diagnosis present

## 2020-03-04 DIAGNOSIS — R0789 Other chest pain: Secondary | ICD-10-CM | POA: Diagnosis not present

## 2020-03-04 DIAGNOSIS — R079 Chest pain, unspecified: Secondary | ICD-10-CM | POA: Diagnosis present

## 2020-03-04 DIAGNOSIS — Z820 Family history of epilepsy and other diseases of the nervous system: Secondary | ICD-10-CM | POA: Insufficient documentation

## 2020-03-04 DIAGNOSIS — Z8249 Family history of ischemic heart disease and other diseases of the circulatory system: Secondary | ICD-10-CM | POA: Insufficient documentation

## 2020-03-04 DIAGNOSIS — M5136 Other intervertebral disc degeneration, lumbar region: Secondary | ICD-10-CM | POA: Diagnosis present

## 2020-03-04 DIAGNOSIS — K5100 Ulcerative (chronic) pancolitis without complications: Secondary | ICD-10-CM | POA: Diagnosis not present

## 2020-03-04 LAB — BASIC METABOLIC PANEL
Anion gap: 11 (ref 5–15)
BUN: 11 mg/dL (ref 8–23)
CO2: 27 mmol/L (ref 22–32)
Calcium: 9.8 mg/dL (ref 8.9–10.3)
Chloride: 97 mmol/L — ABNORMAL LOW (ref 98–111)
Creatinine, Ser: 0.66 mg/dL (ref 0.44–1.00)
GFR calc Af Amer: >60 mL/min (ref >60)
GFR calc non Af Amer: >60 mL/min (ref >60)
Glucose, Bld: 117 mg/dL — ABNORMAL HIGH (ref 70–99)
Potassium: 3.8 mmol/L (ref 3.5–5.1)
Sodium: 135 mmol/L (ref 135–145)

## 2020-03-04 LAB — CBC
HCT: 41.5 % (ref 36.0–46.0)
Hemoglobin: 13.6 g/dL (ref 12.0–15.0)
MCH: 31 pg (ref 26.0–34.0)
MCHC: 32.8 g/dL (ref 30.0–36.0)
MCV: 94.5 fL (ref 80.0–100.0)
Platelets: 333 10*3/uL (ref 150–400)
RBC: 4.39 MIL/uL (ref 3.87–5.11)
RDW: 13.2 % (ref 11.5–15.5)
WBC: 6.8 10*3/uL (ref 4.0–10.5)
nRBC: 0 % (ref 0.0–0.2)

## 2020-03-04 LAB — TROPONIN I (HIGH SENSITIVITY)
Troponin I (High Sensitivity): 4 ng/L (ref <18)
Troponin I (High Sensitivity): 3 ng/L (ref <18)

## 2020-03-04 MED ORDER — SODIUM CHLORIDE 0.9 % IV SOLN: INTRAVENOUS | Status: DC

## 2020-03-04 MED ORDER — FONDAPARINUX SODIUM 2.5 MG/0.5ML ~~LOC~~ SOLN
2.5000 mg | Freq: Once | SUBCUTANEOUS | Status: AC
  Administered 2020-03-05: 2.5 mg via SUBCUTANEOUS
  Filled 2020-03-04: qty 0.5

## 2020-03-04 MED ORDER — ACETAMINOPHEN 325 MG PO TABS
650.0000 mg | ORAL_TABLET | ORAL | Status: DC | PRN
  Administered 2020-03-05: 650 mg via ORAL
  Filled 2020-03-04: qty 2

## 2020-03-04 MED ORDER — SODIUM CHLORIDE 0.9% FLUSH: 3.0000 mL | Freq: Once | INTRAVENOUS | Status: DC

## 2020-03-04 MED ORDER — ONDANSETRON HCL 4 MG/2ML IJ SOLN: 4.0000 mg | Freq: Four times a day (QID) | INTRAMUSCULAR | Status: DC | PRN

## 2020-03-04 NOTE — Progress Notes (Signed)
ANTICOAGULATION CONSULT NOTE - Initial Consult  Pharmacy Consult for fondaparinux Indication: ACS  Allergies  Allergen Reactions  . Beef-Derived Products Anaphylaxis  . Other Anaphylaxis    Reaction to red meat and pork products  . Pork-Derived Products Anaphylaxis    Patient Measurements: Height: 5' 1"  (154.9 cm) Weight: 81.6 kg (180 lb) IBW/kg (Calculated) : 47.8 Heparin Dosing Weight: 66.3 kg  Vital Signs: Temp: 98.3 F (36.8 C) (05/02 1804) Temp Source: Oral (05/02 1804) BP: 152/86 (05/02 2315) Pulse Rate: 106 (05/02 2315)  Labs: Recent Labs    03/04/20 1811 03/04/20 2200  HGB 13.6  --   HCT 41.5  --   PLT 333  --   CREATININE 0.66  --   TROPONINIHS 4 3    Estimated Creatinine Clearance: 71.5 mL/min (by C-G formula based on SCr of 0.66 mg/dL).   Medical History: Past Medical History:  Diagnosis Date  . Cancer [facility])    Endometrial  . Colitis   . Colitis   . DDD (degenerative disc disease), lumbar     Medications:  See medication history  Assessment: 59 yo lady to start anticoagulation for ACS.  She has allergy listed to pork and beef products (anaphylaxis) so don't feel comfortable with heparin.  [place] with Dr Jonelle Sidle to change to another agent. Goal of Therapy:  Therapeutic anticoagulation Monitor PTLC   Plan:  Arixtra 2.5 mg sq daily F/u with cards tomorrow if they want different agent  Excell Seltzer Poteet 03/04/2020,11:38 PM

## 2020-03-04 NOTE — ED Triage Notes (Signed)
Pt reports feeling unwell this morning, then this afternoon she began to feel flushed and like her heart beat was irregular, along with chest burning and hypertension. States she had a hx of tachycardia and normal stress test in 2012.

## 2020-03-04 NOTE — ED Notes (Signed)
Successful stick on the R AC.

## 2020-03-04 NOTE — H&P (Signed)
History and Physical   DAWNIELLE CHRISTIANA BTD:[id number] DOB: 10-02-1961 DOA: 03/04/2020  Referring MD/NP/PA: Dr Wilson Singer  PCP: Jerrol Banana., MD    Patient coming from: Home  Chief Complaint: Chest pain  HPI: Angela Thomas is a 59 y.o. female with medical history significant of endometrial cancer, colitis, degenerative disc disease including sciatica, allergic rhinitis, hyperlipidemia, anxiety disorder and ulcerative colitis who presented to the ER with intermittent chest discomfort more pressure has been going on for the last 2 weeks.  She had an episode 3 days ago and then today it is more persistent.  She denied any radiation.  No fever or chills.  Pain appears to be centrally located.  She came to the ER where she was seen and evaluated.  Patient appears to have no previous cardiac history.  There is a slight EKG change with some ST depressions in the lateral lead but they are subtle compared to previous EKG.  Initial enzymes negative.  Patient is being admitted for MI rule out..  ED Course: Temperature 98.3 blood pressure 189/96 pulse 106 respirate of 23 oxygen sats 96% on room air.  CBC and chemistry and largely within normal.  Initial troponin of 3.  Chest x-ray showed no acute findings.  COVID-19 screen so far negative.  EKG showed low voltage.  Mild ST depression in the inferolateral leads.  This is a slight change from EKG in 2016.  Review of Systems: As per HPI otherwise 10 point review of systems negative.    Past Medical History:  Diagnosis Date  . Cancer [facility])    Endometrial  . Colitis   . Colitis   . DDD (degenerative disc disease), lumbar     Past Surgical History:  Procedure Laterality Date  . ABDOMINAL HYSTERECTOMY     total  . BREAST CYST ASPIRATION Bilateral    neg  . BUNIONECTOMY    . BUNIONECTOMY     With hammer toe repair  . COLONOSCOPY N/A 06/15/2017   Procedure: COLONOSCOPY;  Surgeon: Lin Landsman, MD;  Location: [facility] ENDOSCOPY;  Service:  Endoscopy;  Laterality: N/A;  . CYST REMOVAL NECK    . HAMMER TOE SURGERY    . TONSILLECTOMY AND ADENOIDECTOMY       reports that she has never smoked. She has never used smokeless tobacco. She reports current alcohol use of about 1.0 standard drinks of alcohol per week. She reports that she does not use drugs.  Allergies  Allergen Reactions  . Beef-Derived Products Anaphylaxis  . Other Anaphylaxis    Reaction to red meat and pork products  . Pork-Derived Products Anaphylaxis    Family History  Problem Relation Age of Onset  . COPD Mother   . COPD Father   . Diabetes Father   . Pulmonary fibrosis Father   . Bladder Cancer Father   . Breast cancer Maternal Aunt   . Dementia Maternal Aunt   . Transient ischemic attack Maternal Uncle   . Heart disease Maternal Uncle   . Cancer Maternal Uncle        laryngeal  . Heart disease Maternal Grandfather   . Diabetes Paternal Grandfather   . Prostate cancer Paternal Grandfather   . Other Paternal Grandmother        cerebral hemorrhage  . Ovarian cancer Neg Hx      Prior to Admission medications   Medication Sig Start Date End Date Taking? Authorizing Provider  ALLERGY RELIEF 180 MG tablet TAKE ONE TABLET EVERY  DAY 12/26/19   Jerrol Banana., MD  ALPRAZolam Duanne Moron) 0.25 MG tablet TAKE ONE TABLET EVERY EIGHT HOURS AS NEEDED FOR ANXIETY 01/09/20   Jerrol Banana., MD  cycloSPORINE (RESTASIS) 0.05 % ophthalmic emulsion Place 1 drop into both eyes every 12 (twelve) hours.    [provider]  Diclofenac-miSOPROStol 75-0.2 MG TBEC TAKE 1 TABLET BY MOUTH TWICE DAILY 01/16/20   Jerrol Banana., MD  Flaxseed, Linseed, (FLAX SEED OIL) 1000 MG CAPS Take by mouth.    [provider]  fluticasone (VERAMYST) 27.5 MCG/SPRAY nasal spray Place 2 sprays into the nose daily. 02/10/19   Jerrol Banana., MD  mesalamine (LIALDA) 1.2 g EC tablet TAKE ONE TABLET TWICE DAILY 01/25/20   Lin Landsman, MD    valACYclovir (VALTREX) 500 MG tablet TAKE ONE TABLET BY MOUTH TWICE DAILY AS NEEDED 01/09/20   Jerrol Banana., MD    Physical Exam: Vitals:   03/04/20 2200 03/04/20 2215 03/04/20 2230 03/04/20 2300  BP: (!) 145/73 (!) 146/71 137/65 (!) 154/71  Pulse: 72 80 70 79  Resp: 18 (!) 22 (!) 21 14  Temp:      TempSrc:      SpO2: 98% 96% 98% 99%  Weight:      Height:          Constitutional: Anxious, no acute distress Vitals:   03/04/20 2200 03/04/20 2215 03/04/20 2230 03/04/20 2300  BP: (!) 145/73 (!) 146/71 137/65 (!) 154/71  Pulse: 72 80 70 79  Resp: 18 (!) 22 (!) 21 14  Temp:      TempSrc:      SpO2: 98% 96% 98% 99%  Weight:      Height:       Eyes: PERRL, lids and conjunctivae normal ENMT: Mucous membranes are moist. Posterior pharynx clear of any exudate or lesions.Normal dentition.  Neck: normal, supple, no masses, no thyromegaly Respiratory: clear to auscultation bilaterally, no wheezing, no crackles. Normal respiratory effort. No accessory muscle use.  Cardiovascular: Regular rate and rhythm, no murmurs / rubs / gallops. No extremity edema. 2+ pedal pulses. No carotid bruits.  Abdomen: no tenderness, no masses palpated. No hepatosplenomegaly. Bowel sounds positive.  Musculoskeletal: no clubbing / cyanosis. No joint deformity upper and lower extremities. Good ROM, no contractures. Normal muscle tone.  Mild SLR on the right Skin: no rashes, lesions, ulcers. No induration Neurologic: CN 2-12 grossly intact. Sensation intact, DTR normal. Strength 5/5 in all 4.  Psychiatric: Normal judgment and insight. Alert and oriented x 3. Normal mood.     Labs on Admission: I have personally reviewed following labs and imaging studies  CBC: Recent Labs  Lab 03/04/20 1811  WBC 6.8  HGB 13.6  HCT 41.5  MCV 94.5  PLT 353   Basic Metabolic Panel: Recent Labs  Lab 03/04/20 1811  NA 135  K 3.8  CL 97*  CO2 27  GLUCOSE 117*  BUN 11  CREATININE 0.66  CALCIUM 9.8    GFR: Estimated Creatinine Clearance: 71.5 mL/min (by C-G formula based on SCr of 0.66 mg/dL). Liver Function Tests: No results for input(s): AST, ALT, ALKPHOS, BILITOT, PROT, ALBUMIN in the last 168 hours. No results for input(s): LIPASE, AMYLASE in the last 168 hours. No results for input(s): AMMONIA in the last 168 hours. Coagulation Profile: No results for input(s): INR, PROTIME in the last 168 hours. Cardiac Enzymes: No results for input(s): CKTOTAL, CKMB, CKMBINDEX, TROPONINI in the last 168  hours. BNP (last 3 results) No results for input(s): PROBNP in the last 8760 hours. HbA1C: No results for input(s): HGBA1C in the last 72 hours. CBG: No results for input(s): GLUCAP in the last 168 hours. Lipid Profile: No results for input(s): CHOL, HDL, LDLCALC, TRIG, CHOLHDL, LDLDIRECT in the last 72 hours. Thyroid Function Tests: No results for input(s): TSH, T4TOTAL, FREET4, T3FREE, THYROIDAB in the last 72 hours. Anemia Panel: No results for input(s): VITAMINB12, FOLATE, FERRITIN, TIBC, IRON, RETICCTPCT in the last 72 hours. Urine analysis:    Component Value Date/Time   BILIRUBINUR neg 10/21/2019 0829   PROTEINUR Negative 10/21/2019 0829   UROBILINOGEN 0.2 10/21/2019 0829   NITRITE neg 10/21/2019 0829   LEUKOCYTESUR Negative 10/21/2019 0829   Sepsis Labs: @LABRCNTIP (procalcitonin:4,lacticidven:4) )No results found for this or any previous visit (from the past 240 hour(s)).   Radiological Exams on Admission: DG Chest 2 View  Result Date: 03/04/2020 CLINICAL DATA:  Chest pain. EXAM: CHEST - 2 VIEW COMPARISON:  April 25, 2011 FINDINGS: The heart size and mediastinal contours are within normal limits. Both lungs are clear. The visualized skeletal structures are unremarkable. IMPRESSION: No active cardiopulmonary disease. Electronically Signed   By: Constance Holster M.D.   On: 03/04/2020 18:25    EKG: Independently reviewed.  Normal sinus rhythm with a rate of 84.  Normal  intervals.  Normal voltage.  Mild ST depression in the lateral leads.  Assessment/Plan Principal Problem:   Chest pain Active Problems:   Cancer of endometrium (HCC)   Hyperlipidemia   DDD (degenerative disc disease), lumbar   Ulcerative colitis ([place])     #1 intermittent chest pain: Patient will be admitted for MI rule out.  Initial enzymes are negative.  Continue to cycle enzymes.  Patient may get echocardiogram or cardiac stress testing in the morning if enzymes remain negative.  #2 hyperlipidemia: Continue home regimen.  #3 sciatica: Resume home regimen and continue.  #4 history of ulcerative colitis: Stable at baseline.   DVT prophylaxis: None Heparin due to allergy, Arixtra Code Status: Full code Family Communication: Daughter at bedside Disposition Plan: Home Consults called: None but may require cardiology consult if abnormal testing Admission status: Observation  Severity of Illness: The appropriate patient status for this patient is OBSERVATION. Observation status is judged to be reasonable and necessary in order to provide the required intensity of service to ensure the patient's safety. The patient's presenting symptoms, physical exam findings, and initial radiographic and laboratory data in the context of their medical condition is felt to place them at decreased risk for further clinical deterioration. Furthermore, it is anticipated that the patient will be medically stable for discharge from the hospital within 2 midnights of admission. The following factors support the patient status of observation.   " The patient's presenting symptoms include intermittent chest pain. " The physical exam findings include no significant findings on exam. " The initial radiographic and laboratory data are slightly abnormal EKG.     Barbette Merino MD [facility] Pager [phone]  If 7PM-7AM, please contact night-coverage [email or web address] Password [facility]  03/04/2020, 11:21  PM

## 2020-03-04 NOTE — ED Provider Notes (Signed)
[facility] EMERGENCY DEPARTMENT Provider Note   CSN: [id number] Arrival date & time: 03/04/20  1759     History Chief Complaint  Patient presents with  . Chest Pain    JAMECA CHUMLEY is a 59 y.o. female.  HPI   59 year old female with chest pain.  She woke up this morning and states that she generally did not feel well.  This felt very fatigued.  Then around 2:30 PM she had an episode of chest pressure.  Was coming and going states that it lasted a few seconds after about a minute.  She not noticed anything that seemed to improve it or make it worse.  Denies any associated symptoms such as dyspnea, nausea or diaphoresis.  Continue to feel fatigued this afternoon and evenings are came to the emergency room.  No fevers or chills.  No unusual leg pain or swelling.  No known cardiac issues that she is aware of.  Past Medical History:  Diagnosis Date  . Cancer [facility])    Endometrial  . Colitis   . Colitis   . DDD (degenerative disc disease), lumbar     Patient Active Problem List   Diagnosis Date Noted  . Ulcerative colitis ([place]) 07/13/2017  . Corneal scar and opacity 07/08/2017  . Presbyopia 07/08/2017  . Myopia 07/08/2017  . Hematochezia 06/14/2017  . Other fatigue 06/18/2016  . Vaginal atrophy 06/18/2016  . DDD (degenerative disc disease), lumbar 04/12/2015  . Vitamin D deficiency 04/12/2015  . Obesity 04/12/2015  . Acute anxiety 04/12/2015  . Allergic rhinitis 04/12/2015  . Fibrocystic breast disease 04/12/2015  . Hyperlipidemia 02/18/2013  . Arthritis 02/18/2013  . Cancer of endometrium ([place]) 08/02/2012    Past Surgical History:  Procedure Laterality Date  . ABDOMINAL HYSTERECTOMY     total  . BREAST CYST ASPIRATION Bilateral    neg  . BUNIONECTOMY    . BUNIONECTOMY     With hammer toe repair  . COLONOSCOPY N/A 06/15/2017   Procedure: COLONOSCOPY;  Surgeon: Lin Landsman, MD;  Location: [facility] ENDOSCOPY;  Service: Endoscopy;  Laterality:  N/A;  . CYST REMOVAL NECK    . HAMMER TOE SURGERY    . TONSILLECTOMY AND ADENOIDECTOMY       OB History    Gravida  2   Para  2   Term  2   Preterm      AB      Living  2     SAB      TAB      Ectopic      Multiple      Live Births  2           Family History  Problem Relation Age of Onset  . COPD Mother   . COPD Father   . Diabetes Father   . Pulmonary fibrosis Father   . Bladder Cancer Father   . Breast cancer Maternal Aunt   . Dementia Maternal Aunt   . Transient ischemic attack Maternal Uncle   . Heart disease Maternal Uncle   . Cancer Maternal Uncle        laryngeal  . Heart disease Maternal Grandfather   . Diabetes Paternal Grandfather   . Prostate cancer Paternal Grandfather   . Other Paternal Grandmother        cerebral hemorrhage  . Ovarian cancer Neg Hx     Social History   Tobacco Use  . Smoking status: Never Smoker  . Smokeless tobacco:  Never Used  Substance Use Topics  . Alcohol use: Yes    Alcohol/week: 1.0 standard drinks    Types: 1 Glasses of wine per week    Comment: occas  . Drug use: No    Home Medications Prior to Admission medications   Medication Sig Start Date End Date Taking? Authorizing Provider  ALLERGY RELIEF 180 MG tablet TAKE ONE TABLET EVERY DAY 12/26/19   Jerrol Banana., MD  ALPRAZolam Duanne Moron) 0.25 MG tablet TAKE ONE TABLET EVERY EIGHT HOURS AS NEEDED FOR ANXIETY 01/09/20   Jerrol Banana., MD  cycloSPORINE (RESTASIS) 0.05 % ophthalmic emulsion Place 1 drop into both eyes every 12 (twelve) hours.    [provider]  Diclofenac-miSOPROStol 75-0.2 MG TBEC TAKE 1 TABLET BY MOUTH TWICE DAILY 01/16/20   Jerrol Banana., MD  Flaxseed, Linseed, (FLAX SEED OIL) 1000 MG CAPS Take by mouth.    [provider]  fluticasone (VERAMYST) 27.5 MCG/SPRAY nasal spray Place 2 sprays into the nose daily. 02/10/19   Jerrol Banana., MD  mesalamine (LIALDA) 1.2 g EC tablet TAKE ONE TABLET  TWICE DAILY 01/25/20   Lin Landsman, MD  valACYclovir (VALTREX) 500 MG tablet TAKE ONE TABLET BY MOUTH TWICE DAILY AS NEEDED 01/09/20   Jerrol Banana., MD    Allergies    Beef-derived products, Other, and Pork-derived products  Review of Systems   Review of Systems All systems reviewed and negative, other than as noted in HPI.  Physical Exam Updated Vital Signs BP (!) 154/71   Pulse 79   Temp 98.3 F (36.8 C) (Oral)   Resp 14   Ht 5' 1"  (1.549 m)   Wt 81.6 kg   SpO2 99%   BMI 34.01 kg/m   Physical Exam Vitals and nursing note reviewed.  Constitutional:      General: She is not in acute distress.    Appearance: She is well-developed.  HENT:     Head: Normocephalic and atraumatic.  Eyes:     General:        Right eye: No discharge.        Left eye: No discharge.     Conjunctiva/sclera: Conjunctivae normal.  Cardiovascular:     Rate and Rhythm: Normal rate and regular rhythm.     Heart sounds: Normal heart sounds. No murmur. No friction rub. No gallop.   Pulmonary:     Effort: Pulmonary effort is normal. No respiratory distress.     Breath sounds: Normal breath sounds.  Abdominal:     General: There is no distension.     Palpations: Abdomen is soft.     Tenderness: There is no abdominal tenderness.  Musculoskeletal:        General: No tenderness.     Cervical back: Neck supple.  Skin:    General: Skin is warm and dry.  Neurological:     Mental Status: She is alert.  Psychiatric:        Behavior: Behavior normal.        Thought Content: Thought content normal.     ED Results / Procedures / Treatments   Labs (all labs ordered are listed, but only abnormal results are displayed) Labs Reviewed  BASIC METABOLIC PANEL - Abnormal; Notable for the following components:      Result Value   Chloride 97 (*)    Glucose, Bld 117 (*)    All other components within normal limits  CBC  TROPONIN  I (HIGH SENSITIVITY)  TROPONIN I (HIGH SENSITIVITY)     EKG EKG Interpretation  Date/Time:  Sunday Mar 04 2020 21:00:47 EDT Ventricular Rate:  84 PR Interval:    QRS Duration: 79 QT Interval:  372 QTC Calculation: 440 R Axis:   37 Text Interpretation: Sinus rhythm Low voltage, precordial leads mild ST depression inferiolry and lateral precordial leads Confirmed by Faylene Allerton ([id number]) on 03/04/2020 10:26:30 PM   Radiology DG Chest 2 View  Result Date: 03/04/2020 CLINICAL DATA:  Chest pain. EXAM: CHEST - 2 VIEW COMPARISON:  April 25, 2011 FINDINGS: The heart size and mediastinal contours are within normal limits. Both lungs are clear. The visualized skeletal structures are unremarkable. IMPRESSION: No active cardiopulmonary disease. Electronically Signed   By: Christopher  Green M.D.   On: 03/04/2020 18:25    Procedures Procedures (including critical care time)  Medications Ordered in ED Medications  sodium chloride flush (NS) 0.9 % injection 3 mL (has no administration in time range)    ED Course  I have reviewed the triage vital signs and the nursing notes.  Pertinent labs & imaging results that were available during my care of the patient were reviewed by me and considered in my medical decision making (see chart for details).    MDM Rules/Calculators/A&P                      59  year old female with chest pain.  Seems atypical but she does have a heart score 4.  Her EKG is also changed with some mild ST depression inferiorly and lateral precordial leads.  Last one for comparison purposes was several years ago though.  Currently symptom-free.  Initial troponin is normal.    Final Clinical Impression(s) / ED Diagnoses Final diagnoses:  Chest pain, unspecified type    Rx / DC Orders ED Discharge Orders    None       Virgel Manifold, MD 03/06/20 2306

## 2020-03-05 ENCOUNTER — Observation Stay (HOSPITAL_BASED_OUTPATIENT_CLINIC_OR_DEPARTMENT_OTHER): Payer: 59

## 2020-03-05 DIAGNOSIS — R079 Chest pain, unspecified: Secondary | ICD-10-CM

## 2020-03-05 DIAGNOSIS — C541 Malignant neoplasm of endometrium: Secondary | ICD-10-CM

## 2020-03-05 DIAGNOSIS — R030 Elevated blood-pressure reading, without diagnosis of hypertension: Secondary | ICD-10-CM | POA: Diagnosis not present

## 2020-03-05 DIAGNOSIS — R7303 Prediabetes: Secondary | ICD-10-CM | POA: Diagnosis not present

## 2020-03-05 DIAGNOSIS — K5100 Ulcerative (chronic) pancolitis without complications: Secondary | ICD-10-CM | POA: Diagnosis not present

## 2020-03-05 DIAGNOSIS — M5136 Other intervertebral disc degeneration, lumbar region: Secondary | ICD-10-CM | POA: Diagnosis not present

## 2020-03-05 LAB — LIPID PANEL
Cholesterol: 222 mg/dL — ABNORMAL HIGH (ref 0–200)
HDL: 57 mg/dL (ref >40)
LDL Cholesterol: 151 mg/dL — ABNORMAL HIGH (ref 0–99)
Total CHOL/HDL Ratio: 3.9 RATIO
Triglycerides: 69 mg/dL (ref <150)
VLDL: 14 mg/dL (ref 0–40)

## 2020-03-05 LAB — HEMOGLOBIN A1C
Hgb A1c MFr Bld: 5.8 % — ABNORMAL HIGH (ref 4.8–5.6)
Mean Plasma Glucose: 119.76 mg/dL

## 2020-03-05 LAB — HIV ANTIBODY (ROUTINE TESTING W REFLEX): HIV Screen 4th Generation wRfx: NONREACTIVE

## 2020-03-05 LAB — ECHOCARDIOGRAM COMPLETE
Height: 61 in
Weight: 2880 oz

## 2020-03-05 LAB — TSH: TSH: 2.305 u[IU]/mL (ref 0.350–4.500)

## 2020-03-05 LAB — SARS CORONAVIRUS 2 (TAT 6-24 HRS): SARS Coronavirus 2: NEGATIVE

## 2020-03-05 MED ORDER — CYCLOSPORINE 0.05 % OP EMUL
1.0000 [drp] | Freq: Two times a day (BID) | OPHTHALMIC | Status: DC
  Filled 2020-03-05: qty 1

## 2020-03-05 MED ORDER — LORATADINE 10 MG PO TABS: 10.0000 mg | ORAL_TABLET | Freq: Every day | ORAL | Status: DC

## 2020-03-05 MED ORDER — MESALAMINE 1.2 G PO TBEC
1.2000 g | DELAYED_RELEASE_TABLET | Freq: Two times a day (BID) | ORAL | Status: DC
  Filled 2020-03-05: qty 1

## 2020-03-05 MED ORDER — ALPRAZOLAM 0.25 MG PO TABS: 0.2500 mg | ORAL_TABLET | Freq: Three times a day (TID) | ORAL | Status: DC | PRN

## 2020-03-05 MED ORDER — FONDAPARINUX SODIUM 2.5 MG/0.5ML ~~LOC~~ SOLN
2.5000 mg | SUBCUTANEOUS | Status: DC
  Filled 2020-03-05: qty 0.5

## 2020-03-05 MED ORDER — FLUTICASONE PROPIONATE 50 MCG/ACT NA SUSP
2.0000 | Freq: Every day | NASAL | Status: DC
  Filled 2020-03-05: qty 16

## 2020-03-05 NOTE — ED Notes (Signed)
Spoke to Dr. Cyndia Skeeters via secure chat, he has spoken to patient and she may be discharged home at this time. Pt agrees with plan. Patient verbalizes understanding of discharge instructions. Opportunity for questioning and answers were provided. Armband removed by staff, pt discharged from ED to home via [place] with daughter

## 2020-03-05 NOTE — ED Notes (Signed)
Pt took home meds d/t them not being ordered. RN consulted Pharmacy for verification. Arthrotec and Fish Oil.

## 2020-03-05 NOTE — Discharge Summary (Signed)
Physician Discharge Summary  ROWEN HUR DIY:[id number] DOB: Dec 31, 1960 DOA: 03/04/2020  PCP: Jerrol Banana., MD  Admit date: 03/04/2020 Discharge date: 03/05/2020  Admitted From: Home Disposition: Home  Recommendations for Outpatient Follow-up:  1. Follow ups as below. 2. Please check blood pressure at follow-up. 3. Please follow up on the following pending results: None  Home Health: None Equipment/Devices: None  Discharge Condition: Stable CODE STATUS: Full code  Follow-up Information    Jerrol Banana., MD. Schedule an appointment as soon as possible for a visit in 1 week(s).   Specialty: Family Medicine Contact information: [address] [place] [address] [phone]            Hospital Course: 59 year old female with history of endometrial cancer, ulcerative colitis, DDD with sciatica, hyperlipidemia, allergic rhinitis, anxiety and tachycardia presenting with episodic chest discomfort that she describes as "twinges".  Previously had similar symptoms attributed to stress/anxiety.  She has history of tachycardia.  Reportedly had unrevealing work-up for this.  She had no exertional chest pain, dyspnea, significant risk factors or family history  In ED, BP 189/96 on arrival but down to 130s/70s.  HR 106.  RR 23.  96% on room air.  CBC and CMP without significant finding.  High-sensitivity troponin negative x2.  EKG with T wave changes in lateral and inferior leads.  Echocardiogram with EF of 65 to 70% and G1 DD but no other significant finding.  She remained stable during her stay.  Chest discomfort resolved.  No events on telemetry.  Discharged home in stable conditions to follow-up with her primary care doctor.  We have ordered referral to cardiology for outpatient stress test and further evaluation.  Patient was counseled on lifestyle change including diet and exercise to have her blood pressure under good control.  See individual  problem list below for more hospital course. Discharge Diagnoses:  Atypical chest pain-could be related to uncontrolled hypertension.  ACS very unlikely given the very atypical nature of a chest pain, reassuring serial troponin and echocardiogram.  EKG with nonspecific T wave changes which could be due to uncontrolled hypertension.  Overall, chest pain resolved.  Hypertension improved.  A1c 5.8%.  LDL 151.  TSH 2.3. -Discharged to follow-up with PCP. -Ambulatory referral to cardiology for outpatient stress test.  Elevated blood pressure: No history of hypertension.  BP 189/96 on arrival but down to 130s/70s on discharge without medications. -Encourage lifestyle change including diet and exercise. -Recheck BP at follow-up.  Prediabetes: A1c 5.8%. -Counseled on lifestyle changes above  Other medical conditions including DDD/sciatica, ulcerative colitis, allergic rhinitis and anxiety stable. -Discharged on home medications.                 Discharge Exam: Vitals:   03/05/20 0818 03/05/20 1408  BP: 139/72 (!) 142/77  Pulse: 91   Resp: 18 19  Temp:  98.4 F (36.9 C)  SpO2: 100% 100%    GENERAL: No apparent distress.  Nontoxic. HEENT: MMM.  Vision and hearing grossly intact.  NECK: Supple.  No apparent JVD.  RESP:  No IWOB.  Fair aeration bilaterally. CVS:  RRR. Heart sounds normal.  ABD/GI/GU: Bowel sounds present. Soft. Non tender.  MSK/EXT:  Moves extremities. No apparent deformity. No edema.  SKIN: no apparent skin lesion or wound NEURO: Awake, alert and oriented appropriately.  No apparent focal neuro deficit. PSYCH: Calm. Normal affect.   Discharge Instructions  Discharge Instructions    Ambulatory referral to Cardiology  Complete by: As directed    Chest pain and tachycardia   Call MD for:  difficulty breathing, headache or visual disturbances   Complete by: As directed    Call MD for:  extreme fatigue   Complete by: As directed    Call MD for:  persistant  dizziness or light-headedness   Complete by: As directed    Call MD for:  persistant nausea and vomiting   Complete by: As directed    Call MD for:  severe uncontrolled pain   Complete by: As directed    Call MD for:  temperature >100.4   Complete by: As directed    Diet - low sodium heart healthy   Complete by: As directed    Discharge instructions   Complete by: As directed    It has been a pleasure taking care of you! You were hospitalized with some chest discomfort.  After the test is we have done, it is unlikely that your chest discomfort is related to your heart.  Your heart enzyme numbers, EKG and echocardiogram did not reveal anything significant.  We have ordered a referral to cardiology.  Someone will be in touch with you from the cardiology office to schedule this follow-up. Please review your new medication list and the directions before you take your medications.   Take care,   Increase activity slowly   Complete by: As directed      Allergies as of 03/05/2020      Reactions   Beef-derived Products Anaphylaxis   Other Anaphylaxis   Reaction to red meat and pork products   Pork-derived Products Anaphylaxis      Medication List    TAKE these medications   ALPRAZolam 0.25 MG tablet Commonly known as: XANAX TAKE ONE TABLET EVERY EIGHT HOURS AS NEEDED FOR ANXIETY What changed: See the new instructions.   Diclofenac-miSOPROStol 75-0.2 MG Tbec TAKE 1 TABLET BY MOUTH TWICE DAILY What changed: when to take this   fexofenadine 180 MG tablet Commonly known as: ALLEGRA Take 180 mg by mouth daily.   fluticasone 27.5 MCG/SPRAY nasal spray Commonly known as: VERAMYST Place 2 sprays into the nose daily.   mesalamine 1.2 g EC tablet Commonly known as: LIALDA TAKE ONE TABLET TWICE DAILY What changed: when to take this   Restasis 0.05 % ophthalmic emulsion Generic drug: cycloSPORINE Place 1 drop into both eyes every 12 (twelve) hours.   valACYclovir 500 MG  tablet Commonly known as: VALTREX TAKE ONE TABLET BY MOUTH TWICE DAILY AS NEEDED What changed: reasons to take this       Consultations:  None  Procedures/Studies:  2D Echo on 03/05/2020 1. Left ventricular ejection fraction, by estimation, is 65 to 70%. The  left ventricle has normal function. The left ventricle has no regional  wall motion abnormalities. There is mild asymmetric left ventricular  hypertrophy of the septal segment. Left  ventricular diastolic parameters are consistent with Grade I diastolic  dysfunction (impaired relaxation).  2. Right ventricular systolic function is normal. The right ventricular  size is normal. There is normal pulmonary artery systolic pressure.  3. The mitral valve is normal in structure. No evidence of mitral valve  regurgitation. No evidence of mitral stenosis.  4. The aortic valve is normal in structure. Aortic valve regurgitation is  not visualized. No aortic stenosis is present.  5. The inferior vena cava is normal in size with greater than 50%  respiratory variability, suggesting right atrial pressure of 3 mmHg.  DG Chest 2 View  Result Date: 03/04/2020 CLINICAL DATA:  Chest pain. EXAM: CHEST - 2 VIEW COMPARISON:  April 25, 2011 FINDINGS: The heart size and mediastinal contours are within normal limits. Both lungs are clear. The visualized skeletal structures are unremarkable. IMPRESSION: No active cardiopulmonary disease. Electronically Signed   By: Constance Holster M.D.   On: 03/04/2020 18:25   ECHOCARDIOGRAM COMPLETE  Result Date: 03/05/2020    ECHOCARDIOGRAM REPORT   Patient Name:   Angela Thomas Date of Exam: 03/05/2020 Medical Rec #:  [id number]       Height:       61.0 in Accession #:    [id number]      Weight:       180.0 lb Date of Birth:  [birth year]       BSA:          1.806 m Patient Age:    59 years        BP:           139/72 mmHg Patient Gender: F               HR:           81 bpm. Exam Location:  Inpatient Procedure:  2D Echo, Color Doppler and Cardiac Doppler Indications:    R07.9* Chest pain, unspecified  History:        Patient has no prior history of Echocardiogram examinations.                 Risk Factors:Dyslipidemia.  Sonographer:    Raquel Sarna Senior RDCS Referring Phys: [place]  1. Left ventricular ejection fraction, by estimation, is 65 to 70%. The left ventricle has normal function. The left ventricle has no regional wall motion abnormalities. There is mild asymmetric left ventricular hypertrophy of the septal segment. Left ventricular diastolic parameters are consistent with Grade I diastolic dysfunction (impaired relaxation).  2. Right ventricular systolic function is normal. The right ventricular size is normal. There is normal pulmonary artery systolic pressure.  3. The mitral valve is normal in structure. No evidence of mitral valve regurgitation. No evidence of mitral stenosis.  4. The aortic valve is normal in structure. Aortic valve regurgitation is not visualized. No aortic stenosis is present.  5. The inferior vena cava is normal in size with greater than 50% respiratory variability, suggesting right atrial pressure of 3 mmHg. FINDINGS  Left Ventricle: Left ventricular ejection fraction, by estimation, is 65 to 70%. The left ventricle has normal function. The left ventricle has no regional wall motion abnormalities. The left ventricular internal cavity size was normal in size. There is  mild asymmetric left ventricular hypertrophy of the septal segment. Left ventricular diastolic parameters are consistent with Grade I diastolic dysfunction (impaired relaxation). Right Ventricle: The right ventricular size is normal. No increase in right ventricular wall thickness. Right ventricular systolic function is normal. There is normal pulmonary artery systolic pressure. The tricuspid regurgitant velocity is 2.57 m/s, and  with an assumed right atrial pressure of 3 mmHg, the estimated right  ventricular systolic pressure is 19.6 mmHg. Left Atrium: Left atrial size was normal in size. Right Atrium: Right atrial size was normal in size. Pericardium: There is no evidence of pericardial effusion. Mitral Valve: The mitral valve is normal in structure. Normal mobility of the mitral valve leaflets. No evidence of mitral valve regurgitation. No evidence of mitral valve stenosis. Tricuspid Valve: The tricuspid valve is normal in structure. Tricuspid valve  regurgitation is not demonstrated. No evidence of tricuspid stenosis. Aortic Valve: The aortic valve is normal in structure. Aortic valve regurgitation is not visualized. No aortic stenosis is present. Pulmonic Valve: The pulmonic valve was normal in structure. Pulmonic valve regurgitation is not visualized. No evidence of pulmonic stenosis. Aorta: The aortic root is normal in size and structure. Venous: The inferior vena cava is normal in size with greater than 50% respiratory variability, suggesting right atrial pressure of 3 mmHg. IAS/Shunts: No atrial level shunt detected by color flow Doppler.  LEFT VENTRICLE PLAX 2D LVIDd:         3.65 cm  Diastology LVIDs:         2.18 cm  LV e' lateral:   10.20 cm/s LV PW:         0.96 cm  LV E/e' lateral: 7.7 LV IVS:        1.36 cm  LV e' medial:    7.94 cm/s LVOT diam:     2.00 cm  LV E/e' medial:  9.8 LV SV:         64 LV SV Index:   36 LVOT Area:     3.14 cm  RIGHT VENTRICLE RV S prime:     12.10 cm/s TAPSE (M-mode): 2.4 cm LEFT ATRIUM             Index       RIGHT ATRIUM           Index LA diam:        3.00 cm 1.66 cm/m  RA Area:     12.50 cm LA Vol (A2C):   41.1 ml 22.76 ml/m RA Volume:   25.70 ml  14.23 ml/m LA Vol (A4C):   44.1 ml 24.42 ml/m LA Biplane Vol: 42.8 ml 23.70 ml/m  AORTIC VALVE LVOT Vmax:   94.60 cm/s LVOT Vmean:  68.700 cm/s LVOT VTI:    0.205 m  AORTA Ao Root diam: 3.40 cm Ao Asc diam:  3.60 cm MITRAL VALVE               TRICUSPID VALVE MV Area (PHT): 3.48 cm    TR Peak grad:   26.4 mmHg MV  Decel Time: 218 msec    TR Vmax:        257.00 cm/s MV E velocity: 78.10 cm/s MV A velocity: 69.80 cm/s  SHUNTS MV E/A ratio:  1.12        Systemic VTI:  0.20 m                            Systemic Diam: 2.00 cm Candee Furbish MD Electronically signed by Candee Furbish MD Signature Date/Time: 03/05/2020/1:02:58 PM    Final         The results of significant diagnostics from this hospitalization (including imaging, microbiology, ancillary and laboratory) are listed below for reference.     Microbiology: Recent Results (from the past 240 hour(s))  SARS CORONAVIRUS 2 (TAT 6-24 HRS) Nasopharyngeal Nasopharyngeal Swab     Status: None   Collection Time: 03/05/20  2:28 AM   Specimen: Nasopharyngeal Swab  Result Value Ref Range Status   SARS Coronavirus 2 NEGATIVE NEGATIVE Final    Comment: (NOTE) SARS-CoV-2 target nucleic acids are NOT DETECTED. The SARS-CoV-2 RNA is generally detectable in upper and lower respiratory specimens during the acute phase of infection. Negative results do not preclude SARS-CoV-2 infection, do not rule out co-infections with other  pathogens, and should not be used as the sole basis for treatment or other patient management decisions. Negative results must be combined with clinical observations, patient history, and epidemiological information. The expected result is Negative. Fact Sheet for Patients: SugarRoll.be Fact Sheet for Healthcare Providers: https://www.woods-mathews.com/ This test is not yet approved or cleared by the [place] FDA and  has been authorized for detection and/or diagnosis of SARS-CoV-2 by FDA under an Emergency Use Authorization (EUA). This EUA will remain  in effect (meaning this test can be used) for the duration of the COVID-19 declaration under Section 56 4(b)(1) of the Act, 21 U.S.C. section 360bbb-3(b)(1), unless the authorization is terminated or revoked sooner. Performed at [facility], [place] [address] [place], [place] [address]      Labs: BNP (last 3 results) No results for input(s): BNP in the last 8760 hours. Basic Metabolic Panel: Recent Labs  Lab 03/04/20 1811  NA 135  K 3.8  CL 97*  CO2 27  GLUCOSE 117*  BUN 11  CREATININE 0.66  CALCIUM 9.8   Liver Function Tests: No results for input(s): AST, ALT, ALKPHOS, BILITOT, PROT, ALBUMIN in the last 168 hours. No results for input(s): LIPASE, AMYLASE in the last 168 hours. No results for input(s): AMMONIA in the last 168 hours. CBC: Recent Labs  Lab 03/04/20 1811  WBC 6.8  HGB 13.6  HCT 41.5  MCV 94.5  PLT 333   Cardiac Enzymes: No results for input(s): CKTOTAL, CKMB, CKMBINDEX, TROPONINI in the last 168 hours. BNP: Invalid input(s): POCBNP CBG: No results for input(s): GLUCAP in the last 168 hours. D-Dimer No results for input(s): DDIMER in the last 72 hours. Hgb A1c Recent Labs    03/05/20 0723  HGBA1C 5.8*   Lipid Profile Recent Labs    03/05/20 0723  CHOL 222*  HDL 57  LDLCALC 151*  TRIG 69  CHOLHDL 3.9   Thyroid function studies Recent Labs    03/05/20 0723  TSH 2.305   Anemia work up No results for input(s): VITAMINB12, FOLATE, FERRITIN, TIBC, IRON, RETICCTPCT in the last 72 hours. Urinalysis    Component Value Date/Time   BILIRUBINUR neg 10/21/2019 0829   PROTEINUR Negative 10/21/2019 0829   UROBILINOGEN 0.2 10/21/2019 0829   NITRITE neg 10/21/2019 0829   LEUKOCYTESUR Negative 10/21/2019 0829   Sepsis Labs Invalid input(s): PROCALCITONIN,  WBC,  LACTICIDVEN   Time coordinating discharge: 25 minutes  SIGNED:  Mercy Riding, MD  [facility] 03/05/2020, 3:00 PM  If 7PM-7AM, please contact night-coverage [email or web address] Password [email or web address]

## 2020-03-05 NOTE — Progress Notes (Signed)
Echocardiogram 2D Echocardiogram has been performed.  [place] Deputy Jesicca Dipierro 03/05/2020, 11:59 AM

## 2020-03-06 DIAGNOSIS — M545 Low back pain: Secondary | ICD-10-CM | POA: Diagnosis not present

## 2020-03-07 ENCOUNTER — Ambulatory Visit: Payer: 59 | Admitting: Family Medicine

## 2020-03-08 DIAGNOSIS — M545 Low back pain: Secondary | ICD-10-CM | POA: Diagnosis not present

## 2020-03-08 NOTE — Progress Notes (Signed)
Trena Platt Cummings,acting as a scribe for [name], MD.,have documented all relevant documentation on the behalf of [name], MD,as directed by  [name], MD while in the presence of [name], MD. Established patient visit   Patient: Angela Thomas   DOB: 11-09-1960   59 y.o. Female  MRN: [id number] Visit Date: 03/15/2020  Today's healthcare provider: [name], MD   Chief Complaint  Patient presents with  . Hospitalization Follow-up   Subjective    HPI Follow up Hospitalization Patient is feeling much better.  ED work-up was negative.  She has appointment with Dr. Harrington Challenger from cardiology in about 6 weeks. Patient was admitted to [facility] on 03/04/20 and discharged on 03/05/20 She was treated for Chest pain. Treatment for this included labs, chest xray, ekg, referral to cardiology.  Telephone follow up was not done.  She reports good compliance with treatment. She reports this condition is improved.  ----------------------------------------------------------------------------------------- -  Patient having episodes of tachycardia.    Social History   Tobacco Use  . Smoking status: Never Smoker  . Smokeless tobacco: Never Used  Substance Use Topics  . Alcohol use: Yes    Alcohol/week: 1.0 standard drinks    Types: 1 Glasses of wine per week    Comment: occas  . Drug use: No       Medications: Outpatient Medications Prior to Visit  Medication Sig  . ALPRAZolam (XANAX) 0.25 MG tablet TAKE ONE TABLET EVERY EIGHT HOURS AS NEEDED FOR ANXIETY (Patient taking differently: Take 0.25 mg by mouth 3 (three) times daily as needed for anxiety or sleep. )  . cycloSPORINE (RESTASIS) 0.05 % ophthalmic emulsion Place 1 drop into both eyes every 12 (twelve) hours.  . Diclofenac-miSOPROStol 75-0.2 MG TBEC TAKE 1 TABLET BY MOUTH TWICE DAILY (Patient taking differently: Take 1 tablet by mouth in the morning and at bedtime. )  . fexofenadine  (ALLEGRA) 180 MG tablet Take 180 mg by mouth daily.  . fluticasone (VERAMYST) 27.5 MCG/SPRAY nasal spray Place 2 sprays into the nose daily.  . mesalamine (LIALDA) 1.2 g EC tablet TAKE ONE TABLET TWICE DAILY (Patient taking differently: Take 1.2 g by mouth in the morning and at bedtime. )  . valACYclovir (VALTREX) 500 MG tablet TAKE ONE TABLET BY MOUTH TWICE DAILY AS NEEDED (Patient taking differently: Take 500 mg by mouth 2 (two) times daily as needed (for breakouts). )   No facility-administered medications prior to visit.    Review of Systems  Constitutional: Negative for appetite change, chills, fatigue and fever.  Eyes: Negative.   Respiratory: Negative for chest tightness and shortness of breath.   Cardiovascular: Positive for palpitations. Negative for chest pain.  Gastrointestinal: Negative for abdominal pain, nausea and vomiting.  Endocrine: Negative.   Musculoskeletal: Positive for back pain.  Allergic/Immunologic: Negative.   Neurological: Negative for dizziness and weakness.  Psychiatric/Behavioral: The patient is nervous/anxious.        Patient has had significant issues in the past year with significant sciatica left greater than right and in January her her 66-year-old grandson developed seizures       Objective    BP 133/79 (BP Location: Left Arm, Patient Position: Sitting, Cuff Size: Large)   Pulse 98   Temp (!) 97.3 F (36.3 C) (Temporal)   Ht 5' 1"  (1.549 m)   Wt 181 lb 3.2 oz (82.2 kg)   BMI 34.24 kg/m  BP Readings from Last 3 Encounters:  03/15/20 133/79  03/05/20 (!) 142/77  10/21/19 118/76   Wt Readings from Last 3 Encounters:  03/15/20 181 lb 3.2 oz (82.2 kg)  03/04/20 180 lb (81.6 kg)  10/21/19 185 lb 9.6 oz (84.2 kg)      Physical Exam Vitals reviewed.  Constitutional:      Appearance: Normal appearance.  HENT:     Head: Normocephalic and atraumatic.     Right Ear: External ear normal.     Left Ear: External ear normal.  Eyes:      General: No scleral icterus.    Conjunctiva/sclera: Conjunctivae normal.  Cardiovascular:     Rate and Rhythm: Normal rate and regular rhythm.     Pulses: Normal pulses.     Heart sounds: Normal heart sounds.  Pulmonary:     Effort: Pulmonary effort is normal.     Breath sounds: Normal breath sounds.  Abdominal:     Palpations: Abdomen is soft.  Skin:    General: Skin is warm and dry.  Neurological:     Mental Status: She is alert and oriented to person, place, and time. Mental status is at baseline.  Psychiatric:        Mood and Affect: Mood normal.        Behavior: Behavior normal.        Thought Content: Thought content normal.        Judgment: Judgment normal.     ECG reveals normal sinus rhythm with no ST-T wave changes  No results found for any visits on 03/15/20.  Assessment & Plan    1. Essential hypertension Start metoprolol succinate 25 mg daily.  Recheck 3 to 4 weeks.  2. Tachycardia Toprol 25 mg daily.  She has follow-up with cardiology in 6 weeks for evaluation.  I am fine with waiting for that evaluation at this time. - EKG 12-Lead - metoprolol succinate (TOPROL-XL) 25 MG 24 hr tablet; Take 1 tablet (25 mg total) by mouth daily.  Dispense: 90 tablet; Refill: 3  3. Allergic reaction to alpha-gal   4. Acute anxiety Dealing with chronic back pain and a grandson who has been unwell this year.  5. Ulcerative pancolitis without complication (HCC) Asymptomatic. 6.Chest pain Chest pain is resolved   No follow-ups on file.      I, [name], MD, have reviewed all documentation for this visit. The documentation on 03/19/20 for the exam, diagnosis, procedures, and orders are all accurate and complete.    Juneau Doughman Cranford Mon, MD  [facility] [phone] (phone) [phone] (fax)  [place]

## 2020-03-12 DIAGNOSIS — M545 Low back pain: Secondary | ICD-10-CM | POA: Diagnosis not present

## 2020-03-15 ENCOUNTER — Other Ambulatory Visit: Payer: Self-pay

## 2020-03-15 ENCOUNTER — Encounter: Payer: Self-pay | Admitting: Family Medicine

## 2020-03-15 ENCOUNTER — Ambulatory Visit: Payer: 59 | Admitting: Family Medicine

## 2020-03-15 VITALS — BP 133/79 | HR 98 | Temp 97.3°F | Ht 61.0 in | Wt 181.2 lb

## 2020-03-15 DIAGNOSIS — K5100 Ulcerative (chronic) pancolitis without complications: Secondary | ICD-10-CM

## 2020-03-15 DIAGNOSIS — T781XXA Other adverse food reactions, not elsewhere classified, initial encounter: Secondary | ICD-10-CM | POA: Diagnosis not present

## 2020-03-15 DIAGNOSIS — I10 Essential (primary) hypertension: Secondary | ICD-10-CM

## 2020-03-15 DIAGNOSIS — M545 Low back pain: Secondary | ICD-10-CM | POA: Diagnosis not present

## 2020-03-15 DIAGNOSIS — F419 Anxiety disorder, unspecified: Secondary | ICD-10-CM | POA: Diagnosis not present

## 2020-03-15 DIAGNOSIS — R000 Tachycardia, unspecified: Secondary | ICD-10-CM | POA: Diagnosis not present

## 2020-03-15 DIAGNOSIS — R69 Illness, unspecified: Secondary | ICD-10-CM | POA: Diagnosis not present

## 2020-03-15 MED ORDER — METOPROLOL SUCCINATE ER 25 MG PO TB24: 25.0000 mg | ORAL_TABLET | Freq: Every day | ORAL | 3 refills | Status: DC

## 2020-03-19 DIAGNOSIS — M545 Low back pain: Secondary | ICD-10-CM | POA: Diagnosis not present

## 2020-03-22 DIAGNOSIS — M545 Low back pain: Secondary | ICD-10-CM | POA: Diagnosis not present

## 2020-03-26 DIAGNOSIS — M545 Low back pain: Secondary | ICD-10-CM | POA: Diagnosis not present

## 2020-03-27 ENCOUNTER — Other Ambulatory Visit: Payer: Self-pay | Admitting: Family Medicine

## 2020-03-27 DIAGNOSIS — J309 Allergic rhinitis, unspecified: Secondary | ICD-10-CM

## 2020-03-27 NOTE — Telephone Encounter (Signed)
Please advise? Medication has never been filled by you.  

## 2020-03-27 NOTE — Telephone Encounter (Signed)
Requested medication (s) are due for refill today:   Yes  Requested medication (s) are on the active medication list:   Yes  Future visit scheduled:   Yes in 2 weeks with Dr. Rosanna Randy   Last ordered: 3 weeks ago by a historical provider.  Clinic note:  Returned because it was prescribed by a historical provider.    Requested Prescriptions  Pending Prescriptions Disp Refills   ALLERGY RELIEF 180 MG tablet [Pharmacy Med Name: ALLERGY RELIEF 180 MG TAB] 30 tablet     Sig: TAKE ONE TABLET EVERY DAY      Ear, Nose, and Throat:  Antihistamines Passed - 03/27/2020 10:45 AM      Passed - Valid encounter within last 12 months    Recent Outpatient Visits           1 week ago Essential hypertension   [facility] Jerrol Banana., MD   5 months ago Annual physical exam   [facility] Jerrol Banana., MD   1 year ago Seasonal allergic rhinitis due to pollen   [facility] Rosanna Randy, Retia Passe., MD   1 year ago Acute non-recurrent maxillary sinusitis   [facility] Trinna Post, [place]   1 year ago Annual physical exam   [facility] Jerrol Banana., MD       Future Appointments             In 2 weeks Jerrol Banana., MD [facility] [place]   In 1 month Harrington Challenger, Carmin Muskrat, MD [place]   In 7 months Rubie Maid, MD [facility]

## 2020-03-28 ENCOUNTER — Telehealth: Payer: Self-pay | Admitting: Family Medicine

## 2020-03-28 ENCOUNTER — Ambulatory Visit: Payer: Self-pay | Admitting: *Deleted

## 2020-03-28 DIAGNOSIS — M545 Low back pain: Secondary | ICD-10-CM | POA: Diagnosis not present

## 2020-03-28 NOTE — Telephone Encounter (Signed)
Just stop metoprolol for now.

## 2020-03-28 NOTE — Telephone Encounter (Signed)
Called to advise patient.

## 2020-03-28 NOTE — Telephone Encounter (Signed)
Summary: Clinical Advice   Patient was newly prescribed metoprolol succinate (TOPROL-XL) 25 MG 24 hr tablet and currently experiencing, fatigue, no sleeping ringing in the ears and irritation to lower leg extremities, seeking clinical advice if she should d/c      Started new medication- 5/13 visit- started 5/14. Patient states her resting HR- 48-64 ( prior 64-72) Not sleeping- even with xanax on board Last 3 days- eating normally- but feels shaky between meals- almost like hypoglycemic effect Constant ringing in ears Starting Sun/Mon- itching on arms/legs- no rash- better today  Patient normally takes medication in morning after breakfast- last does yesterday- did not take today. Will await PCP recommendation.  Reason for Disposition . [1] Caller has URGENT medication question about med that PCP or specialist prescribed AND [2] triager unable to answer question  Answer Assessment - Initial Assessment Questions 1.   NAME of MEDICATION: "What medicine are you calling about?"     Metoprolol succinate 2.   QUESTION: "What is your question?"     Patient is having significant changes/SE since starting new medication 3.   PRESCRIBING HCP: "Who prescribed it?" Reason: if prescribed by specialist, call should be referred to that group.     PCP 4. SYMPTOMS: "Do you have any symptoms?"     insomnia, ringing in ears, "shakey" between meals 5. SEVERITY: If symptoms are present, ask "Are they mild, moderate or severe?"     Moderate/severe- patient has not taken dose for today 6.  PREGNANCY:  "Is there any chance that you are pregnant?" "When was your last menstrual period?"     n/a  Protocols used: MEDICATION QUESTION CALL-A-AH

## 2020-03-28 NOTE — Telephone Encounter (Signed)
Called to advise patient to stop taking metoprolol, LVMTCB.

## 2020-03-28 NOTE — Telephone Encounter (Signed)
Pt wants to know if she should continue taking her metroprolol 25 mg . Her callback number is [phone]

## 2020-03-29 ENCOUNTER — Telehealth: Payer: Self-pay | Admitting: Internal Medicine

## 2020-03-29 NOTE — Telephone Encounter (Signed)
Patient called during Epic downtime. Returned pt call regarding making an appt but left message due to no answer. Pt is currently scheduled for 7/2 w/ Dr. Harrington Challenger.

## 2020-03-30 ENCOUNTER — Ambulatory Visit (INDEPENDENT_AMBULATORY_CARE_PROVIDER_SITE_OTHER): Payer: 59 | Admitting: Internal Medicine

## 2020-03-30 ENCOUNTER — Other Ambulatory Visit: Payer: Self-pay

## 2020-03-30 ENCOUNTER — Encounter: Payer: Self-pay | Admitting: Internal Medicine

## 2020-03-30 VITALS — BP 136/84 | HR 83 | Ht 61.0 in | Wt 180.8 lb

## 2020-03-30 DIAGNOSIS — R002 Palpitations: Secondary | ICD-10-CM | POA: Diagnosis not present

## 2020-03-30 DIAGNOSIS — R079 Chest pain, unspecified: Secondary | ICD-10-CM

## 2020-03-30 MED ORDER — DILTIAZEM HCL 30 MG PO TABS
30.0000 mg | ORAL_TABLET | Freq: Every day | ORAL | 3 refills | Status: DC | PRN
Start: 2020-03-30 — End: 2020-10-22

## 2020-03-30 NOTE — Telephone Encounter (Signed)
Called to see if patient had received the voicemail that I left for her on Wednesday. Patient said that she spoke to pharmacist and was told to only take half of the metoprolol, she also said that she has an appointment today with Cardiologist Dr. Harrington Challenger.

## 2020-03-30 NOTE — Progress Notes (Signed)
Cardiology Office Note   Date:  03/30/2020   ID:  Angela Thomas, DOB 24-Aug-1961, MRN [id number]  PCP:  Angela Banana., MD  Cardiologist:   Dorris Carnes, MD   Pt presents on referral from hospitalist for eval of CP      History of Present Illness: Angela Thomas is a 59 y.o. female with a history of HTN, tachycardia, alpha gal reaction, ulcerative colitis, HL, and chest pain   Followed by Dr Angela Thomas and Dr Angela Thomas   Seen  In ED in early  May 2021 and admitted with CP   Described as "twinges"   Nonexertional  Echo done  Showed LVEF 65 to 62%  Diastolic dysfucntion   Sent home for follow up in cardiology     Patient says she had episodes of tachycardia in past  2012  Seen at [facility] by Dr Nehemiah Massed   Stress test done  Reports that it was negative.   HR 120s/130s resting   Resolved  Given Xanax in past by Dr Angela Thomas but never used.    In March had a spell of heart racing   Took 1/2 Xanax   Resolved in a couple hours.   Again happened in [place]ril a couple times          On  May 6 (Sunday), 2021   Felt tired  WeNt to church  Eyes felt fuzzy   She complained of a mild headache   Felt worse as day went on   BP 170/90    Heart was racing   100  (Normal resting is 60 to 68)  Went to ED    Sensatons of a tickle in chest,   No pain   No SOB    ] After D/C seen by Dr Angela Thomas   Put on Toprol XL 25  Since starting has felt fatigued, ringing in ears, not sleeping well   Cut in 1/2    Pt says before this started she was active, ues walking 2 miles per day, 6 days per week   Now troubled by sciatic issues  Current Meds  Medication Sig  . ALLERGY RELIEF 180 MG tablet TAKE ONE TABLET EVERY DAY  . ALPRAZolam (XANAX) 0.25 MG tablet TAKE ONE TABLET EVERY EIGHT HOURS AS NEEDED FOR ANXIETY  . cycloSPORINE (RESTASIS) 0.05 % ophthalmic emulsion Place 1 drop into both eyes every 12 (twelve) hours.  . Diclofenac-miSOPROStol 75-0.2 MG TBEC TAKE 1 TABLET BY MOUTH TWICE DAILY  . fluticasone (VERAMYST) 27.5  MCG/SPRAY nasal spray Place 2 sprays into the nose daily.  . mesalamine (LIALDA) 1.2 g EC tablet TAKE ONE TABLET TWICE DAILY  . metoprolol succinate (TOPROL-XL) 25 MG 24 hr tablet Take 12.5 mg by mouth daily.  . Omega-3 Fatty Acids (FISH OIL) 1000 MG CAPS Take 1 capsule by mouth in the morning and at bedtime.  Marland Kitchen UNABLE TO FIND Do terra vitality pack  . valACYclovir (VALTREX) 500 MG tablet TAKE ONE TABLET BY MOUTH TWICE DAILY AS NEEDED     Allergies:   Beef-derived products, Other, and Pork-derived products   Past Medical History:  Diagnosis Date  . Cancer [facility])    Endometrial  . Colitis   . Colitis   . DDD (degenerative disc disease), lumbar     Past Surgical History:  Procedure Laterality Date  . ABDOMINAL HYSTERECTOMY     total  . BREAST CYST ASPIRATION Bilateral    neg  . BUNIONECTOMY    .  BUNIONECTOMY     With hammer toe repair  . COLONOSCOPY N/A 06/15/2017   Procedure: COLONOSCOPY;  Surgeon: Lin Landsman, MD;  Location: [facility] ENDOSCOPY;  Service: Endoscopy;  Laterality: N/A;  . CYST REMOVAL NECK    . HAMMER TOE SURGERY    . TONSILLECTOMY AND ADENOIDECTOMY       Social History:  The patient  reports that she has never smoked. She has never used smokeless tobacco. She reports current alcohol use of about 1.0 standard drinks of alcohol per week. She reports that she does not use drugs.   Family History:  The patient's family history includes Bladder Cancer in her father; Breast cancer in her maternal aunt; COPD in her father and mother; Cancer in her maternal uncle; Dementia in her maternal aunt; Diabetes in her father and paternal grandfather; Heart disease in her maternal grandfather and maternal uncle; Other in her paternal grandmother; Prostate cancer in her paternal grandfather; Pulmonary fibrosis in her father; Transient ischemic attack in her maternal uncle.    ROS:  Please see the history of present illness. All other systems are reviewed and  Negative to the  above problem except as noted.    PHYSICAL EXAM: VS:  BP 136/84   Pulse 83   Ht 5' 1"  (1.549 m)   Wt 180 lb 12.8 oz (82 kg)   SpO2 (!) 66%   BMI 34.16 kg/m   GEN: Obese 59 yo  in no acute distress  HEENT: normal  Neck: no JVD, carotid bruits Cardiac: RRR; no murmurs, rubs, or gallops,no LE  edema  Respiratory:  clear to auscultation bilaterally, normal work of breathing GI: soft, nontender, nondistended, + BS  No hepatomegaly  MS: no deformity Moving all extremities   Skin: warm and dry, no rash Neuro:  Strength and sensation are intact Psych: euthymic mood, full affect   EKG:  EKG is not ordered today.  On 5/13  SR 89 bpm  Nonspecific ST changes     Lipid Panel    Component Value Date/Time   CHOL 222 (H) 03/05/2020 0723   CHOL 232 (H) 10/20/2019 0920   TRIG 69 03/05/2020 0723   HDL 57 03/05/2020 0723   HDL 58 10/20/2019 0920   CHOLHDL 3.9 03/05/2020 0723   VLDL 14 03/05/2020 0723   LDLCALC 151 (H) 03/05/2020 0723   LDLCALC 159 (H) 10/20/2019 0920      Wt Readings from Last 3 Encounters:  03/30/20 180 lb 12.8 oz (82 kg)  03/15/20 181 lb 3.2 oz (82.2 kg)  03/04/20 180 lb (81.6 kg)      ASSESSMENT AND PLAN:  1.   Spells.  Pt complains of chest "Tickle" as well as heart raceing    Note her BP was high on visit to ED I am not convinced spells are angina or significant arrhythmia   Unfort now activity is limited by leg pains. I would recomm that the pt track her BP and HR.   Symptoms are so erratic taht it would be hard to capture on an event monitor  Asked if she could borrow an Apple 4-6 watch.    I also would recomm trial of low dose cardiazem 30 mg 2 to 4x per day  2.  Cardiac risk stratification.   Pt's cholesterol panel is not optimal   Discussed diet.   Would recomm a calcium score CT to better risk stratify pt, particularly with above spells   Plan for f/u in 6 wks  Bring  log of BP and HR as well as BP cuff to appt  Current medicines are reviewed at  length with the patient today.  The patient does not have concerns regarding medicines.  Signed, Dorris Carnes, MD  03/30/2020 1:41 PM    [facility] [place], [place], [place]  [address] Phone: [phone]; Fax: [phone]

## 2020-03-30 NOTE — Patient Instructions (Signed)
Medication Instructions:  Your physician has recommended you make the following change in your medication:  1-STOP metoprolol 2-START Cardizem 30 mg by mouth as needed for palpitations  *If you need a refill on your cardiac medications before your next appointment, please call your pharmacy*  Lab Work: If you have labs (blood work) drawn today and your tests are completely normal, you will receive your results only by: Marland Kitchen MyChart Message (if you have MyChart) OR . A paper copy in the mail If you have any lab test that is abnormal or we need to change your treatment, we will call you to review the results.  Testing/Procedures: Non-Cardiac CT scanning, (CAT scanning), is a noninvasive, special x-ray that produces cross-sectional images of the body using x-rays and a computer. CT scans help physicians diagnose and treat medical conditions. For some CT exams, a contrast material is used to enhance visibility in the area of the body being studied. CT scans provide greater clarity and reveal more details than regular x-ray exams.  Follow-Up: At [facility], you and your health needs are our priority.  As part of our continuing mission to provide you with exceptional heart care, we have created designated Provider Care Teams.  These Care Teams include your primary Cardiologist (physician) and Advanced Practice Providers (APPs -  Physician Assistants and Nurse Practitioners) who all work together to provide you with the care you need, when you need it.  We recommend signing up for the patient portal called "MyChart".  Sign up information is provided on this After Visit Summary.  MyChart is used to connect with patients for Virtual Visits (Telemedicine).  Patients are able to view lab/test results, encounter notes, upcoming appointments, etc.  Non-urgent messages can be sent to your provider as well.   To learn more about what you can do with MyChart, go to [email or web address].    Your next  appointment:   6 week(s)  The format for your next appointment:   In Person  Provider:   You may see Dr. Harrington Challenger or one of the following Advanced Practice Providers on your designated Care Team:    Richardson Dopp, PA-C  [place], [place]

## 2020-04-03 ENCOUNTER — Telehealth: Payer: Self-pay | Admitting: Internal Medicine

## 2020-04-03 ENCOUNTER — Encounter: Payer: Self-pay | Admitting: Family Medicine

## 2020-04-03 ENCOUNTER — Telehealth: Payer: Self-pay

## 2020-04-03 DIAGNOSIS — M545 Low back pain: Secondary | ICD-10-CM | POA: Diagnosis not present

## 2020-04-03 DIAGNOSIS — G4700 Insomnia, unspecified: Secondary | ICD-10-CM

## 2020-04-03 DIAGNOSIS — R002 Palpitations: Secondary | ICD-10-CM

## 2020-04-03 MED ORDER — TRAZODONE HCL 50 MG PO TABS: 50.0000 mg | ORAL_TABLET | Freq: Every day | ORAL | 11 refills | Status: DC

## 2020-04-03 NOTE — Telephone Encounter (Signed)
Pt wrote in about heart racing, BP up as well She can take diltiazem 4 x per day Can place on long acting if BP responds and she feels better/heart rates controlle  Given that she is having more spells, I would have her set up for a 2 week event moniitor  When wearing monitor I would hold diltiazem to try to document what she is feeling

## 2020-04-03 NOTE — Telephone Encounter (Signed)
Patient advised that medication has been sent to pharmacy.

## 2020-04-04 ENCOUNTER — Telehealth: Payer: Self-pay

## 2020-04-04 ENCOUNTER — Telehealth: Payer: Self-pay | Admitting: Internal Medicine

## 2020-04-04 ENCOUNTER — Telehealth: Payer: Self-pay | Admitting: Radiology

## 2020-04-04 ENCOUNTER — Encounter: Payer: Self-pay | Admitting: Family Medicine

## 2020-04-04 NOTE — Telephone Encounter (Signed)
Enrolled patient for a 14 day [employer] Event Monitor to be mailed to patients home.

## 2020-04-04 NOTE — Addendum Note (Signed)
Addended by: Thompson Grayer on: 04/04/2020 09:18 AM   Modules accepted: Orders

## 2020-04-04 NOTE — Telephone Encounter (Signed)
Patient notified. I told her monitor would be mailed to her.  She will be on vacation the week of June 20 and would prefer not to wear the monitor at that time.

## 2020-04-04 NOTE — Telephone Encounter (Signed)
I spoke with patient and she would like to wear monitor as soon as possible.  I told patient I would send message to our monitor department

## 2020-04-04 NOTE — Telephone Encounter (Signed)
Copied from [place] [id number]. Topic: General - Inquiry >> Apr 04, 2020  2:59 PM Mathis Bud wrote: Reason for CRM: patient sent mychart message to PCP regarding new medication trazodone.  Patient states she still has not slept and is wondering if she needs something stronger or what PCP would recommended.   Call back [phone]

## 2020-04-04 NOTE — Telephone Encounter (Signed)
New Message  The pt called and wanted to talk with Fraser Din regarding the message about the heart monitor. She says go ahead and order the monitor and she'll put in on as soon as it comes.

## 2020-04-05 DIAGNOSIS — M545 Low back pain: Secondary | ICD-10-CM | POA: Diagnosis not present

## 2020-04-05 NOTE — Telephone Encounter (Signed)
Please advise 

## 2020-04-05 NOTE — Telephone Encounter (Signed)
Try 2 trazedone nightly for now.

## 2020-04-05 NOTE — Telephone Encounter (Signed)
Patient was advised. Expressed understanding.

## 2020-04-06 ENCOUNTER — Other Ambulatory Visit: Payer: Self-pay | Admitting: Nurse Practitioner

## 2020-04-06 DIAGNOSIS — M5489 Other dorsalgia: Secondary | ICD-10-CM | POA: Diagnosis not present

## 2020-04-06 DIAGNOSIS — M5442 Lumbago with sciatica, left side: Secondary | ICD-10-CM

## 2020-04-06 DIAGNOSIS — M5430 Sciatica, unspecified side: Secondary | ICD-10-CM

## 2020-04-06 DIAGNOSIS — M545 Low back pain: Secondary | ICD-10-CM | POA: Diagnosis not present

## 2020-04-06 DIAGNOSIS — M5441 Lumbago with sciatica, right side: Secondary | ICD-10-CM | POA: Diagnosis not present

## 2020-04-09 ENCOUNTER — Other Ambulatory Visit: Payer: Self-pay

## 2020-04-09 ENCOUNTER — Ambulatory Visit (INDEPENDENT_AMBULATORY_CARE_PROVIDER_SITE_OTHER)
Admission: RE | Admit: 2020-04-09 | Discharge: 2020-04-09 | Disposition: A | Discharge status: Home / Self Care | Payer: Self-pay | Source: Ambulatory Visit | Attending: Internal Medicine | Admitting: Internal Medicine

## 2020-04-09 ENCOUNTER — Encounter: Payer: Self-pay | Admitting: Family Medicine

## 2020-04-09 DIAGNOSIS — R079 Chest pain, unspecified: Secondary | ICD-10-CM

## 2020-04-09 DIAGNOSIS — R002 Palpitations: Secondary | ICD-10-CM

## 2020-04-10 ENCOUNTER — Ambulatory Visit: Payer: Self-pay | Admitting: Family Medicine

## 2020-04-10 DIAGNOSIS — M545 Low back pain: Secondary | ICD-10-CM | POA: Diagnosis not present

## 2020-04-16 ENCOUNTER — Telehealth: Payer: Self-pay | Admitting: *Deleted

## 2020-04-16 DIAGNOSIS — I700 Atherosclerosis of aorta: Secondary | ICD-10-CM

## 2020-04-16 MED ORDER — ROSUVASTATIN CALCIUM 20 MG PO TABS
20.0000 mg | ORAL_TABLET | Freq: Every day | ORAL | 3 refills | Status: DC
Start: 2020-04-16 — End: 2020-05-15

## 2020-04-16 NOTE — Telephone Encounter (Signed)
I spoke with the patient.  She is aware to begin Crestor 20 mg daily.  She was scheduled follow up in [place] with Dr. Harrington Challenger.  Would prefer to come to [facility] so I rescheduled her follow up.   Also asked if Crestor would have any interaction with mesalamine.  Checked Micro medex and found no drug-drug interaction.  Sent prescription to [facility] w plan to check labs in 8 weeks.

## 2020-04-16 NOTE — Telephone Encounter (Signed)
-----   Message from Fay Records, MD sent at 04/11/2020  4:52 PM EDT ----- Reviewed CT findnigs with patient Mild plaquing     Score 76    REcomm:   Crestor 20 mg  daily Check lipids and CK, AST in 8 wk   Pt says she is feeling good   Rare palpitations (mild)  no CP  Breathing is OK

## 2020-04-17 ENCOUNTER — Other Ambulatory Visit: Payer: Self-pay | Admitting: Family Medicine

## 2020-04-17 ENCOUNTER — Encounter: Payer: Self-pay | Admitting: Family Medicine

## 2020-04-17 ENCOUNTER — Encounter: Payer: Self-pay | Admitting: *Deleted

## 2020-04-17 DIAGNOSIS — M545 Low back pain: Secondary | ICD-10-CM | POA: Diagnosis not present

## 2020-04-17 DIAGNOSIS — G4700 Insomnia, unspecified: Secondary | ICD-10-CM

## 2020-04-17 MED ORDER — TRAZODONE HCL 50 MG PO TABS: 50.0000 mg | ORAL_TABLET | Freq: Every day | ORAL | 11 refills | Status: DC

## 2020-04-17 NOTE — Progress Notes (Signed)
Patient ID: Angela Thomas, female   DOB: 1961-10-26, 59 y.o.   MRN: [id number] Received staff message patient has still not received [employer] monitor.  Dr. Harrington Challenger instructed patient not to worry about wearing so patient will send back when it reaches her.  Monitor has finally shipped and is due to be delivered 04/17/2020.  [employer] sent message to cancel enrollment and that patient will be returning monitor.

## 2020-04-17 NOTE — Telephone Encounter (Signed)
Pt states since the traZODone (DESYREL) 50 MG tablet Was prescribed, she has increased this to 75 mg /night.  Pt states dr Rosanna Randy gave her a lead way to take up to 75 mg. Pt has run out early, pt needs new Rx sent to [place], [place] Phone:  [phone]  Fax:  [phone]      Pt prefers to stay with the 50 mg tab in case she decides to do just 50 mg.  Pt is currently taking 1 & 1/2 tab/nightly.

## 2020-04-19 ENCOUNTER — Other Ambulatory Visit: Payer: Self-pay | Admitting: Family Medicine

## 2020-04-19 DIAGNOSIS — G4700 Insomnia, unspecified: Secondary | ICD-10-CM

## 2020-04-19 MED ORDER — TRAZODONE HCL 100 MG PO TABS: 100.0000 mg | ORAL_TABLET | Freq: Every day | ORAL | 5 refills | Status: DC

## 2020-04-19 NOTE — Telephone Encounter (Signed)
Refill sent to pharmacy.   

## 2020-04-19 NOTE — Telephone Encounter (Signed)
Ok to change. thx

## 2020-04-19 NOTE — Telephone Encounter (Signed)
Patient called to request that her script be changed to 100 MG and said the pharmacy said it was not changed.  Please advise and let patient know when it is corrected.  CB# [phone]

## 2020-04-19 NOTE — Addendum Note (Signed)
Addended by: Gerald Stabs on: 04/19/2020 03:40 PM   Modules accepted: Orders

## 2020-04-20 ENCOUNTER — Ambulatory Visit: Payer: 59

## 2020-05-01 ENCOUNTER — Ambulatory Visit: Payer: 59

## 2020-05-01 DIAGNOSIS — M545 Low back pain: Secondary | ICD-10-CM | POA: Diagnosis not present

## 2020-05-04 ENCOUNTER — Ambulatory Visit: Payer: 59 | Admitting: Internal Medicine

## 2020-05-11 ENCOUNTER — Ambulatory Visit: Payer: 59 | Admitting: Internal Medicine

## 2020-05-14 ENCOUNTER — Telehealth: Payer: Self-pay | Admitting: Internal Medicine

## 2020-05-14 NOTE — Telephone Encounter (Signed)
Fwd to [facility] triage.

## 2020-05-14 NOTE — Telephone Encounter (Signed)
Pt called to cancel 05-17-20 appt. Pt states she does not plan to take Crestor   If needed call [phone]   Thanks renee

## 2020-05-15 NOTE — Telephone Encounter (Signed)
Has not had any further tachycardia episodes.  BP much better 120-130 / 70s..  Back pain/sciatica improved.    Has not started Crestor and is not going to try it at this time. Adv of concern for lipid management with LDL not optimal and calcium score abnormal.   She is back to fast walking 2 miles daily and is eating strict mediterranean diet.  Would prefer to do this before starting medication.  She will call sooner if needed, otherwise I will put her in for a 6 month recall.

## 2020-05-16 ENCOUNTER — Ambulatory Visit: Payer: 59

## 2020-05-17 ENCOUNTER — Ambulatory Visit: Payer: 59 | Admitting: Internal Medicine

## 2020-05-17 DIAGNOSIS — M545 Low back pain: Secondary | ICD-10-CM | POA: Diagnosis not present

## 2020-05-21 DIAGNOSIS — M545 Low back pain: Secondary | ICD-10-CM | POA: Diagnosis not present

## 2020-06-04 DIAGNOSIS — M545 Low back pain: Secondary | ICD-10-CM | POA: Diagnosis not present

## 2020-06-11 DIAGNOSIS — M545 Low back pain: Secondary | ICD-10-CM | POA: Diagnosis not present

## 2020-06-18 DIAGNOSIS — M545 Low back pain: Secondary | ICD-10-CM | POA: Diagnosis not present

## 2020-06-25 DIAGNOSIS — M545 Low back pain: Secondary | ICD-10-CM | POA: Diagnosis not present

## 2020-06-29 NOTE — Progress Notes (Signed)
I,[name],acting as a scribe for [name], MD.,have documented all relevant documentation on the behalf of [name], MD,as directed by  [name], MD while in the presence of [name], MD.   Established patient visit   Patient: Angela Thomas   DOB: June 09, 1961   59 y.o. Female  MRN: [id number] Visit Date: 07/03/2020  Today's healthcare provider: [name], MD   Chief Complaint  Patient presents with  . Abdominal Pain   Subjective    HPI   Patient states 3 weeks ago she had abdominal pain/tightness. Patient states several days later she began having clay colored stools and diarrhea. Patient states her stools are normal now after taking probiotics for 3 days. Patient is still having intermittent pain in her abdomen.    Pain is in the epigastric and right upper quadrant area.  Seems to radiate to her right thoracic back.  She does not know if is related to food. He has had no weight loss but is having some problems with insomnia.      Medications: Outpatient Medications Prior to Visit  Medication Sig  . ALLERGY RELIEF 180 MG tablet TAKE ONE TABLET EVERY DAY  . cycloSPORINE (RESTASIS) 0.05 % ophthalmic emulsion Place 1 drop into both eyes every 12 (twelve) hours.  . Diclofenac-miSOPROStol 75-0.2 MG TBEC TAKE 1 TABLET BY MOUTH TWICE DAILY  . diltiazem (CARDIZEM) 30 MG tablet Take 1 tablet (30 mg total) by mouth daily as needed (palpitations).  . fluticasone (VERAMYST) 27.5 MCG/SPRAY nasal spray Place 2 sprays into the nose daily.  . mesalamine (LIALDA) 1.2 g EC tablet TAKE ONE TABLET TWICE DAILY  . NON FORMULARY Over-The-Counter Beet Supplement 2 capsules daily  . Omega-3 Fatty Acids (FISH OIL) 1000 MG CAPS Take 1 capsule by mouth in the morning and at bedtime.  Marland Kitchen UNABLE TO FIND Do terra vitality pack  . valACYclovir (VALTREX) 500 MG tablet TAKE ONE TABLET BY MOUTH TWICE DAILY AS NEEDED  . [DISCONTINUED] traZODone (DESYREL)  100 MG tablet Take 1 tablet (100 mg total) by mouth at bedtime.  . ALPRAZolam (XANAX) 0.25 MG tablet TAKE ONE TABLET EVERY EIGHT HOURS AS NEEDED FOR ANXIETY (Patient not taking: Reported on 07/03/2020)   No facility-administered medications prior to visit.    Review of Systems  Constitutional: Negative for appetite change, chills, fatigue and fever.  Respiratory: Negative for chest tightness and shortness of breath.   Cardiovascular: Negative for chest pain and palpitations.  Gastrointestinal: Negative for abdominal pain, nausea and vomiting.  Neurological: Negative for dizziness and weakness.       Objective    BP 110/70 (BP Location: Right Arm, Patient Position: Sitting, Cuff Size: Large)   Pulse 76   Temp 98.8 F (37.1 C) (Oral)   Resp 16   Ht 5' 1"  (1.549 m)   Wt 175 lb (79.4 kg)   SpO2 98%   BMI 33.07 kg/m  Wt Readings from Last 3 Encounters:  07/03/20 175 lb (79.4 kg)  03/30/20 180 lb 12.8 oz (82 kg)  03/15/20 181 lb 3.2 oz (82.2 kg)      Physical Exam   General Appearance:    Mildly obese female. Alert, cooperative, in no acute distress, appears stated age   Head:    Normocephalic, without obvious abnormality, atraumatic  Eyes:    PERRL, conjunctiva/corneas clear, EOM's intact, fundi    benign, both eyes  Ears:    Normal TM's and external ear canals, both  ears  Nose:   Nares normal, septum midline, mucosa normal, no drainage    or sinus tenderness  Throat:   Lips, mucosa, and tongue normal; teeth and gums normal  Neck:   Supple, symmetrical, trachea midline, no adenopathy;    thyroid:  no enlargement/tenderness/nodules; no carotid   bruit or JVD  Back:     Symmetric, no curvature, ROM normal, no CVA tenderness  Lungs:     Clear to auscultation bilaterally, respirations unlabored  Chest Wall:    No tenderness or deformity   Heart:    Normal heart rate. Normal rhythm. No murmurs, rubs, or gallops.   Breast Exam:    deferred  Abdomen:     Soft, non-tender, bowel  sounds active all four quadrants,    no masses, no organomegaly  Pelvic:    deferred  Extremities:   All extremities are intact. No cyanosis or edema  Pulses:   2+ and symmetric all extremities  Skin:   Skin color, texture, turgor normal, no rashes or lesions  Lymph nodes:   Cervical, supraclavicular, and axillary nodes normal  Neurologic:   CNII-XII intact, normal strength, sensation and reflexes    throughout   Mild epigastric tenderness.  Negative Murphy sign.  No mass-effect.  No results found for any visits on 07/03/20.  Assessment & Plan     1. Insomnia, unspecified type  - traZODone (DESYREL) 100 MG tablet; Take 1 tablet (100 mg total) by mouth at bedtime. Take 1 1/2 tablets at bedtime  Dispense: 45 tablet; Refill: 4  2. Ulcerative pancolitis without complication (HCC)  - CBC w/Diff/Platelet - Comprehensive Metabolic Panel (CMET) - omeprazole (PRILOSEC) 20 MG capsule; Take 1 capsule (20 mg total) by mouth every morning.  Dispense: 30 capsule; Refill: 5 - Lipid panel - US Abdomen Limited  3. Hyperlipidemia, unspecified hyperlipidemia type  - CBC w/Diff/Platelet - Comprehensive Metabolic Panel (CMET) - Lipid panel  4. Essential hypertension  - CBC w/Diff/Platelet - Comprehensive Metabolic Panel (CMET) - Lipid panel .  5.  Epigastric pain Omeprazole 20 mg every morning 6.  Right upper quadrant pain Keep journal on timing of pain related to food.  Pain gallbladder ultrasound  Return in about 2 weeks (around 07/17/2020).         Yola Paradiso Cranford Mon, MD  [facility] [phone] (phone) [phone] (fax)  [place]

## 2020-07-02 DIAGNOSIS — M545 Low back pain: Secondary | ICD-10-CM | POA: Diagnosis not present

## 2020-07-03 ENCOUNTER — Other Ambulatory Visit: Payer: Self-pay

## 2020-07-03 ENCOUNTER — Ambulatory Visit: Payer: 59 | Admitting: Family Medicine

## 2020-07-03 ENCOUNTER — Encounter: Payer: Self-pay | Admitting: Family Medicine

## 2020-07-03 VITALS — BP 110/70 | HR 76 | Temp 98.8°F | Resp 16 | Ht 61.0 in | Wt 175.0 lb

## 2020-07-03 DIAGNOSIS — I10 Essential (primary) hypertension: Secondary | ICD-10-CM | POA: Diagnosis not present

## 2020-07-03 DIAGNOSIS — G4700 Insomnia, unspecified: Secondary | ICD-10-CM

## 2020-07-03 DIAGNOSIS — R1013 Epigastric pain: Secondary | ICD-10-CM

## 2020-07-03 DIAGNOSIS — K5100 Ulcerative (chronic) pancolitis without complications: Secondary | ICD-10-CM

## 2020-07-03 DIAGNOSIS — E785 Hyperlipidemia, unspecified: Secondary | ICD-10-CM | POA: Diagnosis not present

## 2020-07-03 DIAGNOSIS — R1011 Right upper quadrant pain: Secondary | ICD-10-CM | POA: Diagnosis not present

## 2020-07-03 MED ORDER — OMEPRAZOLE 20 MG PO CPDR: 20.0000 mg | DELAYED_RELEASE_CAPSULE | ORAL | 5 refills | Status: DC

## 2020-07-03 MED ORDER — TRAZODONE HCL 100 MG PO TABS: 100.0000 mg | ORAL_TABLET | Freq: Every day | ORAL | 4 refills | Status: DC

## 2020-07-04 ENCOUNTER — Telehealth: Payer: Self-pay

## 2020-07-04 DIAGNOSIS — K5100 Ulcerative (chronic) pancolitis without complications: Secondary | ICD-10-CM | POA: Diagnosis not present

## 2020-07-04 DIAGNOSIS — E785 Hyperlipidemia, unspecified: Secondary | ICD-10-CM | POA: Diagnosis not present

## 2020-07-04 DIAGNOSIS — I10 Essential (primary) hypertension: Secondary | ICD-10-CM | POA: Diagnosis not present

## 2020-07-04 NOTE — Telephone Encounter (Signed)
Copied from [place] [id number]. Topic: General - Other >> Jul 03, 2020  5:19 PM Yvette Rack wrote: Reason for CRM: [facility] called for clarification of the instructions for the Rx for traZODone (DESYREL) 100 MG tablet.

## 2020-07-04 NOTE — Telephone Encounter (Signed)
Will advise pharmacy that patient is supposed to be taking 1 1/2 tablet at bedtime.

## 2020-07-05 LAB — COMPREHENSIVE METABOLIC PANEL
ALT: 21 IU/L (ref 0–32)
AST: 20 IU/L (ref 0–40)
Albumin/Globulin Ratio: 1.9 (ref 1.2–2.2)
Albumin: 4.4 g/dL (ref 3.8–4.8)
Alkaline Phosphatase: 79 IU/L (ref 48–121)
BUN/Creatinine Ratio: 15 (ref 12–28)
BUN: 9 mg/dL (ref 8–27)
Bilirubin Total: 0.3 mg/dL (ref 0.0–1.2)
CO2: 26 mmol/L (ref 20–29)
Calcium: 9.9 mg/dL (ref 8.7–10.3)
Chloride: 101 mmol/L (ref 96–106)
Creatinine, Ser: 0.62 mg/dL (ref 0.57–1.00)
GFR calc Af Amer: 113 mL/min/{1.73_m2} (ref >59)
GFR calc non Af Amer: 98 mL/min/{1.73_m2} (ref >59)
Globulin, Total: 2.3 g/dL (ref 1.5–4.5)
Glucose: 92 mg/dL (ref 65–99)
Potassium: 4.6 mmol/L (ref 3.5–5.2)
Sodium: 142 mmol/L (ref 134–144)
Total Protein: 6.7 g/dL (ref 6.0–8.5)

## 2020-07-05 LAB — CBC WITH DIFFERENTIAL/PLATELET
Basophils Absolute: 0 10*3/uL (ref 0.0–0.2)
Basos: 1 %
EOS (ABSOLUTE): 0.1 10*3/uL (ref 0.0–0.4)
Eos: 2 %
Hematocrit: 39.8 % (ref 34.0–46.6)
Hemoglobin: 13.4 g/dL (ref 11.1–15.9)
Immature Grans (Abs): 0 10*3/uL (ref 0.0–0.1)
Immature Granulocytes: 0 %
Lymphocytes Absolute: 1 10*3/uL (ref 0.7–3.1)
Lymphs: 25 %
MCH: 32 pg (ref 26.6–33.0)
MCHC: 33.7 g/dL (ref 31.5–35.7)
MCV: 95 fL (ref 79–97)
Monocytes Absolute: 0.5 10*3/uL (ref 0.1–0.9)
Monocytes: 12 %
Neutrophils Absolute: 2.5 10*3/uL (ref 1.4–7.0)
Neutrophils: 60 %
Platelets: 289 10*3/uL (ref 150–450)
RBC: 4.19 x10E6/uL (ref 3.77–5.28)
RDW: 12.2 % (ref 11.7–15.4)
WBC: 4.1 10*3/uL (ref 3.4–10.8)

## 2020-07-05 LAB — LIPID PANEL
Chol/HDL Ratio: 3.8 ratio (ref 0.0–4.4)
Cholesterol, Total: 214 mg/dL — ABNORMAL HIGH (ref 100–199)
HDL: 57 mg/dL (ref >39)
LDL Chol Calc (NIH): 140 mg/dL — ABNORMAL HIGH (ref 0–99)
Triglycerides: 96 mg/dL (ref 0–149)
VLDL Cholesterol Cal: 17 mg/dL (ref 5–40)

## 2020-07-10 ENCOUNTER — Ambulatory Visit
Admission: RE | Admit: 2020-07-10 | Discharge: 2020-07-10 | Disposition: A | Discharge status: Home / Self Care | Payer: 59 | Source: Ambulatory Visit | Attending: Family Medicine | Admitting: Family Medicine

## 2020-07-10 ENCOUNTER — Other Ambulatory Visit: Payer: Self-pay

## 2020-07-10 ENCOUNTER — Telehealth: Payer: Self-pay

## 2020-07-10 DIAGNOSIS — K5100 Ulcerative (chronic) pancolitis without complications: Secondary | ICD-10-CM | POA: Insufficient documentation

## 2020-07-10 DIAGNOSIS — R1011 Right upper quadrant pain: Secondary | ICD-10-CM | POA: Diagnosis not present

## 2020-07-10 NOTE — Telephone Encounter (Signed)
-----   Message from Jerrol Banana., MD sent at 07/10/2020  9:22 AM EDT ----- Labs stable.

## 2020-07-10 NOTE — Telephone Encounter (Signed)
Seen by patient Angela Thomas on 07/10/2020 10:25 AM

## 2020-07-11 ENCOUNTER — Telehealth: Payer: Self-pay

## 2020-07-11 DIAGNOSIS — M545 Low back pain: Secondary | ICD-10-CM | POA: Diagnosis not present

## 2020-07-11 NOTE — Telephone Encounter (Signed)
-----   Message from Jerrol Banana., MD sent at 07/10/2020 12:39 PM EDT ----- Normal gallbladder ultrasound

## 2020-07-11 NOTE — Telephone Encounter (Signed)
Patient given lab results through mychart and has read the providers comments.  

## 2020-07-16 NOTE — Progress Notes (Deleted)
     Established patient visit   Patient: Angela Thomas   DOB: 05-21-1961   59 y.o. Female  MRN: [id number] Visit Date: 07/17/2020  Today's healthcare provider: [name], MD   No chief complaint on file.  Subjective    HPI  Ulcerative pancolitis without complication ([place]) From 3/56/8616-OHFG and US abdomin with normal limits.  Epigastric pain From 07/03/2020-Omeprazole 20 mg every morning.  Right upper quadrant pain From 07/03/2020-Keep journal on timing of pain related to food.  Pain gallbladder ultrasound.  {Show patient history (optional):23778::" "}   Medications: Outpatient Medications Prior to Visit  Medication Sig  . ALLERGY RELIEF 180 MG tablet TAKE ONE TABLET EVERY DAY  . ALPRAZolam (XANAX) 0.25 MG tablet TAKE ONE TABLET EVERY EIGHT HOURS AS NEEDED FOR ANXIETY (Patient not taking: Reported on 07/03/2020)  . cycloSPORINE (RESTASIS) 0.05 % ophthalmic emulsion Place 1 drop into both eyes every 12 (twelve) hours.  . Diclofenac-miSOPROStol 75-0.2 MG TBEC TAKE 1 TABLET BY MOUTH TWICE DAILY  . diltiazem (CARDIZEM) 30 MG tablet Take 1 tablet (30 mg total) by mouth daily as needed (palpitations).  . fluticasone (VERAMYST) 27.5 MCG/SPRAY nasal spray Place 2 sprays into the nose daily.  . mesalamine (LIALDA) 1.2 g EC tablet TAKE ONE TABLET TWICE DAILY  . NON FORMULARY Over-The-Counter Beet Supplement 2 capsules daily  . Omega-3 Fatty Acids (FISH OIL) 1000 MG CAPS Take 1 capsule by mouth in the morning and at bedtime.  Marland Kitchen omeprazole (PRILOSEC) 20 MG capsule Take 1 capsule (20 mg total) by mouth every morning.  . traZODone (DESYREL) 100 MG tablet Take 1 tablet (100 mg total) by mouth at bedtime. Take 1 1/2 tablets at bedtime  . UNABLE TO FIND Do terra vitality pack  . valACYclovir (VALTREX) 500 MG tablet TAKE ONE TABLET BY MOUTH TWICE DAILY AS NEEDED   No facility-administered medications prior to visit.    Review of Systems  Constitutional: Negative for appetite  change, chills, fatigue and fever.  Respiratory: Negative for chest tightness and shortness of breath.   Cardiovascular: Negative for chest pain and palpitations.  Gastrointestinal: Negative for abdominal pain, nausea and vomiting.  Neurological: Negative for dizziness and weakness.    {Heme  Chem  Endocrine  Serology  Results Review (optional):23779::" "}  Objective    There were no vitals taken for this visit. {Show previous vital signs (optional):23777::" "}  Physical Exam  ***  No results found for any visits on 07/17/20.  Assessment & Plan     ***  No follow-ups on file.      {provider attestation***:1}   [name], MD  [facility] [phone] (phone) [phone] (fax)  [place]

## 2020-07-17 ENCOUNTER — Ambulatory Visit: Payer: Self-pay | Admitting: Family Medicine

## 2020-07-18 DIAGNOSIS — M545 Low back pain: Secondary | ICD-10-CM | POA: Diagnosis not present

## 2020-07-23 DIAGNOSIS — M545 Low back pain: Secondary | ICD-10-CM | POA: Diagnosis not present

## 2020-07-25 ENCOUNTER — Other Ambulatory Visit: Payer: Self-pay | Admitting: Gastroenterology

## 2020-07-25 DIAGNOSIS — K5100 Ulcerative (chronic) pancolitis without complications: Secondary | ICD-10-CM

## 2020-07-26 DIAGNOSIS — K5100 Ulcerative (chronic) pancolitis without complications: Secondary | ICD-10-CM

## 2020-07-26 MED ORDER — MESALAMINE 1.2 G PO TBEC: 1.2000 g | DELAYED_RELEASE_TABLET | Freq: Two times a day (BID) | ORAL | 0 refills | Status: DC

## 2020-07-26 NOTE — Telephone Encounter (Signed)
Patient called and scheduled a follow up for 12.7.21. Please send in refill until appt date.

## 2020-07-26 NOTE — Telephone Encounter (Signed)
Last office visit 09/20/2019 Ulcerative pancolitis  Last refill 01/25/2020 0 refills  Supposed to follow up in 6 months but did not  [employer]

## 2020-07-30 DIAGNOSIS — M545 Low back pain: Secondary | ICD-10-CM | POA: Diagnosis not present

## 2020-08-03 ENCOUNTER — Telehealth: Payer: Self-pay | Admitting: Internal Medicine

## 2020-08-03 NOTE — Telephone Encounter (Signed)
BP has been 160s/80s last 3 days. Her head is fuzzy and she is weak as water.  Took dilitiazem x 2 today and most recent BP is 138/80. Feels pain plays a part for her BP but back pain was worse beginning of week compared to now.  No other meds for BP. HR has been stable 60s-80s.  Aware I will forward to Dr. Harrington Challenger.  We can call her back w recommendations or send a MyChart message if unable to reach by phone.

## 2020-08-03 NOTE — Telephone Encounter (Signed)
°  Pt c/o BP issue: STAT if pt c/o blurred vision, one-sided weakness or slurred speech  1. What are your last 5 BP readings?   162/86 been around this number for the last three days  2. Are you having any other symptoms (ex. Dizziness, headache, blurred vision, passed out)? Headache, weak  3. What is your BP issue? Over the past three days she has been having some high BP readings with a headache and general weakness. She states she has medication that Dr Harrington Challenger previously gave her but she needs instruction on taking it.

## 2020-08-05 NOTE — Telephone Encounter (Signed)
I would recomm longer acting diltiazem 180 mg CD per day    FOllow BP and HR

## 2020-08-06 MED ORDER — DILTIAZEM HCL ER COATED BEADS 180 MG PO CP24: 180.0000 mg | ORAL_CAPSULE | Freq: Every day | ORAL | 3 refills | Status: DC

## 2020-08-06 NOTE — Telephone Encounter (Signed)
Pt advised and says that her BP this weekend has been 118/70, 120/66... she says she has been feeling much better... I advised her to continue to monitor and to let us know... she requested that I go ahead and call in the Diltiazem just in case she needs it and she will let us know if she picks it up and starts it.

## 2020-08-16 NOTE — Telephone Encounter (Signed)
Pt should start Cardiazem 180 mg   Follow BP and HR

## 2020-08-23 ENCOUNTER — Ambulatory Visit
Admission: RE | Admit: 2020-08-23 | Discharge: 2020-08-23 | Disposition: A | Discharge status: Home / Self Care | Payer: 59 | Source: Ambulatory Visit | Attending: Nurse Practitioner | Admitting: Nurse Practitioner

## 2020-08-23 ENCOUNTER — Other Ambulatory Visit: Payer: Self-pay

## 2020-08-23 DIAGNOSIS — G8929 Other chronic pain: Secondary | ICD-10-CM | POA: Insufficient documentation

## 2020-08-23 DIAGNOSIS — M545 Low back pain, unspecified: Secondary | ICD-10-CM | POA: Diagnosis not present

## 2020-08-23 DIAGNOSIS — M5441 Lumbago with sciatica, right side: Secondary | ICD-10-CM | POA: Insufficient documentation

## 2020-08-23 DIAGNOSIS — M5442 Lumbago with sciatica, left side: Secondary | ICD-10-CM | POA: Diagnosis not present

## 2020-08-23 DIAGNOSIS — M5430 Sciatica, unspecified side: Secondary | ICD-10-CM | POA: Insufficient documentation

## 2020-08-23 DIAGNOSIS — M549 Dorsalgia, unspecified: Secondary | ICD-10-CM | POA: Diagnosis not present

## 2020-08-29 DIAGNOSIS — M5416 Radiculopathy, lumbar region: Secondary | ICD-10-CM | POA: Diagnosis not present

## 2020-08-29 DIAGNOSIS — M5126 Other intervertebral disc displacement, lumbar region: Secondary | ICD-10-CM | POA: Diagnosis not present

## 2020-09-12 DIAGNOSIS — M5416 Radiculopathy, lumbar region: Secondary | ICD-10-CM | POA: Diagnosis not present

## 2020-09-12 DIAGNOSIS — M5126 Other intervertebral disc displacement, lumbar region: Secondary | ICD-10-CM | POA: Diagnosis not present

## 2020-09-19 ENCOUNTER — Other Ambulatory Visit: Payer: Self-pay | Admitting: Obstetrics and Gynecology

## 2020-09-19 DIAGNOSIS — Z1231 Encounter for screening mammogram for malignant neoplasm of breast: Secondary | ICD-10-CM

## 2020-10-03 DIAGNOSIS — M5416 Radiculopathy, lumbar region: Secondary | ICD-10-CM | POA: Diagnosis not present

## 2020-10-03 DIAGNOSIS — M5126 Other intervertebral disc displacement, lumbar region: Secondary | ICD-10-CM | POA: Diagnosis not present

## 2020-10-08 ENCOUNTER — Other Ambulatory Visit: Payer: Self-pay

## 2020-10-09 ENCOUNTER — Encounter: Payer: Self-pay | Admitting: Gastroenterology

## 2020-10-09 ENCOUNTER — Ambulatory Visit (INDEPENDENT_AMBULATORY_CARE_PROVIDER_SITE_OTHER): Payer: 59 | Admitting: Gastroenterology

## 2020-10-09 ENCOUNTER — Other Ambulatory Visit: Payer: Self-pay

## 2020-10-09 VITALS — BP 122/80 | HR 79 | Temp 98.5°F | Wt 169.2 lb

## 2020-10-09 DIAGNOSIS — K5100 Ulcerative (chronic) pancolitis without complications: Secondary | ICD-10-CM

## 2020-10-09 MED ORDER — CLENPIQ 10-3.5-12 MG-GM -GM/160ML PO SOLN: 1.0000 | Freq: Once | ORAL | 0 refills | Status: AC

## 2020-10-09 NOTE — Progress Notes (Signed)
Cephas Darby, MD [address]  [place]  [place], [place] [address]  Main: [phone]  Fax: [phone]    Gastroenterology Consultation  Referring Provider:     Jerrol Banana.,* Primary Care Physician:  Jerrol Banana., MD Primary Gastroenterologist:  Dr. Cephas Darby Reason for Consultation:     Ulcerative pancolitis        HPI:   Angela Thomas is a 59 y.o. y/o female referred by Dr. Rosanna Randy, Retia Passe., MD  for consultation & management of Ulcerative colitis.  Initial Hospital encounter 06/14/2017 She has a long standing history of IBS (diagnosed with negative colonoscopy 30 year ago) as well as a lifetime history of constipation requiring daily senna for the last 2-3 years. She had a normal colonoscopy at age 88 with Dr. Vira Agar.  She reports a 3 months history of diarrhea. Symptoms were severe for several days at the end of July following by a short respite. Recurrent approximately 7 days ago with the return of diarrhea. Yesterday it became frankly bloody and she presented to the emergency room. There is associated, nonradiating LLQ abdominal cramping.   There is associated bloating. No other identified exacerbating or relieving features. No extra intestinal manifestations of IBD. Appetite and energy were normal prior to the onset of symptoms.  A CT scan in the ED showed colon thickening involving the cecum, ascending colon and transverse colon. A CMP and CBC is normal.  Her ESR and CRP were normal. GI pathogen panel and C. difficile were negative.  Interval history  I performed a colonoscopy on 06/15/2017, mucosa was entirely normal in the colon and terminal ileum. Random colon biopsies were performed. Histology was consistent with mild chronic active colitis. Given her history of diarrhea, I started her on lialda 4.8 g daily. She is currently having 2 formed bowel movements daily. She denies any abdominal pain she is happy that her symptoms  are under control.  Follow-up visit 12/14/2017 Patient is doing very well on mesalamine 4.8 g daily. She is currently having one to 2 formed bowel movements daily without any other GI symptoms. She does report burning per rectum upon wiping. Her recent labs showed mildly elevated potassium and sodium. She thought these are secondary to hemolysis.  Follow-up televisit 09/20/2019 Angela Thomas has history of ulcerative pancolitis, mild in severity who is currently maintained on Lialda 2.4 g daily, in clinical remission.  She decrease Lialda from 4.8 to 2.4 g daily since June 2019 and patient is doing very well from GI standpoint.  She denies any GI symptoms today including abdominal pain, diarrhea, rectal bleeding, weight loss.  She will be retiring in December, and her insurance will change probably to [facility].  Follow-up visit 10/09/2020 Patient reports doing very well from this standpoint.  She is currently on mesalamine 1.2 g daily.  She wanted to know if she can use probiotic instead of mesalamine.  Denies any GI symptoms.  Reports having 1 bowel movement daily, denies abdominal cramps, or rectal bleeding.  There is no family history of inflammatory bowel disease or autoimmune disease. There is no family history of colon cancer or polyps. 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 She uses [employer] for DJD and sciatica. She also uses [employer] Essential vitamins.    GI Procedures: Colonoscopy 06/15/2017 Preparation of the colon was fair. Normal colonic mucosa. Normal terminal ileum. Random biopsies performed DIAGNOSIS:  A. RIGHT COLON; COLD BIOPSY:  - MILD CHRONIC ACTIVE COLITIS, SEE COMMENT.   B. LEFT COLON; COLD BIOPSY:  - MILD CHRONIC ACTIVE COLITIS, SEE COMMENT. Past Medical History:  Diagnosis Date  . Cancer [facility])    Endometrial  . Colitis   . Colitis   . DDD (degenerative disc disease), lumbar     Past Surgical History:    Procedure Laterality Date  . ABDOMINAL HYSTERECTOMY     total  . BREAST CYST ASPIRATION Bilateral    neg  . BUNIONECTOMY    . BUNIONECTOMY     With hammer toe repair  . COLONOSCOPY N/A 06/15/2017   Procedure: COLONOSCOPY;  Surgeon: Lin Landsman, MD;  Location: [facility] ENDOSCOPY;  Service: Endoscopy;  Laterality: N/A;  . CYST REMOVAL NECK    . HAMMER TOE SURGERY    . TONSILLECTOMY AND ADENOIDECTOMY       Current Outpatient Medications:  .  ALLERGY RELIEF 180 MG tablet, TAKE ONE TABLET EVERY DAY, Disp: 90 tablet, Rfl: 4 .  cycloSPORINE (RESTASIS) 0.05 % ophthalmic emulsion, Place 1 drop into both eyes every 12 (twelve) hours., Disp: , Rfl:  .  diltiazem (CARDIZEM CD) 180 MG 24 hr capsule, Take 1 capsule (180 mg total) by mouth daily. Pt to let us know if she picks up and starts it., Disp: 90 capsule, Rfl: 3 .  diltiazem (CARDIZEM) 30 MG tablet, Take 1 tablet (30 mg total) by mouth daily as needed (palpitations)., Disp: 30 tablet, Rfl: 3 .  mesalamine (LIALDA) 1.2 g EC tablet, Take 1 tablet (1.2 g total) by mouth 2 (two) times daily., Disp: 180 tablet, Rfl: 0 .  NON FORMULARY, Over-The-Counter Beet Supplement 2 capsules daily, Disp: , Rfl:  .  Omega-3 Fatty Acids (FISH OIL) 1000 MG CAPS, Take 1 capsule by mouth in the morning and at bedtime., Disp: , Rfl:  .  traZODone (DESYREL) 100 MG tablet, Take 1 tablet (100 mg total) by mouth at bedtime. Take 1 1/2 tablets at bedtime, Disp: 45 tablet, Rfl: 4 .  UNABLE TO FIND, Do terra vitality pack, Disp: , Rfl:  .  valACYclovir (VALTREX) 500 MG tablet, TAKE ONE TABLET BY MOUTH TWICE DAILY AS NEEDED, Disp: 60 tablet, Rfl: 12 .  ALPRAZolam (XANAX) 0.25 MG tablet, TAKE ONE TABLET EVERY EIGHT HOURS AS NEEDED FOR ANXIETY (Patient not taking: Reported on 07/03/2020), Disp: 30 tablet, Rfl: 5 .  Diclofenac-miSOPROStol 75-0.2 MG TBEC, TAKE 1 TABLET BY MOUTH TWICE DAILY (Patient not taking: Reported on 10/09/2020), Disp: 60 tablet, Rfl: 12 .  fluticasone  (VERAMYST) 27.5 MCG/SPRAY nasal spray, Place 2 sprays into the nose daily. (Patient not taking: Reported on 10/09/2020), Disp: 10 g, Rfl: 12 .  omeprazole (PRILOSEC) 20 MG capsule, Take 1 capsule (20 mg total) by mouth every morning. (Patient not taking: Reported on 10/09/2020), Disp: 30 capsule, Rfl: 5 .  [START ON 10/15/2020] Prednisol Ace-Moxiflox-Bromfen 1-0.5-0.075 % SUSP, Apply to eye. (Patient not taking: Reported on 10/09/2020), Disp: , Rfl:  .  Sod Picosulfate-Mag Ox-Cit Acd (CLENPIQ) 10-3.5-12 MG-GM -GM/160ML SOLN, Take 1 each by mouth once for 1 dose., Disp: 320 mL, Rfl: 0   Family History  Problem Relation Age of Onset  . COPD Mother   . COPD Father   . Diabetes Father   . Pulmonary fibrosis Father   . Bladder Cancer Father   . Breast cancer Maternal Aunt   . Dementia Maternal Aunt   . Transient ischemic attack Maternal Uncle   . Heart  disease Maternal Uncle   . Cancer Maternal Uncle        laryngeal  . Heart disease Maternal Grandfather   . Diabetes Paternal Grandfather   . Prostate cancer Paternal Grandfather   . Other Paternal Grandmother        cerebral hemorrhage  . Ovarian cancer Neg Hx      Social History   Tobacco Use  . Smoking status: Never Smoker  . Smokeless tobacco: Never Used  Vaping Use  . Vaping Use: Never used  Substance Use Topics  . Alcohol use: Yes    Alcohol/week: 1.0 standard drink    Types: 1 Glasses of wine per week    Comment: occas  . Drug use: No    Allergies as of 10/09/2020 - Review Complete 10/09/2020  Allergen Reaction Noted  . Beef-derived products Anaphylaxis 04/11/2015  . Other Anaphylaxis 04/12/2015  . Pork-derived products Anaphylaxis 04/11/2015    Review of Systems:    All systems reviewed and negative except where noted in HPI.   Physical Exam:  BP 122/80 (BP Location: Left Arm, Patient Position: Sitting, Cuff Size: Normal)   Pulse 79   Temp 98.5 F (36.9 C) (Oral)   Wt 169 lb 4 oz (76.8 kg)   BMI 31.98 kg/m    No LMP recorded. Patient has had a hysterectomy.  General:   Alert,  Well-developed, well-nourished, pleasant and cooperative in NAD Head:  Normocephalic and atraumatic. Eyes:  Sclera clear, no icterus.   Conjunctiva pink. Ears:  Normal auditory acuity. Nose:  No deformity, discharge, or lesions. Mouth:  No deformity or lesions,oropharynx pink & moist. Neck:  Supple; no masses or thyromegaly. Lungs:  Respirations even and unlabored.  Clear throughout to auscultation.   No wheezes, crackles, or rhonchi. No acute distress. Heart:  Regular rate and rhythm; no murmurs, clicks, rubs, or gallops. Abdomen:  Normal bowel sounds.  No bruits.  Soft, non-tender and non-distended without masses, hepatosplenomegaly or hernias noted.  No guarding or rebound tenderness.   Rectal: Nor performed Msk:  Symmetrical without gross deformities. Good, equal movement & strength bilaterally. Pulses:  Normal pulses noted. Extremities:  No clubbing or edema.  No cyanosis. Neurologic:  Alert and oriented x3;  grossly normal neurologically. Skin:  Intact without significant lesions or rashes. No jaundice. Psych:  Alert and cooperative. Normal mood and affect.  Imaging Studies: Reviewed  Assessment and Plan:   Angela Thomas is a 60 y.o. Caucasian female with Mild ulcerative pancolitis in clinical remission on mesalamine 4.8 g daily since 07/2017.  She is currently maintained on 1.2 g daily, fecal calprotectin levels were normal in 2020.   Recommend colonoscopy with biopsies to assess mucosal healing on mesalamine.  If persistent, increase the dose.  I also reiterated patient about lack of evidence regarding probiotics in controlling inflammation in ulcerative colitis  Follow up in 6 months   Cephas Darby, MD

## 2020-10-11 ENCOUNTER — Telehealth: Payer: Self-pay

## 2020-10-11 NOTE — Telephone Encounter (Signed)
Returned patients call. LVM to confirm cancelling procedure. Instructed to call office back.

## 2020-10-12 ENCOUNTER — Telehealth: Payer: Self-pay

## 2020-10-12 NOTE — Telephone Encounter (Signed)
Patient confirmed cancelling procedure at this time via phone message. Pt states she will call back the first of the year to reschedule.

## 2020-10-16 DIAGNOSIS — H2512 Age-related nuclear cataract, left eye: Secondary | ICD-10-CM | POA: Diagnosis not present

## 2020-10-16 DIAGNOSIS — H25812 Combined forms of age-related cataract, left eye: Secondary | ICD-10-CM | POA: Diagnosis not present

## 2020-10-16 DIAGNOSIS — H52222 Regular astigmatism, left eye: Secondary | ICD-10-CM | POA: Diagnosis not present

## 2020-10-16 DIAGNOSIS — H2513 Age-related nuclear cataract, bilateral: Secondary | ICD-10-CM | POA: Diagnosis not present

## 2020-10-16 DIAGNOSIS — K5190 Ulcerative colitis, unspecified, without complications: Secondary | ICD-10-CM | POA: Diagnosis not present

## 2020-10-16 DIAGNOSIS — I10 Essential (primary) hypertension: Secondary | ICD-10-CM | POA: Diagnosis not present

## 2020-10-18 NOTE — Progress Notes (Signed)
Complete physical exam   Patient: Angela Thomas   DOB: Mar 20, 1961   59 y.o. Female  MRN: [id number] Visit Date: 10/22/2020  Today's healthcare provider: [name], MD   Chief Complaint  Patient presents with  . Annual Exam   Subjective    Angela Thomas is a 59 y.o. female who presents today for a complete physical exam.  She reports consuming a general diet. Home exercise routine includes walking daily. She generally feels fairly well. She reports sleeping fairly well. She does not have additional problems to discuss today.  Patient is enjoying retirement.  She is using Cardizem only for palpitations.  She would like to get off the trazodone as she is sleeping better now. HPI    Past Medical History:  Diagnosis Date  . Cancer [facility])    Endometrial  . Colitis   . Colitis   . DDD (degenerative disc disease), lumbar    Past Surgical History:  Procedure Laterality Date  . ABDOMINAL HYSTERECTOMY     total  . BREAST CYST ASPIRATION Bilateral    neg  . BUNIONECTOMY    . BUNIONECTOMY     With hammer toe repair  . COLONOSCOPY N/A 06/15/2017   Procedure: COLONOSCOPY;  Surgeon: Lin Landsman, MD;  Location: [facility] ENDOSCOPY;  Service: Endoscopy;  Laterality: N/A;  . CYST REMOVAL NECK    . HAMMER TOE SURGERY    . TONSILLECTOMY AND ADENOIDECTOMY     Social History   Socioeconomic History  . Marital status: Divorced    Spouse name: Not on file  . Number of children: Not on file  . Years of education: Not on file  . Highest education level: Not on file  Occupational History  . Not on file  Tobacco Use  . Smoking status: Never Smoker  . Smokeless tobacco: Never Used  Vaping Use  . Vaping Use: Never used  Substance and Sexual Activity  . Alcohol use: Yes    Alcohol/week: 1.0 standard drink    Types: 1 Glasses of wine per week    Comment: occas  . Drug use: No  . Sexual activity: Not Currently  Other Topics Concern  . Not on file  Social History  Narrative  . Not on file   Social Determinants of Health   Financial Resource Strain: Not on file  Food Insecurity: Not on file  Transportation Needs: Not on file  Physical Activity: Not on file  Stress: Not on file  Social Connections: Not on file  Intimate Partner Violence: Not on file   Family Status  Relation Name Status  . Mother  Deceased at age 56  . Father  Deceased at age 66  . Sister  Alive  . Sister  Alive  . Sister  Alive  . Daughter  Alive  . Mat Aunt  Deceased  . Mat Uncle  (Not Specified)  . MGF  (Not Specified)  . PGF  (Not Specified)  . PGM  (Not Specified)  . Neg Hx  (Not Specified)   Family History  Problem Relation Age of Onset  . COPD Mother   . COPD Father   . Diabetes Father   . Pulmonary fibrosis Father   . Bladder Cancer Father   . Breast cancer Maternal Aunt   . Dementia Maternal Aunt   . Transient ischemic attack Maternal Uncle   . Heart disease Maternal Uncle   . Cancer Maternal Uncle  laryngeal  . Heart disease Maternal Grandfather   . Diabetes Paternal Grandfather   . Prostate cancer Paternal Grandfather   . Other Paternal Grandmother        cerebral hemorrhage  . Ovarian cancer Neg Hx    Allergies  Allergen Reactions  . Beef-Derived Products Anaphylaxis  . Other Anaphylaxis    Reaction to red meat and pork products  . Pork-Derived Products Anaphylaxis    Patient Care Team: Jerrol Banana., MD as PCP - General (Family Medicine) Fay Records, MD as PCP - Cardiology (Cardiology)   Medications: Outpatient Medications Prior to Visit  Medication Sig  . ALLERGY RELIEF 180 MG tablet TAKE ONE TABLET EVERY DAY  . cycloSPORINE (RESTASIS) 0.05 % ophthalmic emulsion Place 1 drop into both eyes every 12 (twelve) hours.  . Diclofenac-miSOPROStol 75-0.2 MG TBEC TAKE 1 TABLET BY MOUTH TWICE DAILY  . diltiazem (CARDIZEM CD) 180 MG 24 hr capsule Take 1 capsule (180 mg total) by mouth daily. Pt to let us know if she picks up  and starts it.  . mesalamine (LIALDA) 1.2 g EC tablet Take 1 tablet (1.2 g total) by mouth 2 (two) times daily.  . NON FORMULARY Over-The-Counter Beet Supplement 2 capsules daily  . Omega-3 Fatty Acids (FISH OIL) 1000 MG CAPS Take 1 capsule by mouth in the morning and at bedtime.  . Prednisol Ace-Moxiflox-Bromfen 1-0.5-0.075 % SUSP Apply to eye.  . traZODone (DESYREL) 100 MG tablet Take 1 tablet (100 mg total) by mouth at bedtime. Take 1 1/2 tablets at bedtime  . UNABLE TO FIND Do terra vitality pack  . valACYclovir (VALTREX) 500 MG tablet TAKE ONE TABLET BY MOUTH TWICE DAILY AS NEEDED  . ALPRAZolam (XANAX) 0.25 MG tablet TAKE ONE TABLET EVERY EIGHT HOURS AS NEEDED FOR ANXIETY (Patient not taking: No sig reported)  . [DISCONTINUED] diltiazem (CARDIZEM) 30 MG tablet Take 1 tablet (30 mg total) by mouth daily as needed (palpitations).  . [DISCONTINUED] fluticasone (VERAMYST) 27.5 MCG/SPRAY nasal spray Place 2 sprays into the nose daily. (Patient not taking: Reported on 10/09/2020)  . [DISCONTINUED] omeprazole (PRILOSEC) 20 MG capsule Take 1 capsule (20 mg total) by mouth every morning. (Patient not taking: Reported on 10/09/2020)   No facility-administered medications prior to visit.    Review of Systems  Constitutional: Negative for chills, fatigue and fever.  HENT: Negative for congestion, ear pain, rhinorrhea, sneezing and sore throat.   Eyes: Negative.  Negative for pain and redness.  Respiratory: Negative for cough, shortness of breath and wheezing.   Cardiovascular: Negative for chest pain and leg swelling.  Gastrointestinal: Negative for abdominal pain, blood in stool, constipation, diarrhea and nausea.  Endocrine: Negative for polydipsia and polyphagia.  Genitourinary: Negative.  Negative for dysuria, flank pain, hematuria, pelvic pain, vaginal bleeding and vaginal discharge.  Musculoskeletal: Negative for arthralgias, back pain, gait problem and joint swelling.  Skin: Negative for  rash.  Allergic/Immunologic: Positive for food allergies.  Neurological: Negative.  Negative for dizziness, tremors, seizures, weakness, light-headedness, numbness and headaches.  Hematological: Negative for adenopathy.  Psychiatric/Behavioral: Negative.  Negative for behavioral problems, confusion and dysphoric mood. The patient is not nervous/anxious and is not hyperactive.   All other systems reviewed and are negative.      Objective    BP 113/72 (BP Location: Right Arm, Patient Position: Sitting, Cuff Size: Normal)   Pulse 69   Temp 98.4 F (36.9 C) (Temporal)   Resp 16   Ht 5' 1"  (1.549 m)  Wt 172 lb 3.2 oz (78.1 kg)   BMI 32.54 kg/m  BP Readings from Last 3 Encounters:  10/22/20 113/72  10/09/20 122/80  07/03/20 110/70   Wt Readings from Last 3 Encounters:  10/22/20 172 lb 3.2 oz (78.1 kg)  10/09/20 169 lb 4 oz (76.8 kg)  07/03/20 175 lb (79.4 kg)      Physical Exam Vitals reviewed.  Constitutional:      Appearance: She is well-developed.  HENT:     Head: Normocephalic and atraumatic.     Right Ear: External ear normal.     Left Ear: External ear normal.     Nose: Nose normal.  Eyes:     Conjunctiva/sclera: Conjunctivae normal.     Pupils: Pupils are equal, round, and reactive to light.  Cardiovascular:     Rate and Rhythm: Normal rate and regular rhythm.     Heart sounds: Normal heart sounds.  Pulmonary:     Effort: Pulmonary effort is normal.     Breath sounds: Normal breath sounds.  Abdominal:     General: Bowel sounds are normal.     Palpations: Abdomen is soft.  Musculoskeletal:        General: Normal range of motion.     Cervical back: Normal range of motion and neck supple.  Skin:    General: Skin is warm and dry.  Neurological:     Mental Status: She is alert and oriented to person, place, and time.  Psychiatric:        Behavior: Behavior normal.        Thought Content: Thought content normal.        Judgment: Judgment normal.        Last depression screening scores PHQ 2/9 Scores 10/22/2020 10/19/2019 08/18/2018  PHQ - 2 Score 0 0 0  PHQ- 9 Score 0 0 -   Last fall risk screening Fall Risk  08/18/2018  Falls in the past year? Yes  Number falls in past yr: 1  Injury with Fall? No   Last Audit-C alcohol use screening Alcohol Use Disorder Test (AUDIT) 10/19/2019  1. How often do you have a drink containing alcohol? 2  2. How many drinks containing alcohol do you have on a typical day when you are drinking? 0  3. How often do you have six or more drinks on one occasion? 0  AUDIT-C Score 2   A score of 3 or more in women, and 4 or more in men indicates increased risk for alcohol abuse, EXCEPT if all of the points are from question 1   No results found for any visits on 10/22/20.  Assessment & Plan    Routine Health Maintenance and Physical Exam  Exercise Activities and Dietary recommendations Goals   None     Immunization History  Administered Date(s) Administered  . Influenza, High Dose Seasonal PF 08/22/2020  . Influenza,inj,Quad PF,6+ Mos 08/17/2019  . Influenza-Unspecified 09/03/2015, 08/03/2017, 07/22/2018  . PFIZER SARS-COV-2 Vaccination 02/01/2020, 02/29/2020  . Tdap 12/09/2012    Health Maintenance  Topic Date Due  . COVID-19 Vaccine (3 - Pfizer risk 4-dose series) 03/28/2020  . PAP SMEAR-Modifier  07/21/2021  . MAMMOGRAM  08/09/2021  . TETANUS/TDAP  12/09/2022  . COLONOSCOPY  06/16/2027  . INFLUENZA VACCINE  Completed  . Hepatitis C Screening  Completed  . HIV Screening  Completed    Discussed health benefits of physical activity, and encouraged her to engage in regular exercise appropriate for her age and condition.  1. Annual physical exam Woman exam per GYN. Colonoscopy done in January 2021. Stop trazodone.  2. Allergic rhinitis, unspecified seasonality, unspecified trigger   3. Class 1 obesity due to excess calories without serious comorbidity with body mass index (BMI)  of 32.0 to 32.9 in adult Patient doing well with diet and exercise.  4. History of uterine cancer Followed by gynecology   Return in about 1 year (around 10/22/2021) for CPE.        Wil Slape Cranford Mon, MD  [facility] [phone] (phone) [phone] (fax)  [place]

## 2020-10-22 ENCOUNTER — Other Ambulatory Visit: Payer: Self-pay

## 2020-10-22 ENCOUNTER — Ambulatory Visit: Payer: 59 | Admitting: Family Medicine

## 2020-10-22 ENCOUNTER — Encounter: Payer: Self-pay | Admitting: Family Medicine

## 2020-10-22 VITALS — BP 113/72 | HR 69 | Temp 98.4°F | Resp 16 | Ht 61.0 in | Wt 172.2 lb

## 2020-10-22 DIAGNOSIS — Z6832 Body mass index (BMI) 32.0-32.9, adult: Secondary | ICD-10-CM

## 2020-10-22 DIAGNOSIS — E6609 Other obesity due to excess calories: Secondary | ICD-10-CM | POA: Diagnosis not present

## 2020-10-22 DIAGNOSIS — Z0000 Encounter for general adult medical examination without abnormal findings: Secondary | ICD-10-CM

## 2020-10-22 DIAGNOSIS — Z8542 Personal history of malignant neoplasm of other parts of uterus: Secondary | ICD-10-CM | POA: Diagnosis not present

## 2020-10-22 DIAGNOSIS — J309 Allergic rhinitis, unspecified: Secondary | ICD-10-CM | POA: Diagnosis not present

## 2020-10-24 ENCOUNTER — Encounter: Payer: 59 | Admitting: Obstetrics and Gynecology

## 2020-10-25 ENCOUNTER — Ambulatory Visit: Admit: 2020-10-25 | Payer: 59 | Admitting: Gastroenterology

## 2020-10-25 SURGERY — COLONOSCOPY WITH PROPOFOL
Anesthesia: General

## 2020-10-26 ENCOUNTER — Other Ambulatory Visit: Payer: Self-pay | Admitting: Gastroenterology

## 2020-10-26 DIAGNOSIS — K5100 Ulcerative (chronic) pancolitis without complications: Secondary | ICD-10-CM

## 2020-10-29 DIAGNOSIS — M5126 Other intervertebral disc displacement, lumbar region: Secondary | ICD-10-CM | POA: Diagnosis not present

## 2020-10-29 DIAGNOSIS — M5416 Radiculopathy, lumbar region: Secondary | ICD-10-CM | POA: Diagnosis not present

## 2020-12-03 ENCOUNTER — Encounter: Payer: Self-pay | Admitting: Internal Medicine

## 2020-12-03 ENCOUNTER — Ambulatory Visit (INDEPENDENT_AMBULATORY_CARE_PROVIDER_SITE_OTHER): Payer: 59 | Admitting: Internal Medicine

## 2020-12-03 ENCOUNTER — Other Ambulatory Visit: Payer: Self-pay

## 2020-12-03 VITALS — BP 134/82 | HR 78 | Ht 61.0 in | Wt 173.2 lb

## 2020-12-03 DIAGNOSIS — R002 Palpitations: Secondary | ICD-10-CM

## 2020-12-03 NOTE — Patient Instructions (Signed)
Medication Instructions:  none *If you need a refill on your cardiac medications before your next appointment, please call your pharmacy*   Lab Work: none If you have labs (blood work) drawn today and your tests are completely normal, you will receive your results only by: Marland Kitchen MyChart Message (if you have MyChart) OR . A paper copy in the mail If you have any lab test that is abnormal or we need to change your treatment, we will call you to review the results.   Testing/Procedures: none   Follow-Up: No follow up at this time.  Insurance is now out of network.

## 2020-12-03 NOTE — Progress Notes (Signed)
**Note De-Identified Angela Obfuscation** Cardiology Office Note   Date:  12/03/2020   ID:  DAVETTA OLLIFF, DOB 1961/03/10, MRN [id number]  PCP:  Jerrol Banana., MD  Cardiologist:   Dorris Carnes, MD   Pt presents for f/u of tachycardia     History of Present Illness: Angela Thomas is a 60 y.o. female with a history of HTN, tachycardia, alpha gal reaction, ulcerative colitis, HL, and chest pain   Seen  In ED in early  May 2021 and admitted with CP   Described as "twinges"   Nonexertional  Echo done  Showed LVEF 65 to 34%  Diastolic dysfunction  Sent home for follow up in cardiology     Patient says she had episodes of tachycardia in past  2012  Seen at [facility] by Dr Nehemiah Massed   Stress test done  Reports that it was negative.   Given Xanax in past by Dr Rosanna Randy but never used.    In March had a spell of heart racing   Took 1/2 Xanax   Resolved in a couple hours.   Again happened in APril a couple of times          On  Mar 08, 2020   Felt tired  WeNt to church  Eyes felt fuzzy   She complained of a mild headache   Felt worse as day went on   BP 170/90    Heart was racing   100  (Normal resting is 60 to 68)  Went to ED    Sensatons of a tickle in chest,   No pain   No SOB     After D/C seen by Dr Rosanna Randy   Put on Toprol XL 25  Th pt said she felt more fatigued since staring with ringing in ears.  CUt in 1/2   When I saw her I recomm starting diltiazem   She started initially but then felt bad, pulled back   Has restarted since I also recomm she had a CT calcium score to further define cardiac risk   She had this done in June 2021  Score was 76    I recomm statin Rx   SHe has declined   Wants to work with diet   Following the [employer]   She says now she has rare palpitation.  Breathing is good   She denies CP  Active   Walks   No SOB with acitvity  Plan for back surgery in [place] in March 2022  Current Meds  Medication Sig  . ALLERGY RELIEF 180 MG tablet TAKE ONE TABLET EVERY DAY  . cycloSPORINE (RESTASIS)  0.05 % ophthalmic emulsion Place 1 drop into both eyes every 12 (twelve) hours.  Marland Kitchen diltiazem (CARDIZEM CD) 180 MG 24 hr capsule Take 1 capsule (180 mg total) by mouth daily. Pt to let us know if she picks up and starts it.  . mesalamine (LIALDA) 1.2 g EC tablet TAKE ONE TABLET BY MOUTH TWICE DAILY  . NON FORMULARY Over-The-Counter Beet Supplement 2 capsules daily  . traZODone (DESYREL) 100 MG tablet Take 1 tablet (100 mg total) by mouth at bedtime. Take 1 1/2 tablets at bedtime  . UNABLE TO FIND Do terra vitality pack  . valACYclovir (VALTREX) 500 MG tablet TAKE ONE TABLET BY MOUTH TWICE DAILY AS NEEDED     Allergies:   Beef-derived products, Other, and Pork-derived products   Past Medical History:  Diagnosis Date  . Cancer [facility])    Endometrial  .  Colitis   . Colitis   . DDD (degenerative disc disease), lumbar     Past Surgical History:  Procedure Laterality Date  . ABDOMINAL HYSTERECTOMY     total  . BREAST CYST ASPIRATION Bilateral    neg  . BUNIONECTOMY    . BUNIONECTOMY     With hammer toe repair  . COLONOSCOPY N/A 06/15/2017   Procedure: COLONOSCOPY;  Surgeon: Lin Landsman, MD;  Location: [facility] ENDOSCOPY;  Service: Endoscopy;  Laterality: N/A;  . CYST REMOVAL NECK    . HAMMER TOE SURGERY    . TONSILLECTOMY AND ADENOIDECTOMY       Social History:  The patient  reports that she has never smoked. She has never used smokeless tobacco. She reports current alcohol use of about 1.0 standard drink of alcohol per week. She reports that she does not use drugs.   Family History:  The patient's family history includes Bladder Cancer in her father; Breast cancer in her maternal aunt; COPD in her father and mother; Cancer in her maternal uncle; Dementia in her maternal aunt; Diabetes in her father and paternal grandfather; Heart disease in her maternal grandfather and maternal uncle; Other in her paternal grandmother; Prostate cancer in her paternal grandfather; Pulmonary fibrosis  in her father; Transient ischemic attack in her maternal uncle.    ROS:  Please see the history of present illness. All other systems are reviewed and  Negative to the above problem except as noted.    PHYSICAL EXAM: VS:  BP 134/82   Pulse 78   Ht 5' 1"  (1.549 m)   Wt 173 lb 3.2 oz (78.6 kg)   SpO2 99%   BMI 32.73 kg/m   GEN: Obese 60 yo  in no acute distress  HEENT: normal  Neck: no JVD, carotid bruits Cardiac: RRR; no murmurs.  No LE  edema  Respiratory:  clear to auscultation bilaterally,  GI: soft, nontender, nondistended, + BS  No hepatomegaly  MS: no deformity Moving all extremities   Skin: warm and dry, no rash Neuro:  Strength and sensation are intact Psych: euthymic mood, full affect   EKG:  EKG is not ordered today.    Lipid Panel    Component Value Date/Time   CHOL 214 (H) 07/04/2020 1003   TRIG 96 07/04/2020 1003   HDL 57 07/04/2020 1003   CHOLHDL 3.8 07/04/2020 1003   CHOLHDL 3.9 03/05/2020 0723   VLDL 14 03/05/2020 0723   LDLCALC 140 (H) 07/04/2020 1003      Wt Readings from Last 3 Encounters:  12/03/20 173 lb 3.2 oz (78.6 kg)  10/22/20 172 lb 3.2 oz (78.1 kg)  10/09/20 169 lb 4 oz (76.8 kg)      ASSESSMENT AND PLAN:  1. Palpitations   Pt currently doing much better   Denies significant spells of palptaitons   Only occasional   Limited  Would keep on same regimen   2  CAD  Calcium score in June 2021:  78  Pt is without symptoms   Has been working on diet (Med diet) Pt should have Lipomed    She no longer has insurance   Would get when she reestablishes with a cardiologist  3  Lipdis   Labs in Sept 2021:  LDL 140  HDL 57   Would get lipomed now that on Med diet  4  Preop cardiac risk stratificatoin   From a cardiac standpoint I think pt is low risk for a major cardiac event  with planned back surgery and OK to proceed  The pt is followe by [employer] [facility])  2.  Cardiac risk stratification.   Pt's cholesterol panel  is not optimal   Discussed diet.   Would recomm a calcium score CT to better risk stratify pt, particularly with above spells   Plan for f/u in 6 wks  Bring log of BP and HR as well as BP cuff to appt  Current medicines are reviewed at length with the patient today.  The patient does not have concerns regarding medicines.  Signed, Dorris Carnes, MD  12/03/2020 3:58 PM    [address], [place], [place]  [address] Phone: [phone]; Fax: [phone]

## 2021-01-15 ENCOUNTER — Encounter: Payer: 59 | Admitting: Obstetrics and Gynecology

## 2021-01-25 ENCOUNTER — Other Ambulatory Visit: Payer: Self-pay | Admitting: *Deleted

## 2021-01-25 NOTE — Patient Outreach (Signed)
[facility]) Care Management  01/25/2021  SORAIDA VICKERS [birth year] [id number]   Referral Received 3/22 Initial Outreach 3/25  RN attempted outreach call today however unsuccessful. RN able to leave a HIPAA approved voice message requesting a call back.  Will further engage at that time and send outreach letter. Will rescheduled another outreach call over the next week.  Raina Mina, RN Care Management Coordinator [place] Office [phone]

## 2021-01-31 ENCOUNTER — Other Ambulatory Visit: Payer: Self-pay | Admitting: *Deleted

## 2021-01-31 NOTE — Patient Outreach (Signed)
[facility]) Care Management  01/31/2021  Angela Thomas Jun 26, 1961 [id number]   Referral received 3/31  Telephone Screen/Ineligible for [facility] services  RN spoke with pt today and introduced the [facility] services and purpose for today's call based upon the recent referral from [place]. Pt states she had since changed insurance carriers and no longer a member with [employer].   Based upon this information pt is not eligible for [facility] services at this time. Will close the case and notify the provider.   Raina Mina, RN Care Management Coordinator [place] Office [phone]

## 2021-02-13 ENCOUNTER — Ambulatory Visit (INDEPENDENT_AMBULATORY_CARE_PROVIDER_SITE_OTHER): Payer: BLUE CROSS/BLUE SHIELD | Admitting: Obstetrics and Gynecology

## 2021-02-13 ENCOUNTER — Other Ambulatory Visit: Payer: Self-pay

## 2021-02-13 ENCOUNTER — Encounter: Payer: Self-pay | Admitting: Obstetrics and Gynecology

## 2021-02-13 VITALS — BP 118/70 | HR 65 | Ht 61.0 in | Wt 170.1 lb

## 2021-02-13 DIAGNOSIS — Z8542 Personal history of malignant neoplasm of other parts of uterus: Secondary | ICD-10-CM

## 2021-02-13 DIAGNOSIS — Z6835 Body mass index (BMI) 35.0-35.9, adult: Secondary | ICD-10-CM

## 2021-02-13 DIAGNOSIS — N952 Postmenopausal atrophic vaginitis: Secondary | ICD-10-CM

## 2021-02-13 DIAGNOSIS — Z1231 Encounter for screening mammogram for malignant neoplasm of breast: Secondary | ICD-10-CM

## 2021-02-13 DIAGNOSIS — Z01419 Encounter for gynecological examination (general) (routine) without abnormal findings: Secondary | ICD-10-CM

## 2021-02-13 NOTE — Progress Notes (Signed)
Annual Exam-Pt stated that she was doing well.

## 2021-02-13 NOTE — Progress Notes (Signed)
ANNUAL PREVENTATIVE CARE GYNECOLOGY  ENCOUNTER NOTE  Subjective:       Angela Thomas is a 60 y.o. [phone] female here for a routine annual gynecologic exam. She has a GYN history significant for endometrial cancer s/p robotic hysterectomy with BSO and lymph node disection in 2013 (endometrioid adenocarcinoma with mucinous differentiation, grade 1).  She patient is not sexually active. The patient is not taking hormone replacement therapy. Patient denies post-menopausal vaginal bleeding. The patient wears seatbelts: yes. The patient participates in regular exercise: yes (walking 6 days per week).    Current complaints: 1.  Reports recently having back surgery 5 weeks ago. Otherwise doing well.     Gynecologic History No LMP recorded. Patient has had a hysterectomy. Contraception: post menopausal status and s/p hysterectomy Last Pap: 07/21/2018. Results were: normal Last mammogram: 08/2019. Results were: normal. Last Colonoscopy: Had virtual colonoscopy 06/15/2017. Reports normal findings Last Dexa Scan: 08/31/2012. Norrmal.    Obstetric History OB History  Gravida Para Term Preterm AB Living  2 2 2     2   SAB IAB Ectopic Multiple Live Births          2    # Outcome Date GA Lbr Len/2nd Weight Sex Delivery Anes PTL Lv  2 Term 1987   8 lb 9.6 oz (3.901 kg) F Vag-Spont   LIV  1 Term 1985   8 lb 2.2 oz (3.692 kg) F Vag-Spont   LIV    Past Medical History:  Diagnosis Date  . Cancer [facility])    Endometrial  . Colitis   . Colitis   . DDD (degenerative disc disease), lumbar     Family History  Problem Relation Age of Onset  . COPD Mother   . COPD Father   . Diabetes Father   . Pulmonary fibrosis Father   . Bladder Cancer Father   . Breast cancer Maternal Aunt   . Dementia Maternal Aunt   . Transient ischemic attack Maternal Uncle   . Heart disease Maternal Uncle   . Cancer Maternal Uncle        laryngeal  . Heart disease Maternal Grandfather   . Diabetes Paternal  Grandfather   . Prostate cancer Paternal Grandfather   . Other Paternal Grandmother        cerebral hemorrhage  . Ovarian cancer Neg Hx     Past Surgical History:  Procedure Laterality Date  . ABDOMINAL HYSTERECTOMY     total  . BREAST CYST ASPIRATION Bilateral    neg  . BUNIONECTOMY    . BUNIONECTOMY     With hammer toe repair  . COLONOSCOPY N/A 06/15/2017   Procedure: COLONOSCOPY;  Surgeon: Lin Landsman, MD;  Location: [facility] ENDOSCOPY;  Service: Endoscopy;  Laterality: N/A;  . CYST REMOVAL NECK    . HAMMER TOE SURGERY    . TONSILLECTOMY AND ADENOIDECTOMY      Social History   Socioeconomic History  . Marital status: Divorced    Spouse name: Not on file  . Number of children: Not on file  . Years of education: Not on file  . Highest education level: Not on file  Occupational History  . Not on file  Tobacco Use  . Smoking status: Never Smoker  . Smokeless tobacco: Never Used  Vaping Use  . Vaping Use: Never used  Substance and Sexual Activity  . Alcohol use: Yes    Alcohol/week: 1.0 standard drink    Types: 1 Glasses of wine per week  Comment: occas  . Drug use: No  . Sexual activity: Not Currently  Other Topics Concern  . Not on file  Social History Narrative  . Not on file   Social Determinants of Health   Financial Resource Strain: Not on file  Food Insecurity: Not on file  Transportation Needs: Not on file  Physical Activity: Not on file  Stress: Not on file  Social Connections: Not on file  Intimate Partner Violence: Not on file    Current Outpatient Medications on File Prior to Visit  Medication Sig Dispense Refill  . cycloSPORINE (RESTASIS) 0.05 % ophthalmic emulsion Place 1 drop into both eyes every 12 (twelve) hours.    Marland Kitchen diltiazem (CARDIZEM CD) 180 MG 24 hr capsule Take 1 capsule (180 mg total) by mouth daily. Pt to let us know if she picks up and starts it. 90 capsule 3  . mesalamine (LIALDA) 1.2 g EC tablet TAKE ONE TABLET BY MOUTH  TWICE DAILY (Patient taking differently: daily with breakfast.) 180 tablet 0  . NON FORMULARY Over-The-Counter Beet Supplement 2 capsules daily    . traZODone (DESYREL) 100 MG tablet Take 1 tablet (100 mg total) by mouth at bedtime. Take 1 1/2 tablets at bedtime 45 tablet 4  . valACYclovir (VALTREX) 500 MG tablet TAKE ONE TABLET BY MOUTH TWICE DAILY AS NEEDED 60 tablet 12   No current facility-administered medications on file prior to visit.    Allergies  Allergen Reactions  . Beef-Derived Products Anaphylaxis  . Other Anaphylaxis    Reaction to red meat and pork products  . Pork-Derived Products Anaphylaxis      Review of Systems ROS Review of Systems - General ROS: negative for - chills, fatigue, fever, hot flashes, night sweats, weight gain or weight loss Psychological ROS: negative for - anxiety, decreased libido, depression, mood swings, physical abuse or sexual abuse Ophthalmic ROS: negative for - blurry vision, eye pain or loss of vision ENT ROS: negative for - headaches, hearing change, visual changes or vocal changes Allergy and Immunology ROS: negative for - hives, itchy/watery eyes or seasonal allergies Hematological and Lymphatic ROS: negative for - bleeding problems, bruising, swollen lymph nodes or weight loss Endocrine ROS: negative for - galactorrhea, hair pattern changes, hot flashes, malaise/lethargy, mood swings, palpitations, polydipsia/polyuria, skin changes, temperature intolerance or unexpected weight changes Breast ROS: negative for - new or changing breast lumps or nipple discharge Respiratory ROS: negative for - cough or shortness of breath Cardiovascular ROS: negative for - chest pain, irregular heartbeat, palpitations or shortness of breath Gastrointestinal ROS: no abdominal pain, change in bowel habits, or black or bloody stools Genito-Urinary ROS: no dysuria, trouble voiding, or hematuria Musculoskeletal ROS: positive for -see HPI.  Neurological ROS:  negative for - bowel and bladder control changes Dermatological ROS: negative for rash and skin lesion changes   Objective:   BP 118/70   Pulse 65   Ht 5' 1"  (1.549 m)   Wt 170 lb 1.6 oz (77.2 kg)   BMI 32.14 kg/m  CONSTITUTIONAL: Well-developed, well-nourished female in no acute distress.  PSYCHIATRIC: Normal mood and affect. Normal behavior. Normal judgment and thought content. [place]: Alert and oriented to person, place, and time. Normal muscle tone coordination. No cranial nerve deficit noted. HENT:  Normocephalic, atraumatic, External right and left ear normal. Oropharynx is clear and moist EYES: Conjunctivae and EOM are normal. Pupils are equal, round, and reactive to light. No scleral icterus.  NECK: Normal range of motion, supple, no masses.  Normal thyroid.  SKIN: Skin is warm and dry. No rash noted. Not diaphoretic. No erythema. No pallor. CARDIOVASCULAR: Normal heart rate noted, regular rhythm, no murmur. RESPIRATORY: Clear to auscultation bilaterally. Effort and breath sounds normal, no problems with respiration noted. BREASTS: Symmetric in size. No masses, skin changes, nipple drainage, or lymphadenopathy. ABDOMEN: Soft, normal bowel sounds, no distention noted.  No tenderness, rebound or guarding.  BLADDER: Normal PELVIC:  Bladder no bladder distension noted  Urethra: normal appearing urethra with no masses, tenderness or lesions  Vulva: normal appearing vulva with no masses, tenderness or lesions  Vagina: mildly atrophic vagina with normal color and discharge, no lesions  Cervix: surgically absent  Uterus: surgically absent, vaginal cuff well healed  Adnexa: surgically absent bilateral  RV: External Exam NormaI, No Rectal Masses and Normal Sphincter tone  MUSCULOSKELETAL: Normal range of motion. No tenderness.  No cyanosis, clubbing, or edema.  2+ distal pulses. LYMPHATIC: No Axillary, Supraclavicular, or Inguinal Adenopathy.   Labs: Lab Results  Component  Value Date   WBC 4.1 07/04/2020   HGB 13.4 07/04/2020   HCT 39.8 07/04/2020   MCV 95 07/04/2020   PLT 289 07/04/2020    Lab Results  Component Value Date   CREATININE 0.62 07/04/2020   BUN 9 07/04/2020   NA 142 07/04/2020   K 4.6 07/04/2020   CL 101 07/04/2020   CO2 26 07/04/2020    Lab Results  Component Value Date   ALT 21 07/04/2020   AST 20 07/04/2020   ALKPHOS 79 07/04/2020   BILITOT 0.3 07/04/2020    Lab Results  Component Value Date   CHOL 214 (H) 07/04/2020   HDL 57 07/04/2020   LDLCALC 140 (H) 07/04/2020   TRIG 96 07/04/2020   CHOLHDL 3.8 07/04/2020    Lab Results  Component Value Date   TSH 2.305 03/05/2020    Lab Results  Component Value Date   HGBA1C 5.8 (H) 03/05/2020      Results for orders placed or performed in visit on 07/03/20  CBC w/Diff/Platelet  Result Value Ref Range   WBC 4.1 3.4 - 10.8 x10E3/uL   RBC 4.19 3.77 - 5.28 x10E6/uL   Hemoglobin 13.4 11.1 - 15.9 g/dL   Hematocrit 39.8 34.0 - 46.6 %   MCV 95 79 - 97 fL   MCH 32.0 26.6 - 33.0 pg   MCHC 33.7 31.5 - 35.7 g/dL   RDW 12.2 11.7 - 15.4 %   Platelets 289 150 - 450 x10E3/uL   Neutrophils 60 Not Estab. %   Lymphs 25 Not Estab. %   Monocytes 12 Not Estab. %   Eos 2 Not Estab. %   Basos 1 Not Estab. %   Neutrophils Absolute 2.5 1.4 - 7.0 x10E3/uL   Lymphocytes Absolute 1.0 0.7 - 3.1 x10E3/uL   Monocytes Absolute 0.5 0.1 - 0.9 x10E3/uL   EOS (ABSOLUTE) 0.1 0.0 - 0.4 x10E3/uL   Basophils Absolute 0.0 0.0 - 0.2 x10E3/uL   Immature Granulocytes 0 Not Estab. %   Immature Grans (Abs) 0.0 0.0 - 0.1 x10E3/uL  Comprehensive Metabolic Panel (CMET)  Result Value Ref Range   Glucose 92 65 - 99 mg/dL   BUN 9 8 - 27 mg/dL   Creatinine, Ser 0.62 0.57 - 1.00 mg/dL   GFR calc non Af Amer 98 >59 mL/min/1.73   GFR calc Af Amer 113 >59 mL/min/1.73   BUN/Creatinine Ratio 15 12 - 28   Sodium 142 134 - 144 mmol/L   Potassium  4.6 3.5 - 5.2 mmol/L   Chloride 101 96 - 106 mmol/L   CO2 26 20  - 29 mmol/L   Calcium 9.9 8.7 - 10.3 mg/dL   Total Protein 6.7 6.0 - 8.5 g/dL   Albumin 4.4 3.8 - 4.8 g/dL   Globulin, Total 2.3 1.5 - 4.5 g/dL   Albumin/Globulin Ratio 1.9 1.2 - 2.2   Bilirubin Total 0.3 0.0 - 1.2 mg/dL   Alkaline Phosphatase 79 48 - 121 IU/L   AST 20 0 - 40 IU/L   ALT 21 0 - 32 IU/L  Lipid panel  Result Value Ref Range   Cholesterol, Total 214 (H) 100 - 199 mg/dL   Triglycerides 96 0 - 149 mg/dL   HDL 57 >39 mg/dL   VLDL Cholesterol Cal 17 5 - 40 mg/dL   LDL Chol Calc (NIH) 140 (H) 0 - 99 mg/dL   Chol/HDL Ratio 3.8 0.0 - 4.4 ratio    Assessment:   1. Encounter for well woman exam with routine gynecological exam   2. Breast cancer screening by mammogram   3. History of endometrial cancer   4. Vaginal atrophy   5. BMI 35.0-35.9,adult     Plan:  Pap: Not needed.S/p hysterectomy.  Mammogram: Ordered Stool Guaiac Testing:  Up to date. Patient had virutual colonsocpy several years ago.  Labs: Up to date. Discussed mild lipid elevations, encouraged continued exercise and low-cholesterol diet. Done by PCP.  Routine preventative health maintenance measures emphasized: Exercise/Diet/Weight control, Tobacco Warnings, Alcohol/Substance use risks, Stress Management and Peer Pressure Issues.  History of endometrial cancer, remains NED. Is down to yearly surveillance.  Up to date on flu vaccine.  COVID vaccination status completed.   Is eligible for booster.  Vaginal atrophy present, however not bothersome to patient.  No treatment necessary. Return to [place], MD  [facility]

## 2021-02-13 NOTE — Patient Instructions (Signed)
Preventive Care 62-62 Years Old, Female Preventive care refers to lifestyle choices and visits with your health care provider that can promote health and wellness. This includes:  A yearly physical exam. This is also called an annual wellness visit.  Regular dental and eye exams.  Immunizations.  Screening for certain conditions.  Healthy lifestyle choices, such as: ? Eating a healthy diet. ? Getting regular exercise. ? Not using drugs or products that contain nicotine and tobacco. ? Limiting alcohol use. What can I expect for my preventive care visit? Physical exam Your health care provider will check your:  Height and weight. These may be used to calculate your BMI (body mass index). BMI is a measurement that tells if you are at a healthy weight.  Heart rate and blood pressure.  Body temperature.  Skin for abnormal spots. Counseling Your health care provider may ask you questions about your:  Past medical problems.  Family's medical history.  Alcohol, tobacco, and drug use.  Emotional well-being.  Home life and relationship well-being.  Sexual activity.  Diet, exercise, and sleep habits.  Work and work Statistician.  Access to firearms.  Method of birth control.  Menstrual cycle.  Pregnancy history. What immunizations do I need? Vaccines are usually given at various ages, according to a schedule. Your health care provider will recommend vaccines for you based on your age, medical history, and lifestyle or other factors, such as travel or where you work.   What tests do I need? Blood tests  Lipid and cholesterol levels. These may be checked every 5 years, or more often if you are over 62 years old.  Hepatitis C test.  Hepatitis B test. Screening  Lung cancer screening. You may have this screening every year starting at age 62 if you have a 30-pack-year history of smoking and currently smoke or have quit within the past 15 years.  Colorectal cancer  screening. ? All adults should have this screening starting at age 62 and continuing until age 17. ? Your health care provider may recommend screening at age 62 if you are at increased risk. ? You will have tests every 1-10 years, depending on your results and the type of screening test.  Diabetes screening. ? This is done by checking your blood sugar (glucose) after you have not eaten for a while (fasting). ? You may have this done every 1-3 years.  Mammogram. ? This may be done every 1-2 years. ? Talk with your health care provider about when you should start having regular mammograms. This may depend on whether you have a family history of breast cancer.  BRCA-related cancer screening. This may be done if you have a family history of breast, ovarian, tubal, or peritoneal cancers.  Pelvic exam and Pap test. ? This may be done every 3 years starting at age 62. ? Starting at age 62, this may be done every 5 years if you have a Pap test in combination with an HPV test. Other tests  STD (sexually transmitted disease) testing, if you are at risk.  Bone density scan. This is done to screen for osteoporosis. You may have this scan if you are at high risk for osteoporosis. Talk with your health care provider about your test results, treatment options, and if necessary, the need for more tests. Follow these instructions at home: Eating and drinking  Eat a diet that includes fresh fruits and vegetables, whole grains, lean protein, and low-fat dairy products.  Take vitamin and mineral supplements  as recommended by your health care provider.  Do not drink alcohol if: ? Your health care provider tells you not to drink. ? You are pregnant, may be pregnant, or are planning to become pregnant.  If you drink alcohol: ? Limit how much you have to 0-1 drink a day. ? Be aware of how much alcohol is in your drink. In the [place] one drink equals one 12 oz bottle of beer (355 mL), one 5 oz glass of  wine (148 mL), or one 1 oz glass of hard liquor (44 mL).   Lifestyle  Take daily care of your teeth and gums. Brush your teeth every morning and night with fluoride toothpaste. Floss one time each day.  Stay active. Exercise for at least 30 minutes 5 or more days each week.  Do not use any products that contain nicotine or tobacco, such as cigarettes, e-cigarettes, and chewing tobacco. If you need help quitting, ask your health care provider.  Do not use drugs.  If you are sexually active, practice safe sex. Use a condom or other form of protection to prevent STIs (sexually transmitted infections).  If you do not wish to become pregnant, use a form of birth control. If you plan to become pregnant, see your health care provider for a prepregnancy visit.  If told by your health care provider, take low-dose aspirin daily starting at age 62.  Find healthy ways to cope with stress, such as: ? Meditation, yoga, or listening to music. ? Journaling. ? Talking to a trusted person. ? Spending time with friends and family. Safety  Always wear your seat belt while driving or riding in a vehicle.  Do not drive: ? If you have been drinking alcohol. Do not ride with someone who has been drinking. ? When you are tired or distracted. ? While texting.  Wear a helmet and other protective equipment during sports activities.  If you have firearms in your house, make sure you follow all gun safety procedures. What's next?  Visit your health care provider once a year for an annual wellness visit.  Ask your health care provider how often you should have your eyes and teeth checked.  Stay up to date on all vaccines. This information is not intended to replace advice given to you by your health care provider. Make sure you discuss any questions you have with your health care provider. Document Revised: 07/24/2020 Document Reviewed: 07/01/2018 Elsevier Patient Education  2021 [place] Breast self-awareness is knowing how your breasts look and feel. Doing breast self-awareness is important. It allows you to catch a breast problem early while it is still small and can be treated. All women should do breast self-awareness, including women who have had breast implants. Tell your doctor if you notice a change in your breasts. What you need:  A mirror.  A well-lit room. How to do a breast self-exam A breast self-exam is one way to learn what is normal for your breasts and to check for changes. To do a breast self-exam: Look for changes 1. Take off all the clothes above your waist. 2. Stand in front of a mirror in a room with good lighting. 3. Put your hands on your hips. 4. Push your hands down. 5. Look at your breasts and nipples in the mirror to see if one breast or nipple looks different from the other. Check to see if: ? The shape of one breast is different. ? The size of  one breast is different. ? There are wrinkles, dips, and bumps in one breast and not the other. 6. Look at each breast for changes in the skin, such as: ? Redness. ? Scaly areas. 7. Look for changes in your nipples, such as: ? Liquid around the nipples. ? Bleeding. ? Dimpling. ? Redness. ? A change in where the nipples are.   Feel for changes 1. Lie on your back on the floor. 2. Feel each breast. To do this, follow these steps: ? Pick a breast to feel. ? Put the arm closest to that breast above your head. ? Use your other arm to feel the nipple area of your breast. Feel the area with the pads of your three middle fingers by making small circles with your fingers. For the first circle, press lightly. For the second circle, press harder. For the third circle, press even harder. ? Keep making circles with your fingers at the different pressures as you move down your breast. Stop when you feel your ribs. ? Move your fingers a little toward the center of your body. ? Start making  circles with your fingers again, this time going up until you reach your collarbone. ? Keep making up-and-down circles until you reach your armpit. Remember to keep using the three pressures. ? Feel the other breast in the same way. 3. Sit or stand in the tub or shower. 4. With soapy water on your skin, feel each breast the same way you did in step 2 when you were lying on the floor.   Write down what you find Writing down what you find can help you remember what to tell your doctor. Write down:  What is normal for each breast.  Any changes you find in each breast, including: ? The kind of changes you find. ? Whether you have pain. ? Size and location of any lumps.  When you last had your menstrual period. General tips  Check your breasts every month.  If you are breastfeeding, the best time to check your breasts is after you feed your baby or after you use a breast pump.  If you get menstrual periods, the best time to check your breasts is 5-7 days after your menstrual period is over.  With time, you will become comfortable with the self-exam, and you will begin to know if there are changes in your breasts. Contact a doctor if you:  See a change in the shape or size of your breasts or nipples.  See a change in the skin of your breast or nipples, such as red or scaly skin.  Have fluid coming from your nipples that is not normal.  Find a lump or thick area that was not there before.  Have pain in your breasts.  Have any concerns about your breast health. Summary  Breast self-awareness includes looking for changes in your breasts, as well as feeling for changes within your breasts.  Breast self-awareness should be done in front of a mirror in a well-lit room.  You should check your breasts every month. If you get menstrual periods, the best time to check your breasts is 5-7 days after your menstrual period is over.  Let your doctor know of any changes you see in your  breasts, including changes in size, changes on the skin, pain or tenderness, or fluid from your nipples that is not normal. This information is not intended to replace advice given to you by your health care provider. Make sure  you discuss any questions you have with your health care provider. Document Revised: 06/08/2018 Document Reviewed: 06/08/2018 Elsevier Patient Education  [place].

## 2021-04-18 ENCOUNTER — Encounter: Payer: Self-pay | Admitting: Family Medicine

## 2021-04-18 ENCOUNTER — Ambulatory Visit (INDEPENDENT_AMBULATORY_CARE_PROVIDER_SITE_OTHER): Payer: BLUE CROSS/BLUE SHIELD | Admitting: Family Medicine

## 2021-04-18 ENCOUNTER — Other Ambulatory Visit: Payer: Self-pay

## 2021-04-18 VITALS — BP 108/66 | HR 69 | Temp 97.9°F | Resp 16 | Wt 169.0 lb

## 2021-04-18 DIAGNOSIS — E559 Vitamin D deficiency, unspecified: Secondary | ICD-10-CM | POA: Diagnosis not present

## 2021-04-18 DIAGNOSIS — E860 Dehydration: Secondary | ICD-10-CM | POA: Diagnosis not present

## 2021-04-18 DIAGNOSIS — R531 Weakness: Secondary | ICD-10-CM

## 2021-04-18 DIAGNOSIS — K2900 Acute gastritis without bleeding: Secondary | ICD-10-CM

## 2021-04-18 DIAGNOSIS — E538 Deficiency of other specified B group vitamins: Secondary | ICD-10-CM

## 2021-04-18 NOTE — Progress Notes (Signed)
I,[name],acting as a scribe for [name], MD.,have documented all relevant documentation on the behalf of [name], MD,as directed by  [name], MD while in the presence of [name], MD.   Established patient visit   Patient: Angela Thomas   DOB: 1961-04-01   60 y.o. Female  MRN: [id number] Visit Date: 04/18/2021  Today's healthcare provider: [name], MD   Chief Complaint  Patient presents with   Fatigue   Subjective    HPI  Patient states she has had fatigue, lightheadedness and weakness for about 2 weeks. Patient states 2 of her co-workers were tested positive for covid recently. However, she has been tested twice and both tests were negative. Patient states her symptoms started 2 weeks ago with diarrhea that lasted 4 days. Patient is still having symptoms of stomach discomfort and gas (bulching).   Patient was seen in the ED on first with chest pain and GI symptoms.  ED work-up was negative.      Medications: Outpatient Medications Prior to Visit  Medication Sig   cycloSPORINE (RESTASIS) 0.05 % ophthalmic emulsion Place 1 drop into both eyes every 12 (twelve) hours.   diltiazem (CARDIZEM CD) 180 MG 24 hr capsule Take 1 capsule (180 mg total) by mouth daily. Pt to let us know if she picks up and starts it.   mesalamine (LIALDA) 1.2 g EC tablet TAKE ONE TABLET BY MOUTH TWICE DAILY (Patient taking differently: daily with breakfast.)   NON FORMULARY Over-The-Counter Beet Supplement 2 capsules daily   traZODone (DESYREL) 100 MG tablet Take 1 tablet (100 mg total) by mouth at bedtime. Take 1 1/2 tablets at bedtime   valACYclovir (VALTREX) 500 MG tablet TAKE ONE TABLET BY MOUTH TWICE DAILY AS NEEDED   No facility-administered medications prior to visit.    Review of Systems      Objective    BP 108/66 (BP Location: Right Arm, Patient Position: Sitting, Cuff Size: Normal)   Pulse 69   Temp 97.9 F (36.6 C)  (Temporal)   Resp 16   Wt 169 lb (76.7 kg)   SpO2 98%   BMI 31.93 kg/m  BP Readings from Last 3 Encounters:  04/18/21 108/66  02/13/21 118/70  12/03/20 134/82   Wt Readings from Last 3 Encounters:  04/18/21 169 lb (76.7 kg)  02/13/21 170 lb 1.6 oz (77.2 kg)  12/03/20 173 lb 3.2 oz (78.6 kg)       Physical Exam Vitals reviewed.  Constitutional:      Appearance: Normal appearance.  HENT:     Head: Normocephalic and atraumatic.     Right Ear: External ear normal.     Left Ear: External ear normal.  Eyes:     General: No scleral icterus.    Conjunctiva/sclera: Conjunctivae normal.  Cardiovascular:     Rate and Rhythm: Normal rate and regular rhythm.     Pulses: Normal pulses.     Heart sounds: Normal heart sounds.  Pulmonary:     Effort: Pulmonary effort is normal.     Breath sounds: Normal breath sounds.  Abdominal:     Palpations: Abdomen is soft.  Skin:    General: Skin is warm and dry.  Neurological:     Mental Status: She is alert and oriented to person, place, and time. Mental status is at baseline.  Psychiatric:        Mood and Affect: Mood normal.  Behavior: Behavior normal.        Thought Content: Thought content normal.        Judgment: Judgment normal.      No results found for any visits on 04/18/21.  Assessment & Plan     1. Vitamin D deficiency Level - CBC with Differential/Platelet - VITAMIN D 25 Hydroxy (Vit-D Deficiency, Fractures)  2. Vitamin B 12 deficiency Check level - CBC with Differential/Platelet - B12 and Folate Panel  3. Weakness Was dehydrated definitely.  Stay hydrated and check labs.  He is slowly improving.  Cardiac work was up was negative.  Sitter low-dose statin for patient for hypercholesterolemia - CBC with Differential/Platelet - B12 and Folate Panel  4. Dehydration Push fluids - Comp. Metabolic Panel (12)  5. Acute gastritis without hemorrhage, unspecified gastritis type Consider PPI.  Probiotic for  diarrhea. - CBC with Differential/Platelet - Comp. Metabolic Panel (12)   No follow-ups on file.      I, [name], MD, have reviewed all documentation for this visit. The documentation on 04/25/21 for the exam, diagnosis, procedures, and orders are all accurate and complete.    Saree Krogh Cranford Mon, MD  [facility] [phone] (phone) [phone] (fax)  [place]

## 2021-04-19 LAB — COMP. METABOLIC PANEL (12)
AST: 22 IU/L (ref 0–40)
Albumin/Globulin Ratio: 2.2 (ref 1.2–2.2)
Albumin: 4.7 g/dL (ref 3.8–4.8)
Alkaline Phosphatase: 79 IU/L (ref 44–121)
BUN/Creatinine Ratio: 15 (ref 12–28)
BUN: 11 mg/dL (ref 8–27)
Bilirubin Total: <0.2 mg/dL (ref 0.0–1.2)
Calcium: 9.8 mg/dL (ref 8.7–10.3)
Chloride: 99 mmol/L (ref 96–106)
Creatinine, Ser: 0.71 mg/dL (ref 0.57–1.00)
Globulin, Total: 2.1 g/dL (ref 1.5–4.5)
Glucose: 103 mg/dL — ABNORMAL HIGH (ref 65–99)
Potassium: 4.2 mmol/L (ref 3.5–5.2)
Sodium: 142 mmol/L (ref 134–144)
Total Protein: 6.8 g/dL (ref 6.0–8.5)
eGFR: 96 mL/min/{1.73_m2} (ref >59)

## 2021-04-19 LAB — CBC WITH DIFFERENTIAL/PLATELET
Basophils Absolute: 0 10*3/uL (ref 0.0–0.2)
Basos: 0 %
EOS (ABSOLUTE): 0 10*3/uL (ref 0.0–0.4)
Eos: 1 %
Hematocrit: 41.1 % (ref 34.0–46.6)
Hemoglobin: 13.8 g/dL (ref 11.1–15.9)
Immature Grans (Abs): 0 10*3/uL (ref 0.0–0.1)
Immature Granulocytes: 0 %
Lymphocytes Absolute: 1.4 10*3/uL (ref 0.7–3.1)
Lymphs: 21 %
MCH: 32.5 pg (ref 26.6–33.0)
MCHC: 33.6 g/dL (ref 31.5–35.7)
MCV: 97 fL (ref 79–97)
Monocytes Absolute: 0.6 10*3/uL (ref 0.1–0.9)
Monocytes: 8 %
Neutrophils Absolute: 4.9 10*3/uL (ref 1.4–7.0)
Neutrophils: 70 %
Platelets: 329 10*3/uL (ref 150–450)
RBC: 4.25 x10E6/uL (ref 3.77–5.28)
RDW: 12.3 % (ref 11.7–15.4)
WBC: 7 10*3/uL (ref 3.4–10.8)

## 2021-04-19 LAB — VITAMIN D 25 HYDROXY (VIT D DEFICIENCY, FRACTURES): Vit D, 25-Hydroxy: 34.5 ng/mL (ref 30.0–100.0)

## 2021-04-19 LAB — B12 AND FOLATE PANEL
Folate: 7.9 ng/mL (ref >3.0)
Vitamin B-12: 620 pg/mL (ref 232–1245)

## 2021-05-08 ENCOUNTER — Other Ambulatory Visit: Payer: Self-pay | Admitting: Family Medicine

## 2021-05-08 DIAGNOSIS — G4700 Insomnia, unspecified: Secondary | ICD-10-CM

## 2021-05-15 ENCOUNTER — Telehealth: Payer: Self-pay

## 2021-05-15 NOTE — Telephone Encounter (Signed)
Copied from [place] [id number]. Topic: General - Other >> May 15, 2021 10:41 AM Pawlus, Brayton Layman A wrote: Reason for CRM: Pt called in to report to Dr Rosanna Randy that she tested positive for COVID today (7/13) FYI.

## 2021-05-15 NOTE — Telephone Encounter (Signed)
No answer and no vm. Will try again later.  

## 2021-05-16 NOTE — Telephone Encounter (Signed)
Patient states for right now she like does not need a medication. However she may call back and ask for the rx in a couple of days if her symptoms worsen.

## 2021-07-02 ENCOUNTER — Telehealth: Payer: Self-pay

## 2021-07-02 ENCOUNTER — Other Ambulatory Visit: Payer: Self-pay | Admitting: Gastroenterology

## 2021-07-02 DIAGNOSIS — K5100 Ulcerative (chronic) pancolitis without complications: Secondary | ICD-10-CM

## 2021-07-02 MED ORDER — MESALAMINE 1.2 G PO TBEC: 1.2000 g | DELAYED_RELEASE_TABLET | Freq: Two times a day (BID) | ORAL | 1 refills | Status: DC

## 2021-07-02 NOTE — Telephone Encounter (Signed)
Please review.  Is it okay to refer?   Thanks,   -Mickel Baas

## 2021-07-02 NOTE — Telephone Encounter (Signed)
Last appointment was 10/23/2021. Please advised if 6 months can be prescribed

## 2021-07-02 NOTE — Telephone Encounter (Signed)
Copied from [place] [phone]. Topic: Referral - Question >> Jul 02, 2021  1:54 PM Pawlus, Brayton Layman A wrote: Reason for CRM: Pt wanted to know if Dr Rosanna Randy could send a referral to [facility], pt wanted to see Franchot Gallo, MD. Please fax referral [phone].

## 2021-07-02 NOTE — Telephone Encounter (Signed)
mesalamine (LIALDA) 1.2 g EC tablet  [facility]  30 day  Due to her insurance being out of network she will need at least 6 mth refill until she can get an appt with a [place].

## 2021-07-03 ENCOUNTER — Other Ambulatory Visit: Payer: Self-pay | Admitting: *Deleted

## 2021-07-03 DIAGNOSIS — K5100 Ulcerative (chronic) pancolitis without complications: Secondary | ICD-10-CM

## 2021-07-03 NOTE — Telephone Encounter (Signed)
Referral ordered

## 2021-08-01 ENCOUNTER — Encounter: Payer: Self-pay | Admitting: Family Medicine

## 2021-08-07 ENCOUNTER — Other Ambulatory Visit: Payer: Self-pay | Admitting: Internal Medicine

## 2021-08-07 MED ORDER — DILTIAZEM HCL ER COATED BEADS 180 MG PO CP24
180.0000 mg | ORAL_CAPSULE | Freq: Every day | ORAL | 0 refills | Status: DC
Start: 2021-08-07 — End: 2021-10-10

## 2021-09-04 ENCOUNTER — Ambulatory Visit: Payer: Self-pay | Admitting: *Deleted

## 2021-09-04 ENCOUNTER — Other Ambulatory Visit: Payer: Self-pay | Admitting: Family Medicine

## 2021-09-04 DIAGNOSIS — G4700 Insomnia, unspecified: Secondary | ICD-10-CM

## 2021-09-04 NOTE — Telephone Encounter (Signed)
Requested Prescriptions  Pending Prescriptions Disp Refills  . traZODone (DESYREL) 100 MG tablet [Pharmacy Med Name: TRAZODONE HCL 100 MG TAB] 45 tablet 4    Sig: TAKE 1 AND 1/2 TABLET BY MOUTH DAILY AT BEDTIME.     Psychiatry: Antidepressants - Serotonin Modulator Passed - 09/04/2021 11:24 AM      Passed - Valid encounter within last 6 months    Recent Outpatient Visits          4 months ago Vitamin D deficiency   [facility] Jerrol Banana., MD   10 months ago Annual physical exam   [facility] Jerrol Banana., MD   1 year ago Ulcerative pancolitis without complication [facility])   [facility] Jerrol Banana., MD   1 year ago Essential hypertension   [facility] Jerrol Banana., MD   1 year ago Annual physical exam   [facility] Jerrol Banana., MD      Future Appointments            In 4 months Jerrol Banana., MD [facility] [place]

## 2021-09-04 NOTE — Telephone Encounter (Signed)
Reason for Disposition  Age > 50 years  Answer Assessment - Initial Assessment Questions 1. SYMPTOM: "What's the main symptom you're concerned about?" (e.g., frequency, incontinence)     Low back pain, flank pain, low abd. Pain  2. ONSET: "When did the  symptoms   start?"     3-4 days ago  3. PAIN: "Is there any pain?" If Yes, ask: "How bad is it?" (Scale: 1-10; mild, moderate, severe)     5-6  4. CAUSE: "What do you think is causing the symptoms?"     UTI 5. OTHER SYMPTOMS: "Do you have any other symptoms?" (e.g., fever, flank pain, blood in urine, pain with urination)     Itching end of urinating occasional burning  6. PREGNANCY: "Is there any chance you are pregnant?" "When was your last menstrual period?"     na  Answer Assessment - Initial Assessment Questions 1. SEVERITY: "How bad is the pain?"  (e.g., Scale 1-10; mild, moderate, or severe)   - MILD (1-3): complains slightly about urination hurting   - MODERATE (4-7): interferes with normal activities     - SEVERE (8-10): excruciating, unwilling or unable to urinate because of the pain      Pain level 5-6  2. FREQUENCY: "How many times have you had painful urination today?"      no 3. PATTERN: "Is pain present every time you urinate or just sometimes?"      Occasional burning, itching when finishing urinating  4. ONSET: "When did the painful urination start?"      3-4 days ago  5. FEVER: "Do you have a fever?" If Yes, ask: "What is your temperature, how was it measured, and when did it start?"     no 6. PAST UTI: "Have you had a urine infection before?" If Yes, ask: "When was the last time?" and "What happened that time?"      Yes  7. CAUSE: "What do you think is causing the painful urination?"  (e.g., UTI, scratch, Herpes sore)     UTI 8. OTHER SYMPTOMS: "Do you have any other symptoms?" (e.g., flank pain, vaginal discharge, genital sores, urgency, blood in urine)     Low abd. Pain, flank pain, back pain  9. PREGNANCY: "Is  there any chance you are pregnant?" "When was your last menstrual period?"     na  Protocols used: Urinary Symptoms-A-AH, Urination Pain - Female-A-AH

## 2021-09-04 NOTE — Telephone Encounter (Signed)
Pt calling stating that she has a possible uti. No appts between any provider. Please advise.   Called patient to review symptoms. C/o UTI symptoms. Started 3-4 days ago . C/o itching at end of urinating, occasional burning, low abd. Back and flank pain. Feeling exhausted. Denies fever, blood in urine, no frequency, no retention. No incontinence. C/o cloudy , clear urine. Odor noted and different than usual but not foul smelling. No available appt noted. Please advise . Patent go AZO strips and noted leukocytes present. Care advise given. Patient verbalized understanding of care advise and to call back or go to [facility] or ED if symptoms worsen. Patient requesting a call back with what treatment recommended from PCP.

## 2021-09-05 NOTE — Telephone Encounter (Signed)
Appointment made for tomorrow

## 2021-09-06 ENCOUNTER — Ambulatory Visit: Payer: BLUE CROSS/BLUE SHIELD | Admitting: Physician Assistant

## 2021-09-06 NOTE — Progress Notes (Deleted)
      Established patient visit   Patient: Angela Thomas   DOB: 1961/06/04   60 y.o. Female  MRN: [id number] Visit Date: 09/06/2021  Today's healthcare provider: Mikey Kirschner, PA-C   No chief complaint on file.  Subjective    HPI  ***  {Link to patient history deactivated due to formatting error:1}  Medications: Outpatient Medications Prior to Visit  Medication Sig   cycloSPORINE (RESTASIS) 0.05 % ophthalmic emulsion Place 1 drop into both eyes every 12 (twelve) hours.   diltiazem (CARDIZEM CD) 180 MG 24 hr capsule Take 1 capsule (180 mg total) by mouth daily. Please make yearly appt with Dr. Harrington Challenger for January 2023 for future refills. Thank you 1st attempt   mesalamine (LIALDA) 1.2 g EC tablet Take 1 tablet (1.2 g total) by mouth 2 (two) times daily.   NON FORMULARY Over-The-Counter Beet Supplement 2 capsules daily   traZODone (DESYREL) 100 MG tablet TAKE 1 AND 1/2 TABLET BY MOUTH DAILY AT BEDTIME.   valACYclovir (VALTREX) 500 MG tablet TAKE ONE TABLET BY MOUTH TWICE DAILY AS NEEDED   No facility-administered medications prior to visit.    Review of Systems  {Labs  Heme  Chem  Endocrine  Serology  Results Review (optional):23779}   Objective    There were no vitals taken for this visit. {Show previous vital signs (optional):23777}  Physical Exam  ***  No results found for any visits on 09/06/21.  Assessment & Plan     ***  No follow-ups on file.      {provider attestation***:1}   Mikey Kirschner, PA-C  [facility] [phone] (phone) [phone] (fax)  [place]

## 2021-10-10 ENCOUNTER — Other Ambulatory Visit: Payer: Self-pay | Admitting: Internal Medicine

## 2021-10-21 ENCOUNTER — Ambulatory Visit: Payer: Self-pay | Admitting: *Deleted

## 2021-10-21 NOTE — Telephone Encounter (Signed)
° °  Chief Complaint: abdominal pain , cramping, mid sternum, right side under ribs to back Symptoms: abd. Pressure, pain, nausea, stool green/ white in color, foul smelling , tender to touch , cramping now  Frequency: 4-5 days getting worse  Pertinent Negatives: Patient denies fever, vomiting,  Disposition: [] ED /[x] Urgent Care (no appt availability in office) / [] Appointment(In office/virtual)/ [x]  [facility]/ [] Home Care/ [] Refused Recommended Disposition  Additional Notes:   Requesting to see provider in office and not go to UC or ED. Offerred E visit [place] . Declined .  Reports she can wait and would like appt Thursday if still available per PCP . Requesting Jiles Garter to call her back.            Reason for Disposition  [1] MILD pain (e.g., does not interfere with normal activities) AND [2] pain comes and goes (cramps) AND [3] present > 48 hours  (Exception: this same abdominal pain is a chronic symptom recurrent or ongoing AND present > 4 weeks)  Answer Assessment - Initial Assessment Questions 1. LOCATION: "Where does it hurt?"      Mid sternum to right side under ribs to back  2. RADIATION: "Does the pain shoot anywhere else?" (e.g., chest, back)     To back  3. ONSET: "When did the pain begin?" (e.g., minutes, hours or days ago)      Started 4 weeks ago and becoming worse 4-5 days ago  4. SUDDEN: "Gradual or sudden onset?"     Na  5. PATTERN "Does the pain come and go, or is it constant?"    - If constant: "Is it getting better, staying the same, or worsening?"      (Note: Constant means the pain never goes away completely; most serious pain is constant and it progresses)     - If intermittent: "How long does it last?" "Do you have pain now?"     (Note: Intermittent means the pain goes away completely between bouts)     Abdominal pain comes and goes x couple of minutes  6. SEVERITY: "How bad is the pain?"  (e.g., Scale 1-10; mild, moderate, or severe)   -  MILD (1-3): doesn't interfere with normal activities, abdomen soft and not tender to touch    - MODERATE (4-7): interferes with normal activities or awakens from sleep, abdomen tender to touch    - SEVERE (8-10): excruciating pain, doubled over, unable to do any normal activities      3 , tender to touch  7. RECURRENT SYMPTOM: "Have you ever had this type of stomach pain before?" If Yes, ask: "When was the last time?" and "What happened that time?"      Yes  8. CAUSE: "What do you think is causing the stomach pain?"     Not sure  9. RELIEVING/AGGRAVATING FACTORS: "What makes it better or worse?" (e.g., movement, antacids, bowel movement)     na 10. OTHER SYMPTOMS: "Do you have any other symptoms?" (e.g., back pain, diarrhea, fever, urination pain, vomiting)       Right side back pain , nausea, pressure, indigestion  11. PREGNANCY: "Is there any chance you are pregnant?" "When was your last menstrual period?"       na  Protocols used: Abdominal Pain - [facility]

## 2021-10-22 ENCOUNTER — Ambulatory Visit (INDEPENDENT_AMBULATORY_CARE_PROVIDER_SITE_OTHER): Payer: BLUE CROSS/BLUE SHIELD | Admitting: Family Medicine

## 2021-10-22 ENCOUNTER — Encounter: Payer: Self-pay | Admitting: Family Medicine

## 2021-10-22 ENCOUNTER — Other Ambulatory Visit: Payer: Self-pay

## 2021-10-22 VITALS — BP 129/70 | HR 79 | Temp 98.7°F | Resp 16 | Wt 158.0 lb

## 2021-10-22 DIAGNOSIS — R1013 Epigastric pain: Secondary | ICD-10-CM | POA: Diagnosis not present

## 2021-10-22 DIAGNOSIS — R1011 Right upper quadrant pain: Secondary | ICD-10-CM | POA: Diagnosis not present

## 2021-10-22 NOTE — Progress Notes (Signed)
Established patient visit   Patient: Angela Thomas   DOB: 17-Oct-1961   60 y.o. Female  MRN: [id number] Visit Date: 10/22/2021  Today's healthcare provider: Lelon Huh, MD   Chief Complaint  Patient presents with   Abdominal Pain   Subjective    Abdominal Pain This is a new problem. Episode onset: 5 days ago. The problem occurs intermittently. The pain is located in the epigastric region (right side under ribs to back). The quality of the pain is cramping (also pressure like pain). Associated symptoms include flatus and nausea. Pertinent negatives include no constipation, diarrhea, dysuria, fever or vomiting. Associated symptoms comments: white colored and foul smelling. The pain is aggravated by palpation. Relieved by: topical essential oils.   Pain occurs after eating.    Medications: Outpatient Medications Prior to Visit  Medication Sig   cycloSPORINE (RESTASIS) 0.05 % ophthalmic emulsion Place 1 drop into both eyes every 12 (twelve) hours.   diltiazem (CARDIZEM CD) 180 MG 24 hr capsule TAKE 1 CAPSULE BY MOUTH DAILY. PLEASE MAKE YEARLY APPOINTMENT WITH DR.   mesalamine (LIALDA) 1.2 g EC tablet Take 1 tablet (1.2 g total) by mouth 2 (two) times daily.   traZODone (DESYREL) 100 MG tablet TAKE 1 AND 1/2 TABLET BY MOUTH DAILY AT BEDTIME.   valACYclovir (VALTREX) 500 MG tablet TAKE ONE TABLET BY MOUTH TWICE DAILY AS NEEDED   gabapentin (NEURONTIN) 300 MG capsule Take 300 mg by mouth 3 (three) times daily as needed.   meloxicam (MOBIC) 7.5 MG tablet 1 Tablet(s) By Mouth Every 12 Hours   [DISCONTINUED] NON FORMULARY Over-The-Counter Beet Supplement 2 capsules daily (Patient not taking: Reported on 10/22/2021)   No facility-administered medications prior to visit.    Review of Systems  Constitutional:  Negative for appetite change, chills, fatigue and fever.  Respiratory:  Negative for chest tightness and shortness of breath.   Cardiovascular:  Negative for chest pain and  palpitations.  Gastrointestinal:  Positive for abdominal pain, flatus and nausea. Negative for constipation, diarrhea and vomiting.  Genitourinary:  Negative for dysuria.  Neurological:  Negative for dizziness and weakness.      Objective    BP 129/70 (BP Location: Left Arm, Patient Position: Sitting, Cuff Size: Normal)    Pulse 79    Temp 98.7 F (37.1 C) (Oral)    Resp 16    Wt 158 lb (71.7 kg)    SpO2 100% Comment: room air   BMI 29.85 kg/m  {Show previous vital signs (optional):23777}  Physical Exam   General Appearance:     Well developed, well nourished female, alert, cooperative, in no acute distress  Eyes:    PERRL, conjunctiva/corneas clear, EOM's intact       Lungs:     Clear to auscultation bilaterally, respirations unlabored  Heart:    Normal heart rate. Normal rhythm. No murmurs, rubs, or gallops.    Abdomen:   bowel sounds present and normal in all 4 quadrants, soft, round. No masses. No rebound. Is tender in epigastrium and especially in RUQ.          Assessment & Plan     1. Epigastric pain  - CBC - Amylase - Comprehensive metabolic panel - US Abdomen Limited; Future  2. RUQ pain  - US Abdomen Limited; Future  Bland diet, avoid fatty foods.       The entirety of the information documented in the History of Present Illness, Review of Systems and Physical Exam were  personally obtained by me. Portions of this information were initially documented by the CMA and reviewed by me for thoroughness and accuracy.     Lelon Huh, MD  [facility] [phone] (phone) [phone] (fax)  [place]

## 2021-10-23 LAB — COMPREHENSIVE METABOLIC PANEL
ALT: 20 IU/L (ref 0–32)
AST: 18 IU/L (ref 0–40)
Albumin/Globulin Ratio: 2 (ref 1.2–2.2)
Albumin: 4.5 g/dL (ref 3.8–4.8)
Alkaline Phosphatase: 61 IU/L (ref 44–121)
BUN/Creatinine Ratio: 16 (ref 12–28)
BUN: 9 mg/dL (ref 8–27)
Bilirubin Total: 0.2 mg/dL (ref 0.0–1.2)
CO2: 26 mmol/L (ref 20–29)
Calcium: 9.9 mg/dL (ref 8.7–10.3)
Chloride: 95 mmol/L — ABNORMAL LOW (ref 96–106)
Creatinine, Ser: 0.57 mg/dL (ref 0.57–1.00)
Globulin, Total: 2.3 g/dL (ref 1.5–4.5)
Glucose: 99 mg/dL (ref 70–99)
Potassium: 4.5 mmol/L (ref 3.5–5.2)
Sodium: 136 mmol/L (ref 134–144)
Total Protein: 6.8 g/dL (ref 6.0–8.5)
eGFR: 103 mL/min/{1.73_m2} (ref >59)

## 2021-10-23 LAB — CBC
Hematocrit: 37.9 % (ref 34.0–46.6)
Hemoglobin: 13.3 g/dL (ref 11.1–15.9)
MCH: 33.7 pg — ABNORMAL HIGH (ref 26.6–33.0)
MCHC: 35.1 g/dL (ref 31.5–35.7)
MCV: 96 fL (ref 79–97)
Platelets: 396 10*3/uL (ref 150–450)
RBC: 3.95 x10E6/uL (ref 3.77–5.28)
RDW: 12.8 % (ref 11.7–15.4)
WBC: 8.6 10*3/uL (ref 3.4–10.8)

## 2021-10-23 LAB — AMYLASE: Amylase: 58 U/L (ref 31–110)

## 2021-10-24 ENCOUNTER — Encounter: Payer: Self-pay | Admitting: Family Medicine

## 2021-11-04 ENCOUNTER — Telehealth: Payer: Self-pay | Admitting: Family Medicine

## 2021-11-04 ENCOUNTER — Other Ambulatory Visit: Payer: Self-pay | Admitting: Family Medicine

## 2021-11-04 DIAGNOSIS — R1013 Epigastric pain: Secondary | ICD-10-CM

## 2021-11-04 DIAGNOSIS — R1011 Right upper quadrant pain: Secondary | ICD-10-CM

## 2021-11-04 NOTE — Telephone Encounter (Signed)
The abdominal ultrasound was normal. No sign of any gallbladder problems. If still having pain then recommend h. Pylori breath test to rule out duodenitis and ulcers.

## 2021-11-05 NOTE — Telephone Encounter (Signed)
Pt states the pain has improve some.  It is not as constant but still comes and goes some.  Pt agreed to the breath test if you still think it's necessary.    Thanks,   -Mickel Baas

## 2021-11-05 NOTE — Telephone Encounter (Signed)
Have placed and signed order for h. Pylori breath test.

## 2021-11-05 NOTE — Telephone Encounter (Signed)
Pt advised.   Thanks,   -Maalik Pinn  

## 2021-11-08 LAB — H. PYLORI BREATH TEST: H pylori Breath Test: NEGATIVE

## 2022-01-06 ENCOUNTER — Ambulatory Visit (INDEPENDENT_AMBULATORY_CARE_PROVIDER_SITE_OTHER): Payer: BLUE CROSS/BLUE SHIELD | Admitting: Family Medicine

## 2022-01-06 ENCOUNTER — Encounter: Payer: Self-pay | Admitting: Family Medicine

## 2022-01-06 ENCOUNTER — Other Ambulatory Visit: Payer: Self-pay

## 2022-01-06 VITALS — BP 112/76 | HR 61 | Temp 98.1°F | Resp 16 | Wt 161.0 lb

## 2022-01-06 DIAGNOSIS — J309 Allergic rhinitis, unspecified: Secondary | ICD-10-CM

## 2022-01-06 DIAGNOSIS — R739 Hyperglycemia, unspecified: Secondary | ICD-10-CM

## 2022-01-06 DIAGNOSIS — T781XXA Other adverse food reactions, not elsewhere classified, initial encounter: Secondary | ICD-10-CM

## 2022-01-06 DIAGNOSIS — Z0000 Encounter for general adult medical examination without abnormal findings: Secondary | ICD-10-CM | POA: Diagnosis not present

## 2022-01-06 DIAGNOSIS — E6609 Other obesity due to excess calories: Secondary | ICD-10-CM | POA: Diagnosis not present

## 2022-01-06 DIAGNOSIS — E785 Hyperlipidemia, unspecified: Secondary | ICD-10-CM | POA: Diagnosis not present

## 2022-01-06 DIAGNOSIS — Z8542 Personal history of malignant neoplasm of other parts of uterus: Secondary | ICD-10-CM

## 2022-01-06 DIAGNOSIS — I10 Essential (primary) hypertension: Secondary | ICD-10-CM | POA: Diagnosis not present

## 2022-01-06 DIAGNOSIS — Z6832 Body mass index (BMI) 32.0-32.9, adult: Secondary | ICD-10-CM

## 2022-01-06 DIAGNOSIS — L570 Actinic keratosis: Secondary | ICD-10-CM

## 2022-01-06 NOTE — Progress Notes (Signed)
Complete physical exam  I,Angela Thomas,acting as a scribe for Angela Durie, MD.,have documented all relevant documentation on the behalf of Angela Durie, MD,as directed by  Angela Durie, MD while in the presence of Angela Durie, MD.   Patient: Angela Thomas   DOB: Sep 05, 1961   61 y.o. Female  MRN: [id number] Visit Date: 01/06/2022  Today's healthcare provider: [name], MD   Chief Complaint  Patient presents with   Annual Exam   Subjective    Angela Thomas is a 61 y.o. female who presents today for a sleep physical. She reports consuming a general diet. Home exercise routine includes waalking. She generally feels well. She reports sleeping well. She does not have additional problems to discuss today.  HPI  Patient has chronic low back pain with left sciatica.  She has a colonoscopy scheduled next month. She needs a new GYN due to her insurance due to her history of uterine cancer.  She needs a gynecologist with [facility]   Past Medical History:  Diagnosis Date   Cancer [facility])    Endometrial   Colitis    Colitis    DDD (degenerative disc disease), lumbar    Past Surgical History:  Procedure Laterality Date   ABDOMINAL HYSTERECTOMY     total   BREAST CYST ASPIRATION Bilateral    neg   BUNIONECTOMY     BUNIONECTOMY     With hammer toe repair   COLONOSCOPY N/A 06/15/2017   Procedure: COLONOSCOPY;  Surgeon: Lin Landsman, MD;  Location: [facility] ENDOSCOPY;  Service: Endoscopy;  Laterality: N/A;   CYST REMOVAL NECK     HAMMER TOE SURGERY     TONSILLECTOMY AND ADENOIDECTOMY     Social History   Socioeconomic History   Marital status: Divorced    Spouse name: Not on file   Number of children: Not on file   Years of education: Not on file   Highest education level: Not on file  Occupational History   Not on file  Tobacco Use   Smoking status: Never   Smokeless tobacco: Never  Vaping Use   Vaping Use: Never used   Substance and Sexual Activity   Alcohol use: Yes    Alcohol/week: 1.0 standard drink    Types: 1 Glasses of wine per week    Comment: occas   Drug use: No   Sexual activity: Not Currently  Other Topics Concern   Not on file  Social History Narrative   Not on file   Social Determinants of Health   Financial Resource Strain: Not on file  Food Insecurity: Not on file  Transportation Needs: Not on file  Physical Activity: Not on file  Stress: Not on file  Social Connections: Not on file  Intimate Partner Violence: Not on file   Family Status  Relation Name Status   Mother  Deceased at age 6   Father  Deceased at age 51   Sister  Alive   Sister  Alive   Sister  Alive   Daughter  Angela Thomas  Deceased   Mat Uncle  (Not Specified)   MGF  (Not Specified)   PGF  (Not Specified)   PGM  (Not Specified)   Neg Hx  (Not Specified)   Family History  Problem Relation Age of Onset   COPD Mother    COPD Father    Diabetes Father    Pulmonary fibrosis Father  Bladder Cancer Father    Breast cancer Maternal Aunt    Dementia Maternal Aunt    Transient ischemic attack Maternal Uncle    Heart disease Maternal Uncle    Cancer Maternal Uncle        laryngeal   Heart disease Maternal Grandfather    Diabetes Paternal Grandfather    Prostate cancer Paternal Grandfather    Other Paternal Grandmother        cerebral hemorrhage   Ovarian cancer Neg Hx    Allergies  Allergen Reactions   Beef-Derived Products Anaphylaxis   Other Anaphylaxis    Reaction to red meat and pork products   Pork-Derived Products Anaphylaxis    Patient Care Team: Jerrol Banana., MD as PCP - General (Family Medicine) Fay Records, MD as PCP - Cardiology (Cardiology)   Medications: Outpatient Medications Prior to Visit  Medication Sig   cycloSPORINE (RESTASIS) 0.05 % ophthalmic emulsion Place 1 drop into both eyes every 12 (twelve) hours.   diltiazem (CARDIZEM CD) 180 MG 24 hr capsule  TAKE 1 CAPSULE BY MOUTH DAILY. PLEASE MAKE YEARLY APPOINTMENT WITH DR.   gabapentin (NEURONTIN) 300 MG capsule Take 300 mg by mouth 3 (three) times daily as needed.   mesalamine (LIALDA) 1.2 g EC tablet Take 1 tablet (1.2 g total) by mouth 2 (two) times daily.   traZODone (DESYREL) 100 MG tablet TAKE 1 AND 1/2 TABLET BY MOUTH DAILY AT BEDTIME.   TURMERIC PO Take by mouth daily.   valACYclovir (VALTREX) 500 MG tablet TAKE ONE TABLET BY MOUTH TWICE DAILY AS NEEDED   [DISCONTINUED] meloxicam (MOBIC) 7.5 MG tablet 1 Tablet(s) By Mouth Every 12 Hours (Patient not taking: Reported on 01/06/2022)   No facility-administered medications prior to visit.    Review of Systems  All other systems reviewed and are negative.     Objective    BP 112/76 (BP Location: Left Arm, Patient Position: Sitting, Cuff Size: Large)    Pulse 61    Temp 98.1 F (36.7 C) (Temporal)    Resp 16    Wt 161 lb (73 kg)    SpO2 98%    BMI 30.42 kg/m  BP Readings from Last 3 Encounters:  01/06/22 112/76  10/22/21 129/70  04/18/21 108/66   Wt Readings from Last 3 Encounters:  01/06/22 161 lb (73 kg)  10/22/21 158 lb (71.7 kg)  04/18/21 169 lb (76.7 kg)       Physical Exam Vitals reviewed.  Constitutional:      Appearance: She is well-developed.  HENT:     Head: Normocephalic and atraumatic.     Right Ear: External ear normal.     Left Ear: External ear normal.     Nose: Nose normal.  Eyes:     Conjunctiva/sclera: Conjunctivae normal.     Pupils: Pupils are equal, round, and reactive to light.  Cardiovascular:     Rate and Rhythm: Normal rate and regular rhythm.     Heart sounds: Normal heart sounds.  Pulmonary:     Effort: Pulmonary effort is normal.     Breath sounds: Normal breath sounds.  Abdominal:     General: Bowel sounds are normal.     Palpations: Abdomen is soft.  Musculoskeletal:     Cervical back: Normal range of motion and neck supple.  Skin:    General: Skin is warm and dry.   Neurological:     General: No focal deficit present.     Mental Status:  She is alert and oriented to person, place, and time.  Psychiatric:        Mood and Affect: Mood normal.        Behavior: Behavior normal.        Thought Content: Thought content normal.        Judgment: Judgment normal.      Last depression screening scores PHQ 2/9 Scores 01/06/2022 10/22/2020 10/19/2019  PHQ - 2 Score 0 0 0  PHQ- 9 Score 0 0 0   Last fall risk screening Fall Risk  01/06/2022  Falls in the past year? 0  Number falls in past yr: 0  Injury with Fall? 0  Risk for fall due to : No Fall Risks  Follow up Falls evaluation completed   Last Audit-C alcohol use screening Alcohol Use Disorder Test (AUDIT) 01/06/2022  1. How often do you have a drink containing alcohol? 1  2. How many drinks containing alcohol do you have on a typical day when you are drinking? 0  3. How often do you have six or more drinks on one occasion? 0  AUDIT-C Score 1   A score of 3 or more in women, and 4 or more in men indicates increased risk for alcohol abuse, EXCEPT if all of the points are from question 1   No results found for any visits on 01/06/22.  Assessment & Plan    Routine Health Maintenance and Physical Exam  Exercise Activities and Dietary recommendations  Goals   None     Immunization History  Administered Date(s) Administered   Influenza, High Dose Seasonal PF 08/22/2020   Influenza,inj,Quad PF,6+ Mos 08/17/2019   Influenza-Unspecified 09/03/2015, 08/03/2017, 07/22/2018, 08/23/2021   PFIZER(Purple Top)SARS-COV-2 Vaccination 02/01/2020, 02/29/2020   Tdap 12/09/2012    Health Maintenance  Topic Date Due   Zoster Vaccines- Shingrix (1 of 2) Never done   COVID-19 Vaccine (3 - Pfizer risk series) 03/28/2020   PAP SMEAR-Modifier  07/21/2021   MAMMOGRAM  08/09/2021   TETANUS/TDAP  12/09/2022   COLONOSCOPY (Pts 45-38yr Insurance coverage will need to be confirmed)  06/16/2027   INFLUENZA VACCINE   Completed   Hepatitis C Screening  Completed   HIV Screening  Completed   HPV VACCINES  Aged Out    Discussed health benefits of physical activity, and encouraged her to engage in regular exercise appropriate for her age and condition.  1. Annual physical exam  - Lipid panel - TSH - CBC w/Diff/Platelet - Comprehensive Metabolic Panel (CMET) - Hemoglobin A1c  2. Essential hypertension  - Lipid panel - TSH - CBC w/Diff/Platelet - Comprehensive Metabolic Panel (CMET) - Hemoglobin A1c  3. Hyperlipidemia, unspecified hyperlipidemia type  - Lipid panel - TSH - CBC w/Diff/Platelet - Comprehensive Metabolic Panel (CMET) - Hemoglobin A1c  4. Class 1 obesity due to excess calories without serious comorbidity with body mass index (BMI) of 32.0 to 32.9 in adult  - Lipid panel - TSH - CBC w/Diff/Platelet - Comprehensive Metabolic Panel (CMET) - Hemoglobin A1c  5. Allergic rhinitis, unspecified seasonality, unspecified trigger  - Lipid panel - TSH - CBC w/Diff/Platelet - Comprehensive Metabolic Panel (CMET) - Hemoglobin A1c  6. Hyperglycemia  - Lipid panel - TSH - CBC w/Diff/Platelet - Comprehensive Metabolic Panel (CMET) - Hemoglobin A1c  7. Allergic reaction to alpha-gal  - Alpha-Gal Panel  8. History of uterine cancer  - Ambulatory referral to Gynecology  9. AK (actinic keratosis)  - Ambulatory referral to Dermatology 10.  Low back pain  with left-sided sciatica  Return in about 1 year (around 01/07/2023).     I, Angela Durie, MD, have reviewed all documentation for this visit. The documentation on 01/09/22 for the exam, diagnosis, procedures, and orders are all accurate and complete.    Lashun Ramseyer Cranford Mon, MD  [facility] [phone] (phone) [phone] (fax)  [place]

## 2022-01-10 SURGERY — Surgical Case
Anesthesia: *Unknown

## 2022-01-15 ENCOUNTER — Other Ambulatory Visit: Payer: Self-pay | Admitting: Family Medicine

## 2022-01-17 LAB — COMPREHENSIVE METABOLIC PANEL
ALT: 18 IU/L (ref 0–32)
AST: 14 IU/L (ref 0–40)
Albumin/Globulin Ratio: 2.2 (ref 1.2–2.2)
Albumin: 4.8 g/dL (ref 3.8–4.8)
Alkaline Phosphatase: 70 IU/L (ref 44–121)
BUN/Creatinine Ratio: 18 (ref 12–28)
BUN: 12 mg/dL (ref 8–27)
Bilirubin Total: 0.3 mg/dL (ref 0.0–1.2)
CO2: 29 mmol/L (ref 20–29)
Calcium: 10.5 mg/dL — ABNORMAL HIGH (ref 8.7–10.3)
Chloride: 97 mmol/L (ref 96–106)
Creatinine, Ser: 0.65 mg/dL (ref 0.57–1.00)
Globulin, Total: 2.2 g/dL (ref 1.5–4.5)
Glucose: 90 mg/dL (ref 70–99)
Potassium: 4.6 mmol/L (ref 3.5–5.2)
Sodium: 138 mmol/L (ref 134–144)
Total Protein: 7 g/dL (ref 6.0–8.5)
eGFR: 99 mL/min/{1.73_m2} (ref >59)

## 2022-01-17 LAB — CBC WITH DIFFERENTIAL/PLATELET
Basophils Absolute: 0 10*3/uL (ref 0.0–0.2)
Basos: 0 %
EOS (ABSOLUTE): 0 10*3/uL (ref 0.0–0.4)
Eos: 1 %
Hematocrit: 42.4 % (ref 34.0–46.6)
Hemoglobin: 14.7 g/dL (ref 11.1–15.9)
Immature Grans (Abs): 0 10*3/uL (ref 0.0–0.1)
Immature Granulocytes: 0 %
Lymphocytes Absolute: 1.5 10*3/uL (ref 0.7–3.1)
Lymphs: 18 %
MCH: 33.3 pg — ABNORMAL HIGH (ref 26.6–33.0)
MCHC: 34.7 g/dL (ref 31.5–35.7)
MCV: 96 fL (ref 79–97)
Monocytes Absolute: 0.6 10*3/uL (ref 0.1–0.9)
Monocytes: 7 %
Neutrophils Absolute: 6.3 10*3/uL (ref 1.4–7.0)
Neutrophils: 74 %
Platelets: 333 10*3/uL (ref 150–450)
RBC: 4.41 x10E6/uL (ref 3.77–5.28)
RDW: 11.1 % — ABNORMAL LOW (ref 11.7–15.4)
WBC: 8.5 10*3/uL (ref 3.4–10.8)

## 2022-01-17 LAB — LIPID PANEL
Chol/HDL Ratio: 3.2 ratio (ref 0.0–4.4)
Cholesterol, Total: 256 mg/dL — ABNORMAL HIGH (ref 100–199)
HDL: 79 mg/dL (ref >39)
LDL Chol Calc (NIH): 161 mg/dL — ABNORMAL HIGH (ref 0–99)
Triglycerides: 96 mg/dL (ref 0–149)
VLDL Cholesterol Cal: 16 mg/dL (ref 5–40)

## 2022-01-17 LAB — HEMOGLOBIN A1C
Est. average glucose Bld gHb Est-mCnc: 111 mg/dL
Hgb A1c MFr Bld: 5.5 % (ref 4.8–5.6)

## 2022-01-17 LAB — ALPHA-GAL PANEL
Allergen Lamb IgE: 11.9 kU/L — AB
Beef IgE: 16 kU/L — AB
IgE (Immunoglobulin E), Serum: 220 IU/mL (ref 6–495)
O215-IgE Alpha-Gal: 20.3 kU/L — AB
Pork IgE: 11.1 kU/L — AB

## 2022-01-17 LAB — TSH: TSH: 1.1 u[IU]/mL (ref 0.450–4.500)

## 2022-01-21 ENCOUNTER — Telehealth: Payer: Self-pay

## 2022-01-21 NOTE — Telephone Encounter (Signed)
-----   Message from Jerrol Banana., MD sent at 01/21/2022  3:25 PM EDT ----- ?Labs stable.  Calcium slightly high.  Stop any calcium supplements and in 1 to 2 months to get calcium, ionized calcium PTH for hypercalcemia.  Please advise patient.  ?

## 2022-01-21 NOTE — Telephone Encounter (Signed)
Patient aware. Per patient she doesn't take any type of Supplement. The only thing that she takes is Turmeric.Reports that her diet is the same. Do you still want her to come back in 1 to 2 months for recheck? ?

## 2022-01-22 NOTE — Telephone Encounter (Signed)
LMOVM for pt to return call. Future labs are ordered. Okay for pec to advise patient. Thanks. ?

## 2022-01-23 ENCOUNTER — Other Ambulatory Visit: Payer: Self-pay | Admitting: *Deleted

## 2022-01-28 ENCOUNTER — Ambulatory Visit: Payer: Self-pay

## 2022-01-28 NOTE — Telephone Encounter (Signed)
? ? ? ?  Chief Complaint: Calcium supplementation  ?Symptoms: na ?Frequency: na ?Pertinent Negatives: Patient denies na ?Disposition: [] ED /[] Urgent Care (no appt availability in office) / [] Appointment(In office/virtual)/ []  [place] [facility]/ [] Home Care/ [] Refused Recommended Disposition /[] [facility]/ [x]  Follow-up with PCP ?Additional Notes: Per lab note pt was to stop calcium supplementation - pt is not taking any calcium supplementation. Pt would like a call back or MyChart regarding next steps from  provider with them now knowing that she is not taking Ca. ? ? ? ?Reason for Disposition ? [1] Caller requesting NON-URGENT health information AND [2] PCP's office is the best resource ? ?Answer Assessment - Initial Assessment Questions ?1. REASON FOR CALL or QUESTION: "What is your reason for calling today?" or "How can I best help you?" or "What question do you have that I can help answer?" ?    Per lab note pt was to stop calcium supplementation - pt is not taking any calcium supplementation. Pt would like a call back or MyChart regarding next steps from  provider with them now having the information that she is not taking Ca. ? ?Protocols used: Information Only Call - No Triage-A-AH ? ?

## 2022-01-29 NOTE — Telephone Encounter (Signed)
Patient has been advised. KW 

## 2022-02-14 LAB — CALCIUM, IONIZED: Calcium, Ion: 5.2 mg/dL (ref 4.5–5.6)

## 2022-02-14 LAB — PARATHYROID HORMONE, INTACT (NO CA): PTH: 24 pg/mL (ref 15–65)

## 2022-02-14 LAB — CALCIUM: Calcium: 9.9 mg/dL (ref 8.7–10.3)

## 2022-03-03 ENCOUNTER — Other Ambulatory Visit: Payer: Self-pay | Admitting: Family Medicine

## 2022-03-03 DIAGNOSIS — G4700 Insomnia, unspecified: Secondary | ICD-10-CM

## 2022-03-04 NOTE — Telephone Encounter (Signed)
Requested Prescriptions  ?Pending Prescriptions Disp Refills  ?? traZODone (DESYREL) 100 MG tablet [Pharmacy Med Name: TRAZODONE HCL 100 MG TAB] 45 tablet 5  ?  Sig: TAKE 1 AND 1/2 TABLET BY MOUTH DAILY AT BEDTIME.  ?  ? Psychiatry: Antidepressants - Serotonin Modulator Passed - 03/03/2022 10:26 AM  ?  ?  Passed - Valid encounter within last 6 months  ?  Recent Outpatient Visits   ?      ? 1 month ago Annual physical exam  ? [facility] Jerrol Banana., MD  ? 4 months ago Epigastric pain  ? [facility] Birdie Sons, MD  ? 10 months ago Vitamin D deficiency  ? [facility] Jerrol Banana., MD  ? 1 year ago Annual physical exam  ? [facility] Jerrol Banana., MD  ? 1 year ago Ulcerative pancolitis without complication [facility])  ? [facility] Jerrol Banana., MD  ?  ?  ?Future Appointments   ?        ? In 10 months Jerrol Banana., MD [facility]  ?  ? ?  ?  ?  ? ?

## 2022-05-16 ENCOUNTER — Encounter: Payer: Self-pay | Admitting: Family Medicine

## 2022-07-05 ENCOUNTER — Encounter: Payer: Self-pay | Admitting: Family Medicine

## 2022-07-09 ENCOUNTER — Other Ambulatory Visit: Payer: Self-pay | Admitting: *Deleted

## 2022-07-09 MED ORDER — CIPROFLOXACIN HCL 500 MG PO TABS: 500.0000 mg | ORAL_TABLET | Freq: Two times a day (BID) | ORAL | 0 refills | Status: DC

## 2022-07-14 ENCOUNTER — Encounter: Payer: Self-pay | Admitting: Family Medicine

## 2022-08-04 ENCOUNTER — Other Ambulatory Visit: Payer: Self-pay | Admitting: Family Medicine

## 2022-08-04 DIAGNOSIS — G4700 Insomnia, unspecified: Secondary | ICD-10-CM

## 2022-09-01 ENCOUNTER — Other Ambulatory Visit: Payer: Self-pay | Admitting: Family Medicine

## 2022-09-01 DIAGNOSIS — G4700 Insomnia, unspecified: Secondary | ICD-10-CM

## 2022-09-01 NOTE — Telephone Encounter (Signed)
Medication Refill - Medication: traZODone (DESYREL) 100 MG tablet [[id number]]   Pt reports she has 2 days left  Has the patient contacted their pharmacy? Yes.   (Agent: If no, request that the patient contact the pharmacy for the refill. If patient does not wish to contact the pharmacy document the reason why and proceed with request.) (Agent: If yes, when and what did the pharmacy advise?)  Preferred Pharmacy (with phone number or street name):  [place], [place] - [address]  [place] [address]  Phone: [phone] Fax: [phone]  Hours: Not open 24 hours   Has the patient been seen for an appointment in the last year OR does the patient have an upcoming appointment? Yes.    Agent: Please be advised that RX refills may take up to 3 business days. We ask that you follow-up with your pharmacy.

## 2022-09-02 NOTE — Telephone Encounter (Signed)
last RF 08/04/22 #45 2 RF-  Called [facility] and refills are avaial able- stated med should be ready for pick up in 2 hours. Called pt and advised.    Requested Prescriptions  Refused Prescriptions Disp Refills  . traZODone (DESYREL) 100 MG tablet 45 tablet 2     Psychiatry: Antidepressants - Serotonin Modulator Failed - 09/01/2022 10:22 AM      Failed - Valid encounter within last 6 months    Recent Outpatient Visits          7 months ago Annual physical exam   [facility] Jerrol Banana., MD   10 months ago Epigastric pain   [facility] Birdie Sons, MD   1 year ago Vitamin D deficiency   [facility] Jerrol Banana., MD   1 year ago Annual physical exam   [facility] Jerrol Banana., MD   2 years ago Ulcerative pancolitis without complication [facility])   [facility] Jerrol Banana., MD      Future Appointments            In 4 months Jerrol Banana., MD [facility] [place]

## 2022-10-18 IMAGING — MR MR LUMBAR SPINE W/O CM
5 series · 30 of 48 positions shown · non-contrast
Comparison: CT Abdomen and Pelvis 06/14/2017.

CLINICAL DATA: 59-year-old female with persistent low back pain.
Some improvement with physical therapy over the past 3 months. Left
side pain radiating to the hip and thigh. No known injury.

EXAM:
MRI LUMBAR SPINE WITHOUT CONTRAST
TECHNIQUE: Multiplanar, multisequence MR imaging of the lumbar spine was
performed. No intravenous contrast was administered.

[Series 5: T2 · sagittal · 4.0mm · 0.81mm/px · 6 of 17 slices shown (1 of 2)]
[im 1/17]
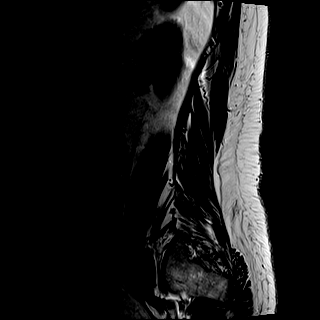
[im 4/17]
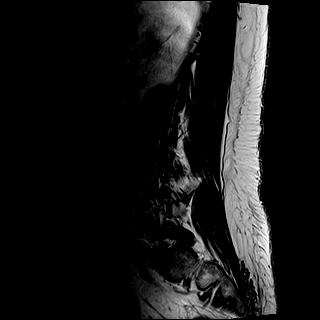
[im 7/17]
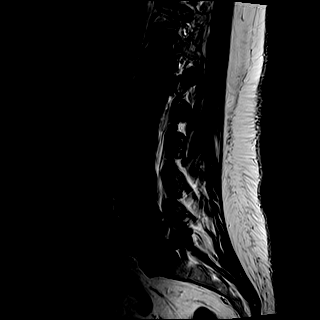
[im 10/17]
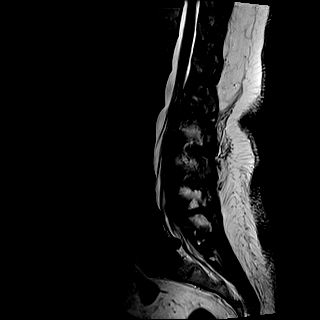
[im 13/17]
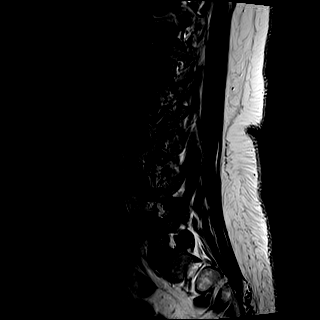
[im 17/17]
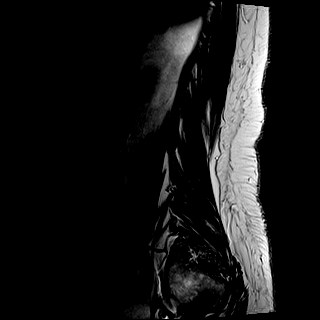

[Series 6: T1 · sagittal · 4.0mm · 0.81mm/px · 7 of 17 slices shown (1 of 2)]
[im 1/17]
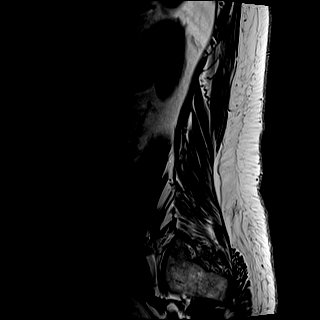
[im 3/17]
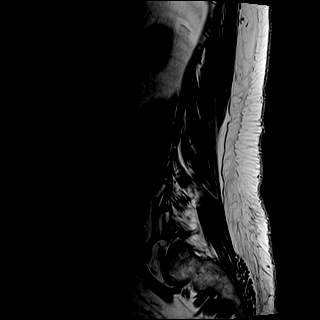
[im 6/17]
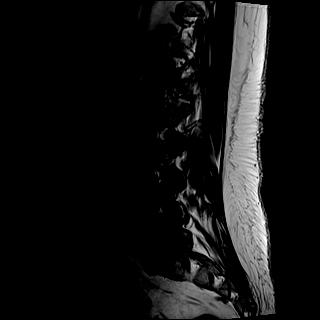
[im 9/17]
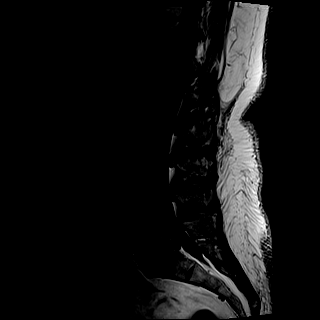
[im 11/17]
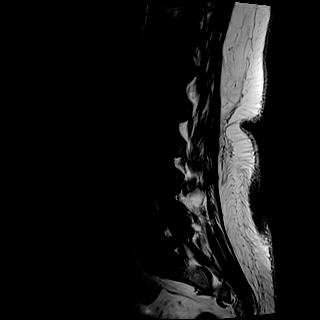
[im 14/17]
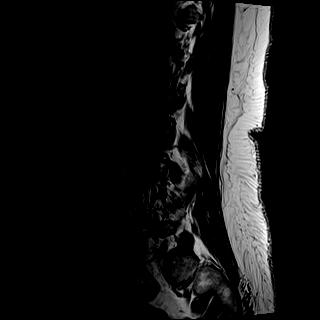
[im 17/17]
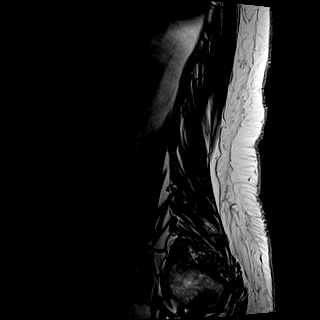

[Series 7: STIR · sagittal · 4.0mm · 0.41mm/px · 1 of 17 slices shown]
[im 1/17]
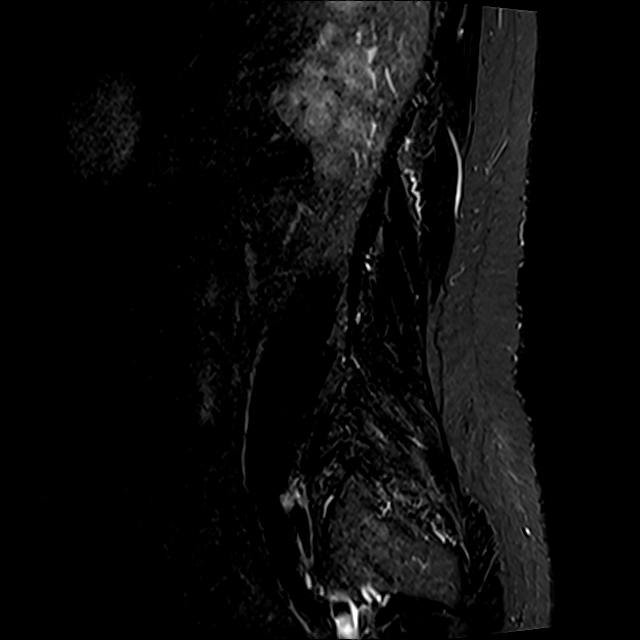

[Series 8: T2 · axial · 4.0mm · 0.78mm/px · z∈[-89,+114]mm · 8 of 35 slices shown (2 of 2)]
[im 1/35]
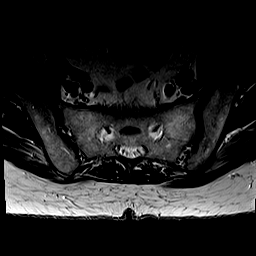
[im 6/35]
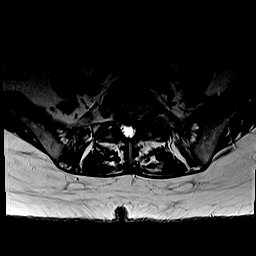
[im 11/35]
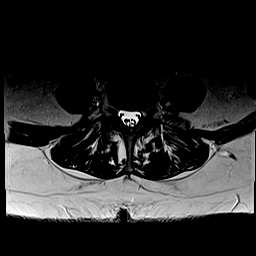
[im 16/35]
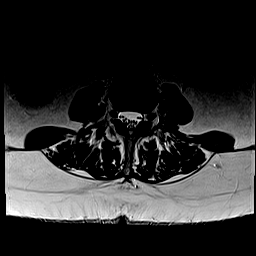
[im 19/35]
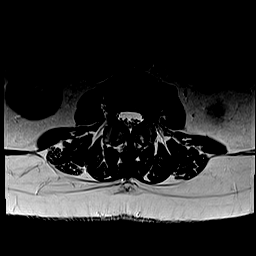
[im 24/35]
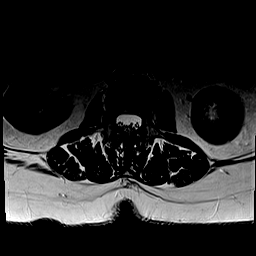
[im 29/35]
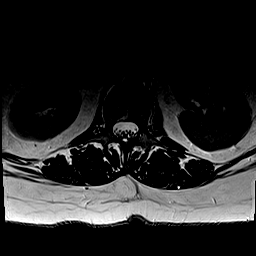
[im 35/35]
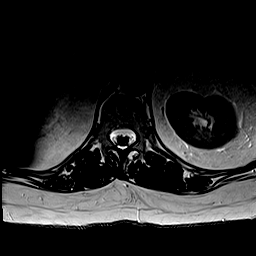

[Series 9: T1 · axial · 4.0mm · 0.39mm/px · z∈[-89,+114]mm · 8 of 35 slices shown (2 of 2)]
[im 1/35]
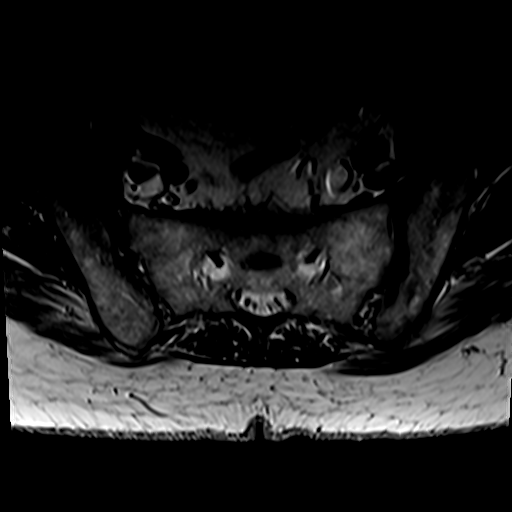
[im 6/35]
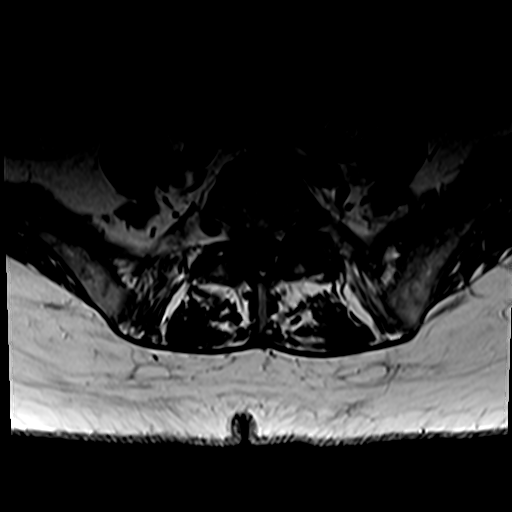
[im 11/35]
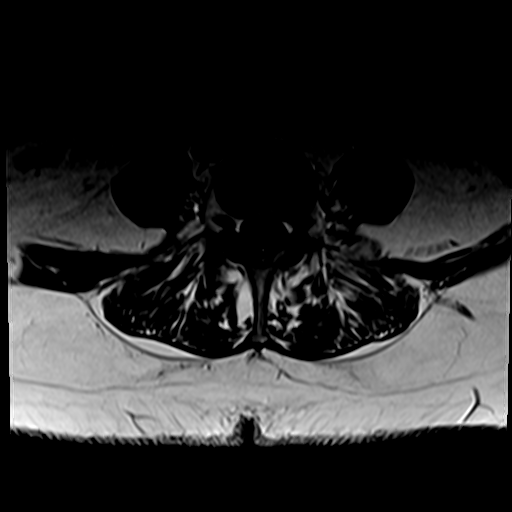
[im 16/35]
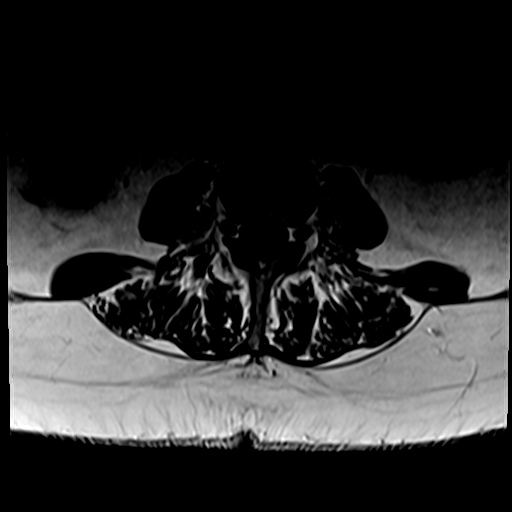
[im 19/35]
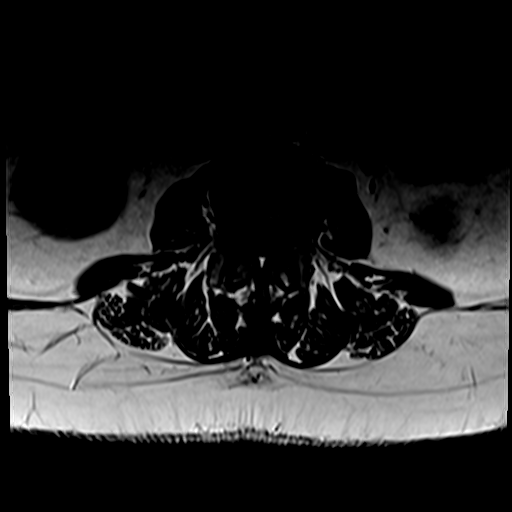
[im 24/35]
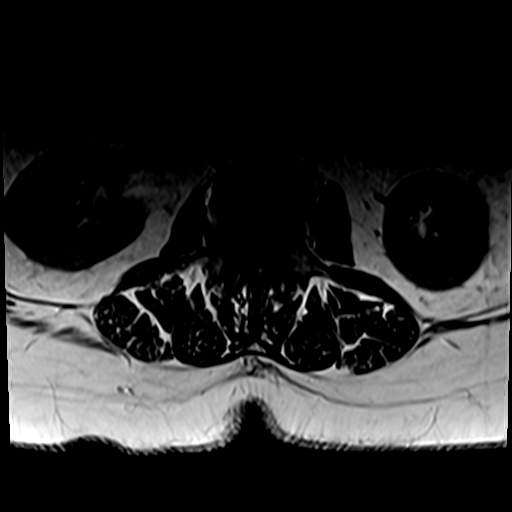
[im 29/35]
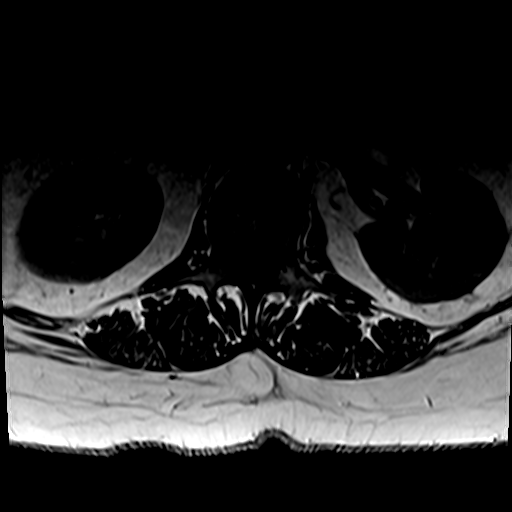
[im 35/35]
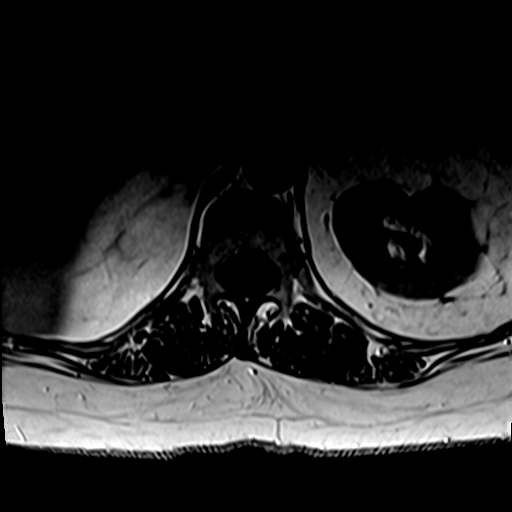

[30 of 48 positions shown; findings below may reference images not displayed]

FINDINGS: Segmentation:  Normal, hypoplastic ribs at T12.

Alignment: Stable lumbar lordosis since 4729. Subtle retrolisthesis
of L1 on L2.

Vertebrae: No marrow edema or evidence of acute osseous abnormality.
Visualized bone marrow signal is within normal limits. Intact
visible sacrum and SI joints.

Conus medullaris and cauda equina: Conus extends to the T12-L1
level. No lower spinal cord or conus signal abnormality.

Paraspinal and other soft tissues: Visualized abdominal viscera and
paraspinal soft tissues are within normal limits.

Disc levels:

Negative visible lower thoracic levels through T12-L1.

L1-L2: Disc desiccation, mild disc space loss and circumferential
disc bulge. No stenosis.

L2-L3:  Negative disc.  Mild facet hypertrophy.  No stenosis.

L3-L4: Mild disc desiccation. Minimal disc bulge. Mild to moderate
facet hypertrophy. No significant stenosis.

L4-L5: Disc desiccation. Mild circumferential disc bulge. Small
superimposed broad-based right paracentral and slightly caudal disc
extrusion (series 8, image 27). Moderate facet and ligament flavum
hypertrophy. Borderline to mild spinal and bilateral lateral recess
stenosis (L5 nerve levels). No convincing foraminal stenosis.

L5-S1: Chronic disc desiccation and vacuum disc. Bulky
circumferential but left eccentric disc bulge or extrusion with
endplate spurring. Moderate left greater than right facet
hypertrophy. No significant spinal stenosis, but moderate left
lateral recess stenosis (left S1 nerve level). Moderate to severe
left L5 foraminal stenosis.
IMPRESSION: 1. Advanced chronic disc and endplate degeneration at L5-S1
eccentric to the left. Subsequent moderate left lateral recess
stenosis and moderate to severe left foraminal stenosis. Query Left
L5 and/or S1 radiculitis.

2. Small, broad-based right paracentral disc herniation at L4-L5
superimposed on disc bulging and posterior element hypertrophy.
Borderline to mild spinal and bilateral lateral recess stenosis.

3. Mild for age lumbar spine degeneration elsewhere.

## 2022-10-28 ENCOUNTER — Telehealth: Payer: Self-pay | Admitting: Family Medicine

## 2022-10-28 NOTE — Telephone Encounter (Signed)
Transition Care Management Unsuccessful Follow-up Telephone Call  Date of discharge and from where:  [facility] on 10/24/22  Attempts:  1st Attempt  Reason for unsuccessful TCM follow-up call:  Left voice message

## 2022-10-31 ENCOUNTER — Telehealth: Payer: Self-pay

## 2022-10-31 ENCOUNTER — Other Ambulatory Visit: Payer: Self-pay | Admitting: Family Medicine

## 2022-10-31 DIAGNOSIS — G4700 Insomnia, unspecified: Secondary | ICD-10-CM

## 2022-10-31 NOTE — Telephone Encounter (Signed)
Transition Care Management Follow-up Telephone Call Date of discharge and from where: 12.22.23 How have you been since you were released from the hospital? [facility] Any questions or concerns? No  Items Reviewed: Did the pt receive and understand the discharge instructions provided? Yes  Medications obtained and verified? Yes  Other? Yes  Any new allergies since your discharge? No  Dietary orders reviewed? Yes Do you have support at home? Yes    Functional Questionnaire: (I = Independent and D = Dependent) ADLs: Yes  Bathing/Dressing- Yes  Meal Prep- yes  Eating- yes  Maintaining continence- yes  Transferring/Ambulation- yes  Managing Meds- yes  Follow up appointments reviewed:  PCP Hospital f/u appt confirmed? [facility] f/u appt confirmed? Yes  Scheduled to see Neuro Sx on 11/07/21 @1030am  Are transportation arrangements needed? No  If their condition worsens, is the pt aware to call PCP or go to the Emergency Dept.? Yes Was the patient provided with contact information for the PCP's office or ED? Yes Was to pt encouraged to call back with questions or concerns? Yes

## 2022-11-13 ENCOUNTER — Ambulatory Visit (INDEPENDENT_AMBULATORY_CARE_PROVIDER_SITE_OTHER): Payer: BLUE CROSS/BLUE SHIELD | Admitting: Family Medicine

## 2022-11-13 ENCOUNTER — Encounter: Payer: Self-pay | Admitting: Family Medicine

## 2022-11-13 VITALS — BP 126/62 | HR 62 | Temp 98.3°F | Ht 61.0 in | Wt 160.0 lb

## 2022-11-13 DIAGNOSIS — J0140 Acute pansinusitis, unspecified: Secondary | ICD-10-CM | POA: Diagnosis not present

## 2022-11-13 DIAGNOSIS — G4700 Insomnia, unspecified: Secondary | ICD-10-CM | POA: Diagnosis not present

## 2022-11-13 MED ORDER — TRAZODONE HCL 100 MG PO TABS: 200.0000 mg | ORAL_TABLET | Freq: Every day | ORAL | 3 refills | Status: AC

## 2022-11-13 MED ORDER — AMOXICILLIN-POT CLAVULANATE 875-125 MG PO TABS: 1.0000 | ORAL_TABLET | Freq: Two times a day (BID) | ORAL | 0 refills | Status: AC

## 2022-11-13 MED ORDER — AZELASTINE HCL 0.1 % NA SOLN: 2.0000 | Freq: Two times a day (BID) | NASAL | 0 refills | Status: DC

## 2022-11-13 NOTE — Assessment & Plan Note (Signed)
Acute, stable Not improved with Claritin and Essential Oils (3 tried) +sinus pain and tenderness LCTAB Reports ongoing sinus congestion, nasal discharge and rhinorrhea  RTC if needed- if s/s don't improve or change

## 2022-11-13 NOTE — Progress Notes (Signed)
Date:  11/13/2022   Name:  Angela Thomas   DOB:  [birth year]   MRN:  [id number]  Patient presents for new patient visit to establish care.  Introduced to Designer, jewellery role and practice setting.  All questions answered.  Discussed provider/patient relationship and expectations.   Chief Complaint: Cough  Cough This is a new problem. Episode onset: 2.5 weeks. The problem has been gradually worsening. The problem occurs constantly. The cough is Productive of sputum (green mucous). Associated symptoms include chills, nasal congestion, postnasal drip and rhinorrhea. Treatments tried: allergy medication, essental oils. The treatment provided mild relief.   "Breathe" Peppermint "Melaluca"  Lab Results  Component Value Date   NA 138 01/15/2022   K 4.6 01/15/2022   CO2 29 01/15/2022   GLUCOSE 90 01/15/2022   BUN 12 01/15/2022   CREATININE 0.65 01/15/2022   CALCIUM 9.9 02/13/2022   EGFR 99 01/15/2022   GFRNONAA 98 07/04/2020   Lab Results  Component Value Date   CHOL 256 (H) 01/15/2022   HDL 79 01/15/2022   LDLCALC 161 (H) 01/15/2022   TRIG 96 01/15/2022   CHOLHDL 3.2 01/15/2022   Lab Results  Component Value Date   TSH 1.100 01/15/2022   Lab Results  Component Value Date   HGBA1C 5.5 01/15/2022   Lab Results  Component Value Date   WBC 8.5 01/15/2022   HGB 14.7 01/15/2022   HCT 42.4 01/15/2022   MCV 96 01/15/2022   PLT 333 01/15/2022   Lab Results  Component Value Date   ALT 18 01/15/2022   AST 14 01/15/2022   ALKPHOS 70 01/15/2022   BILITOT 0.3 01/15/2022   Lab Results  Component Value Date   VD25OH 34.5 04/18/2021     Review of Systems  Constitutional:  Positive for chills.  HENT:  Positive for postnasal drip and rhinorrhea.   Respiratory:  Positive for cough.     Patient Active Problem List   Diagnosis Date Noted   Acute non-recurrent pansinusitis 11/13/2022   Insomnia 11/13/2022   Ulcerative colitis ([place]) 07/13/2017   Corneal scar and  opacity 07/08/2017   Presbyopia 07/08/2017   Myopia 07/08/2017   Hematochezia 06/14/2017   Other fatigue 06/18/2016   Vaginal atrophy 06/18/2016   DDD (degenerative disc disease), lumbar 04/12/2015   Vitamin D deficiency 04/12/2015   Obesity 04/12/2015   Acute anxiety 04/12/2015   Allergic rhinitis 04/12/2015   Fibrocystic breast disease 04/12/2015   Hyperlipidemia 02/18/2013   Arthritis 02/18/2013   Cancer of endometrium ([place]) 08/02/2012    Allergies  Allergen Reactions   Alpha-Gal Anaphylaxis   Beef (Bovine) Protein Other (See Comments) and Anaphylaxis   Beef-Derived Products Anaphylaxis   Other Anaphylaxis    Reaction to red meat and pork products   Pork Allergy Other (See Comments) and Anaphylaxis   Pork-Derived Products Anaphylaxis    Past Surgical History:  Procedure Laterality Date   ABDOMINAL HYSTERECTOMY     total   BREAST CYST ASPIRATION Bilateral    neg   BUNIONECTOMY     BUNIONECTOMY     With hammer toe repair   COLONOSCOPY N/A 06/15/2017   Procedure: COLONOSCOPY;  Surgeon: Lin Landsman, MD;  Location: [place];  Service: Endoscopy;  Laterality: N/A;   CYST REMOVAL NECK     HAMMER TOE SURGERY     TONSILLECTOMY AND ADENOIDECTOMY      Social History   Tobacco Use   Smoking status: Never   Smokeless tobacco: Never  Vaping Use   Vaping Use: Never used  Substance Use Topics   Alcohol use: Yes    Alcohol/week: 1.0 standard drink of alcohol    Types: 1 Glasses of wine per week    Comment: occas   Drug use: No     Medication list has been reviewed and updated.  Current Meds  Medication Sig   acetaminophen (TYLENOL) 500 MG tablet Take by mouth.   amoxicillin-clavulanate (AUGMENTIN) 875-125 MG tablet Take 1 tablet by mouth 2 (two) times daily.   azelastine (ASTELIN) 0.1 % nasal spray Place 2 sprays into both nostrils 2 (two) times daily. Use in each nostril as directed   cycloSPORINE (RESTASIS) 0.05 % ophthalmic emulsion Place 1 drop  into both eyes every 12 (twelve) hours.   diltiazem (CARDIZEM CD) 180 MG 24 hr capsule TAKE 1 CAPSULE BY MOUTH DAILY. PLEASE MAKE YEARLY APPOINTMENT WITH DR.   EPINEPHrine 0.3 mg/0.3 mL IJ SOAJ injection Inject into the muscle.   gabapentin (NEURONTIN) 300 MG capsule Take 300 mg by mouth 3 (three) times daily as needed.   gabapentin (NEURONTIN) 600 MG tablet Take by mouth.   ibuprofen (ADVIL) 200 MG tablet Take by mouth.   Lactobacillus Rhamnosus, GG, (CULTURELLE) CAPS Take 1 capsule by mouth daily.   meloxicam (MOBIC) 15 MG tablet as needed.   methocarbamol (ROBAXIN) 500 MG tablet Take by mouth.   omeprazole (PRILOSEC) 20 MG capsule Take 1 capsule by mouth daily.   valACYclovir (VALTREX) 500 MG tablet TAKE ONE TABLET BY MOUTH TWICE DAILY AS NEEDED   [DISCONTINUED] traZODone (DESYREL) 100 MG tablet TAKE ONE AND ONE-HALF TABLETS AT BEDTIME        No data to display             11/13/2022    2:34 PM 01/06/2022    1:15 PM 10/22/2020    9:16 AM  Depression screen PHQ 2/9  Decreased Interest 0 0 0  Down, Depressed, Hopeless 0 0 0  PHQ - 2 Score 0 0 0  Altered sleeping 0 0 0  Tired, decreased energy 0 0 0  Change in appetite 0 0 0  Feeling bad or failure about yourself  0 0 0  Trouble concentrating 0 0 0  Moving slowly or fidgety/restless 0 0 0  Suicidal thoughts 0 0 0  PHQ-9 Score 0 0 0  Difficult doing work/chores Not difficult at all Not difficult at all Not difficult at all    BP Readings from Last 3 Encounters:  11/13/22 126/62  01/06/22 112/76  10/22/21 129/70    Physical Exam Vitals and nursing note reviewed.  Constitutional:      General: She is not in acute distress.    Appearance: Normal appearance. She is obese. She is not ill-appearing, toxic-appearing or diaphoretic.  HENT:     Head: Normocephalic and atraumatic.     Right Ear: Tympanic membrane, ear canal and external ear normal.     Left Ear: Tympanic membrane, ear canal and external ear normal.     Nose:  Congestion and rhinorrhea present.     Right Sinus: Maxillary sinus tenderness and frontal sinus tenderness present.     Left Sinus: Maxillary sinus tenderness and frontal sinus tenderness present.     Mouth/Throat:     Mouth: Mucous membranes are moist.     Pharynx: Oropharynx is clear. No oropharyngeal exudate or posterior oropharyngeal erythema.  Eyes:     Conjunctiva/sclera: Conjunctivae normal.  Cardiovascular:     Rate  and Rhythm: Normal rate and regular rhythm.     Pulses: Normal pulses.     Heart sounds: Normal heart sounds. No murmur heard.    No friction rub. No gallop.  Pulmonary:     Effort: Pulmonary effort is normal. No respiratory distress.     Breath sounds: Normal breath sounds. No stridor. No wheezing, rhonchi or rales.  Chest:     Chest wall: No tenderness.  Musculoskeletal:        General: No swelling, tenderness, deformity or signs of injury. Normal range of motion.     Cervical back: Neck supple.     Right lower leg: No edema.     Left lower leg: No edema.  Skin:    General: Skin is warm and dry.     Capillary Refill: Capillary refill takes less than 2 seconds.     Coloration: Skin is not jaundiced or pale.     Findings: No bruising, erythema, lesion or rash.  Neurological:     General: No focal deficit present.     Mental Status: She is alert and oriented to person, place, and time. Mental status is at baseline.     Cranial Nerves: No cranial nerve deficit.     Sensory: No sensory deficit.     Motor: No weakness.     Coordination: Coordination normal.  Psychiatric:        Mood and Affect: Mood normal.        Behavior: Behavior normal.        Thought Content: Thought content normal.        Judgment: Judgment normal.     Wt Readings from Last 3 Encounters:  11/13/22 160 lb (72.6 kg)  01/06/22 161 lb (73 kg)  10/22/21 158 lb (71.7 kg)    BP 126/62   Pulse 62   Temp 98.3 F (36.8 C) (Oral)   Ht '5\' 1"'$  (1.549 m)   Wt 160 lb (72.6 kg)   SpO2 100%    BMI 30.23 kg/m   Assessment and Plan:  Problem List Items Addressed This Visit       Respiratory   Acute non-recurrent pansinusitis - Primary    Acute, stable Not improved with Claritin and Essential Oils (3 tried) +sinus pain and tenderness LCTAB Reports ongoing sinus congestion, nasal discharge and rhinorrhea  RTC if needed- if s/s don't improve or change      Relevant Medications   amoxicillin-clavulanate (AUGMENTIN) 875-125 MG tablet   azelastine (ASTELIN) 0.1 % nasal spray     Other   Insomnia    Chronic, worsening Has been using 175 mg 1.75 tablets/night; will increase to 200 mg given possibility for incomplete/irregular breaks      Relevant Medications   traZODone (DESYREL) 100 MG tablet   I, Gwyneth Sprout, FNP, have reviewed all documentation for this visit. The documentation on 11/13/22 for the exam, diagnosis, procedures, and orders are all accurate and complete.

## 2022-11-13 NOTE — Assessment & Plan Note (Signed)
Chronic, worsening Has been using 175 mg 1.75 tablets/night; will increase to 200 mg given possibility for incomplete/irregular breaks

## 2022-11-24 ENCOUNTER — Encounter: Payer: Self-pay | Admitting: Family Medicine

## 2023-01-08 ENCOUNTER — Encounter: Payer: BLUE CROSS/BLUE SHIELD | Admitting: Family Medicine

## 2023-02-28 ENCOUNTER — Other Ambulatory Visit: Payer: Self-pay | Admitting: Family Medicine

## 2023-06-08 ENCOUNTER — Other Ambulatory Visit: Payer: Self-pay | Admitting: Family Medicine

## 2023-06-08 DIAGNOSIS — G4700 Insomnia, unspecified: Secondary | ICD-10-CM

## 2023-06-09 NOTE — Telephone Encounter (Signed)
Pt no longer pt at this practice (sees Dr. Sullivan Lone at [facility])  Requested Prescriptions  Refused Prescriptions Disp Refills   traZODone (DESYREL) 100 MG tablet [Pharmacy Med Name: TRAZODONE HCL 100 MG TAB] 180 tablet 3    Sig: TAKE TWO TABLETS AT BEDTIME     Psychiatry: Antidepressants - Serotonin Modulator Failed - 06/08/2023 10:50 AM      Failed - Valid encounter within last 6 months    Recent Outpatient Visits           6 months ago Acute non-recurrent pansinusitis   [facility] Jacky Kindle, FNP   1 year ago Annual physical exam   [facility] Bosie Clos, MD   1 year ago Epigastric pain   [facility] Malva Limes, MD   2 years ago Vitamin D deficiency   [facility] Bosie Clos, MD   2 years ago Annual physical exam   [facility] Bosie Clos, MD
